# Patient Record
Sex: Male | Born: 1970
Health system: Southern US, Community
[De-identification: ages and names within clinical notes are randomized; demographics above are authoritative.]

## PROBLEM LIST (undated history)

## (undated) DIAGNOSIS — N189 Chronic kidney disease, unspecified: Secondary | ICD-10-CM

## (undated) DIAGNOSIS — F419 Anxiety disorder, unspecified: Secondary | ICD-10-CM

## (undated) DIAGNOSIS — I10 Essential (primary) hypertension: Secondary | ICD-10-CM

## (undated) DIAGNOSIS — T7840XA Allergy, unspecified, initial encounter: Secondary | ICD-10-CM

## (undated) DIAGNOSIS — C801 Malignant (primary) neoplasm, unspecified: Secondary | ICD-10-CM

## (undated) HISTORY — DX: Essential (primary) hypertension: I10

## (undated) HISTORY — DX: Allergy, unspecified, initial encounter: T78.40XA

## (undated) HISTORY — PX: OTHER SURGICAL HISTORY: SHX169

## (undated) NOTE — *Deleted (*Deleted)
NAME:  ADEEB KONECNY, MRN:  [id number], DOB:  [birth year], LOS: 0 ADMISSION DATE:  2020-06-02, CONSULTATION DATE:  *** REFERRING MD:  ***, CHIEF COMPLAINT:  ***   Brief History   ***  History of present illness   ***  Past Medical History  ***  Significant Hospital Events   ***  Consults:  ***  Procedures:  ***  Significant Diagnostic Tests:  ***  Micro Data:  ***  Antimicrobials:  ***   Interim history/subjective:  ***  Objective   Blood pressure (!) 98/41, pulse (!) 107, temperature (!) 93.7 F (34.3 C), resp. rate (!) 30, height 5\' 10"  (1.778 m), weight 101.2 kg, SpO2 100 %.    Vent Mode: PRVC FiO2 (%):  [100 %] 100 % Set Rate:  [30 bmp] 30 bmp Vt Set:  [580 mL] 580 mL PEEP:  [5 cmH20] 5 cmH20 Plateau Pressure:  [18 cmH20] 18 cmH20   Intake/Output Summary (Last 24 hours) at 02-Jun-2020 1812 Last data filed at 06/02/2020 1801 Gross per 24 hour  Intake 1019.6 ml  Output 190 ml  Net 829.6 ml   Filed Weights   02-Jun-2020 1606  Weight: 101.2 kg    Examination: General: *** HENT: *** Lungs: *** Cardiovascular: *** Abdomen: *** Extremities: *** Neuro: *** GU: ***  Resolved Hospital Problem list   ***  Assessment & Plan:  ***  Best practice:  Diet: *** Pain/Anxiety/Delirium protocol (if indicated): *** VAP protocol (if indicated): *** DVT prophylaxis: *** GI prophylaxis: *** Glucose control: *** Mobility: *** Code Status: *** Family Communication: *** Disposition:   Labs   CBC: Recent Labs  Lab 02-Jun-2020 1603 06-02-20 1619  WBC 21.5*  --   NEUTROABS 17.7*  --   HGB 6.8* 7.1*  HCT 24.1* 21.0*  MCV 86.7  --   PLT 65*  --     Basic Metabolic Panel: Recent Labs  Lab 06-02-20 1603 June 02, 2020 1619  NA 136 131*  K >7.5* >8.5*  CL 103  --   CO2 <7*  --   GLUCOSE 130*  --   BUN 140*  --   CREATININE 7.34*  --   CALCIUM 8.5*  --    GFR: Estimated Creatinine Clearance: 14.2 mL/min (A) (by C-G formula based on SCr of  7.34 mg/dL (H)). Recent Labs  Lab 02-Jun-2020 1603  WBC 21.5*  LATICACIDVEN >11.0*    Liver Function Tests: Recent Labs  Lab 2020/06/02 1603  AST 1,773*  ALT 1,932*  ALKPHOS 291*  BILITOT 0.9  PROT 5.6*  ALBUMIN 2.5*   No results for input(s): LIPASE, AMYLASE in the last 168 hours. Recent Labs  Lab 2020-06-02 1634  AMMONIA 84*    ABG    Component Value Date/Time   HCO3 3.6 (L) 06/02/2020 1619   TCO2 <5 (L) 06/02/2020 1619   ACIDBASEDEF 28.0 (H) Jun 02, 2020 1619   O2SAT 80.0 2020/06/02 1619     Coagulation Profile: No results for input(s): INR, PROTIME in the last 168 hours.  Cardiac Enzymes: No results for input(s): CKTOTAL, CKMB, CKMBINDEX, TROPONINI in the last 168 hours.  HbA1C: No results found for: HGBA1C  CBG: Recent Labs  Lab 06-02-20 1604  GLUCAP 95    Review of Systems:   ***  Past Medical History  He,  has a past medical history of Allergy, Anxiety, Cancer (HCC), Chronic kidney disease, and Hypertension.   Surgical History    Past Surgical History:  Procedure Laterality Date  . CYSTOSCOPY N/A 08/26/2019   Procedure:  Derinda Late;  Surgeon: Rene Paci, MD;  Location: [facility];  Service: Urology;  Laterality: N/A;  . ROBOT ASSISTED LAPAROSCOPIC NEPHRECTOMY Left 08/26/2019   Procedure: XI ROBOTIC ASSISTED LAPAROSCOPIC NEPHRECTOMY;  Surgeon: Rene Paci, MD;  Location: [facility];  Service: Urology;  Laterality: Left;  . surgery as a child     thinks matbe was a hernia repair as a premature baby      Social History   reports that he has never smoked. He has never used smokeless tobacco. He reports that he does not drink alcohol and does not use drugs.   Family History   His family history includes Colon polyps in his mother. There is no history of Colon cancer, Esophageal cancer, Prostate cancer, or Rectal cancer.   Allergies Allergies  Allergen Reactions  . Codeine Other (See Comments)    Nose bleeds     Home  Medications  Prior to Admission medications   Medication Sig Start Date End Date Taking? Authorizing Provider  acetaminophen (TYLENOL) 500 MG tablet Take 1,000 mg by mouth every 6 (six) hours as needed for mild pain.    [provider]  amLODipine (NORVASC) 10 MG tablet Take 1 tablet (10 mg total) by mouth daily. 11/23/19 06/15/20  Georgina Quint, MD  dexamethasone (DECADRON) 4 MG tablet Take 1 tablet (4 mg total) by mouth every 6 (six) hours. 04/23/20   Azucena Fallen, MD  lisinopril-hydrochlorothiazide (ZESTORETIC) 20-25 MG tablet TAKE 1 TABLET BY MOUTH DAILY 10/31/19   Georgina Quint, MD  metoprolol succinate (TOPROL-XL) 100 MG 24 hr tablet Take 1 tablet (100 mg total) by mouth daily. Take with or immediately following a meal. 11/23/19 04/22/20  Georgina Quint, MD  Multiple Vitamin (MULTIVITAMIN) LIQD Take 5 mLs by mouth daily.     [provider]  omeprazole (PRILOSEC) 20 MG capsule Take 1 capsule (20 mg total) by mouth daily. 04/26/20   Lonie Peak, MD     Critical care time: ***

---

## 1998-09-28 ENCOUNTER — Emergency Department (HOSPITAL_COMMUNITY): Admission: EM | Admit: 1998-09-28 | Discharge: 1998-09-28 | Payer: Self-pay

## 1998-11-03 ENCOUNTER — Encounter: Admission: RE | Admit: 1998-11-03 | Discharge: 1998-11-16 | Payer: Self-pay | Admitting: *Deleted

## 2018-11-14 ENCOUNTER — Emergency Department (HOSPITAL_COMMUNITY): Payer: BLUE CROSS/BLUE SHIELD

## 2018-11-14 ENCOUNTER — Other Ambulatory Visit: Payer: Self-pay

## 2018-11-14 ENCOUNTER — Encounter (HOSPITAL_COMMUNITY): Payer: Self-pay

## 2018-11-14 ENCOUNTER — Emergency Department (HOSPITAL_COMMUNITY)
Admission: EM | Admit: 2018-11-14 | Discharge: 2018-11-14 | Disposition: A | Discharge status: Home / Self Care | Payer: BLUE CROSS/BLUE SHIELD | Attending: Emergency Medicine | Admitting: Emergency Medicine

## 2018-11-14 DIAGNOSIS — R319 Hematuria, unspecified: Secondary | ICD-10-CM | POA: Diagnosis not present

## 2018-11-14 DIAGNOSIS — N059 Unspecified nephritic syndrome with unspecified morphologic changes: Secondary | ICD-10-CM | POA: Diagnosis not present

## 2018-11-14 DIAGNOSIS — N281 Cyst of kidney, acquired: Secondary | ICD-10-CM | POA: Diagnosis not present

## 2018-11-14 DIAGNOSIS — I10 Essential (primary) hypertension: Secondary | ICD-10-CM

## 2018-11-14 DIAGNOSIS — R030 Elevated blood-pressure reading, without diagnosis of hypertension: Secondary | ICD-10-CM | POA: Diagnosis not present

## 2018-11-14 DIAGNOSIS — N009 Acute nephritic syndrome with unspecified morphologic changes: Secondary | ICD-10-CM | POA: Diagnosis not present

## 2018-11-14 LAB — URINALYSIS, ROUTINE W REFLEX MICROSCOPIC
Bilirubin Urine: NEGATIVE
Glucose, UA: NEGATIVE mg/dL
Ketones, ur: NEGATIVE mg/dL
Leukocytes,Ua: NEGATIVE
Nitrite: NEGATIVE
Protein, ur: 100 mg/dL — AB
RBC / HPF: >50 RBC/hpf — ABNORMAL HIGH (ref 0–5)
Specific Gravity, Urine: 1.02 (ref 1.005–1.030)
pH: 6 (ref 5.0–8.0)

## 2018-11-14 LAB — CBC
HCT: 44.8 % (ref 39.0–52.0)
Hemoglobin: 14.3 g/dL (ref 13.0–17.0)
MCH: 26.8 pg (ref 26.0–34.0)
MCHC: 31.9 g/dL (ref 30.0–36.0)
MCV: 83.9 fL (ref 80.0–100.0)
Platelets: 361 10*3/uL (ref 150–400)
RBC: 5.34 MIL/uL (ref 4.22–5.81)
RDW: 14.2 % (ref 11.5–15.5)
WBC: 8.2 10*3/uL (ref 4.0–10.5)
nRBC: 0 % (ref 0.0–0.2)

## 2018-11-14 LAB — BASIC METABOLIC PANEL
Anion gap: 12 (ref 5–15)
BUN: 12 mg/dL (ref 6–20)
CO2: 22 mmol/L (ref 22–32)
Calcium: 9.6 mg/dL (ref 8.9–10.3)
Chloride: 103 mmol/L (ref 98–111)
Creatinine, Ser: 1.27 mg/dL — ABNORMAL HIGH (ref 0.61–1.24)
GFR calc Af Amer: >60 mL/min (ref >60)
GFR calc non Af Amer: >60 mL/min (ref >60)
Glucose, Bld: 104 mg/dL — ABNORMAL HIGH (ref 70–99)
Potassium: 3.9 mmol/L (ref 3.5–5.1)
Sodium: 137 mmol/L (ref 135–145)

## 2018-11-14 LAB — TROPONIN I: Troponin I: <0.03 ng/mL (ref <0.03)

## 2018-11-14 IMAGING — DX PORTABLE CHEST - 1 VIEW
1 series · 1 of 1 positions shown · non-contrast
Comparison: None.

CLINICAL DATA: Hypertension

EXAM:
PORTABLE CHEST 1 VIEW

[chest ap]
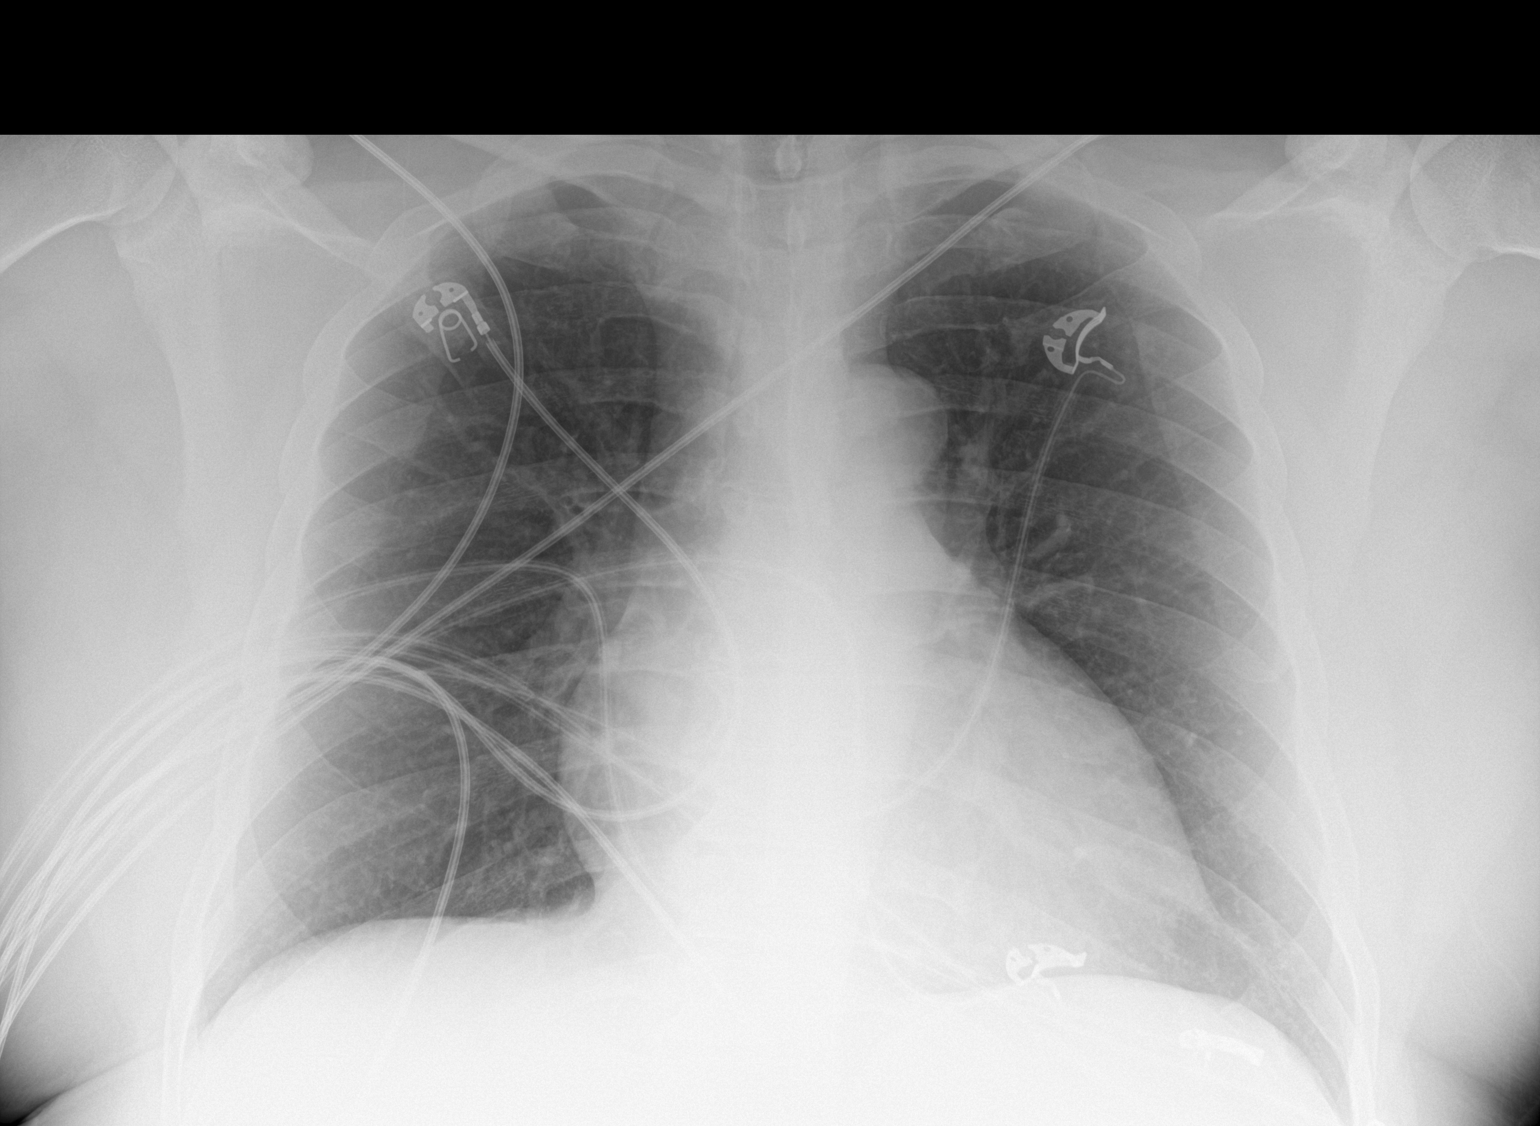

[1 of 1 positions shown; findings below may reference images not displayed]

FINDINGS: The heart size and mediastinal contours are within normal limits.
Both lungs are clear. The visualized skeletal structures are
unremarkable.
IMPRESSION: No active disease.

## 2018-11-14 IMAGING — US US RENAL
1 series · 14 of 25 positions shown · non-contrast
Comparison: None.

CLINICAL DATA: Hypertension and hematuria.

EXAM:
RENAL / URINARY TRACT ULTRASOUND COMPLETE

[Series 1: us renal · 42 acquisitions, 14 frames shown]
[im 1/42]
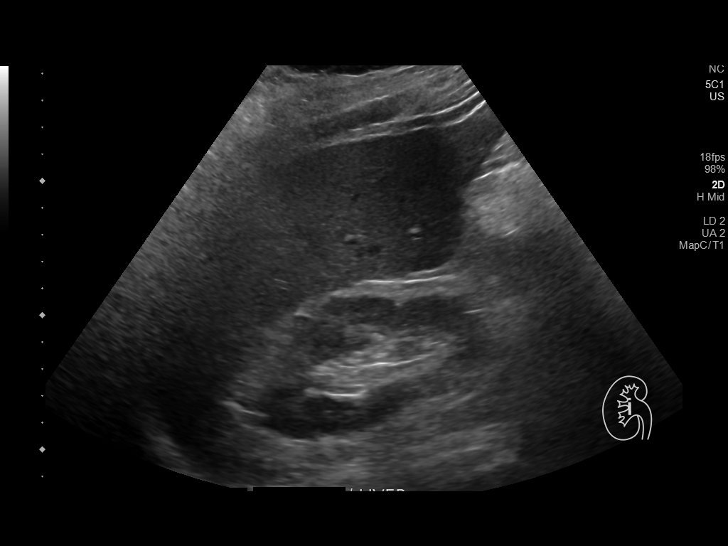
[im 4/42]
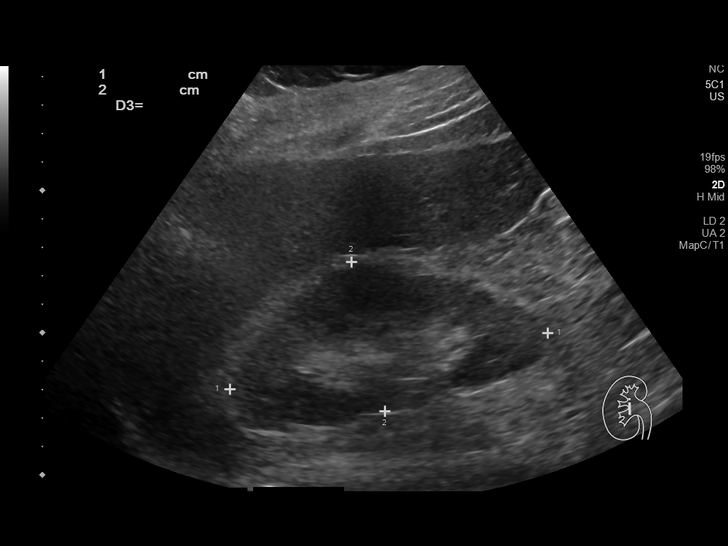
[im 7/42]
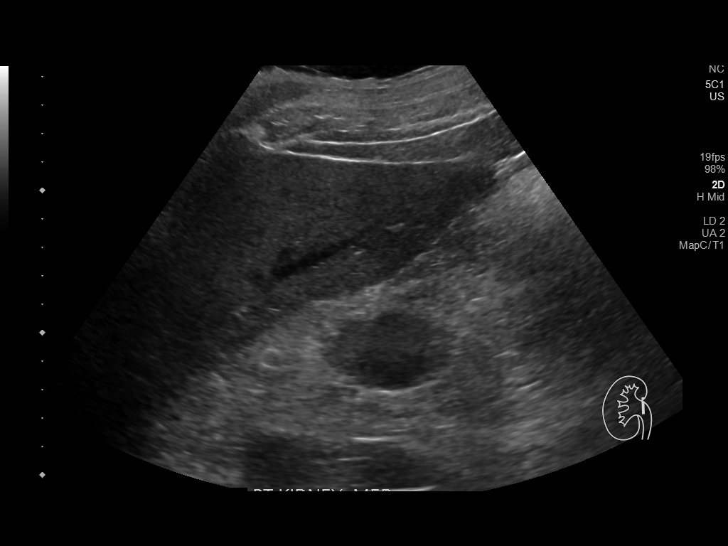
[im 11/42]
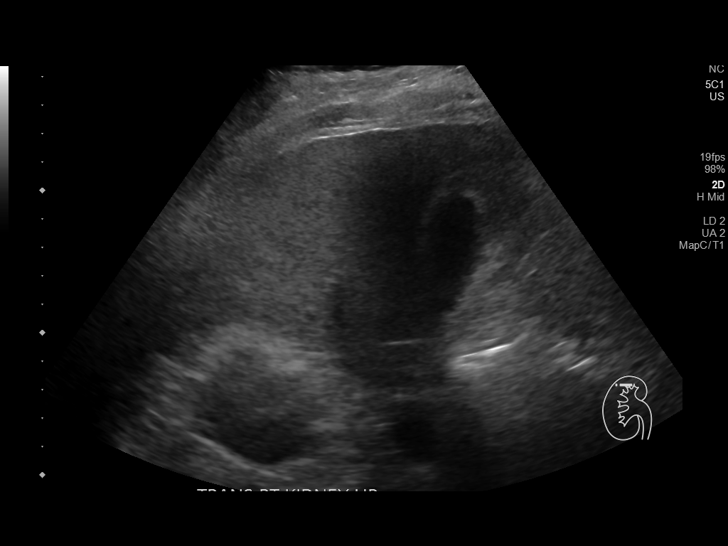
[im 14/42]
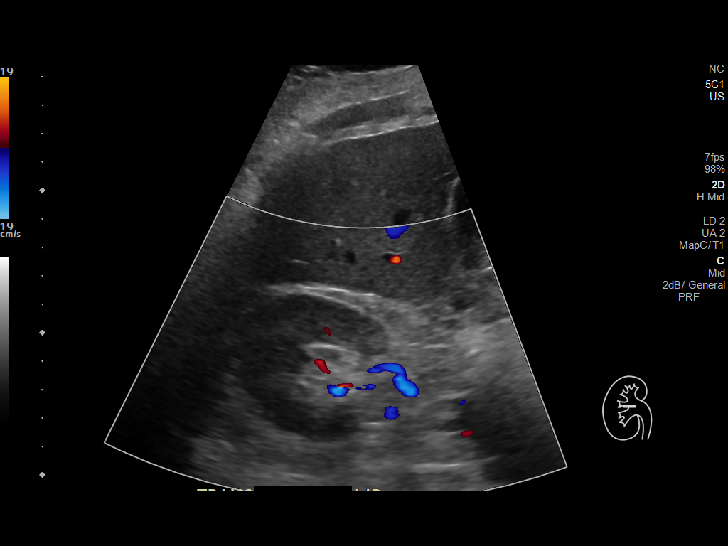
[im 16/42]
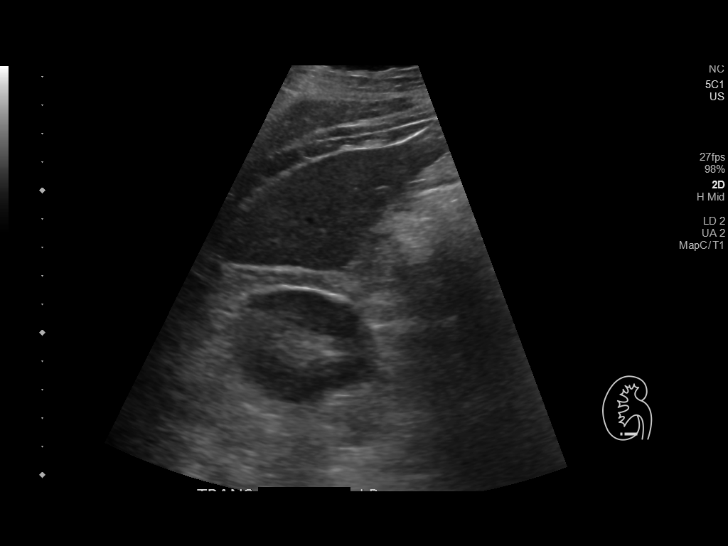
[im 19/42]
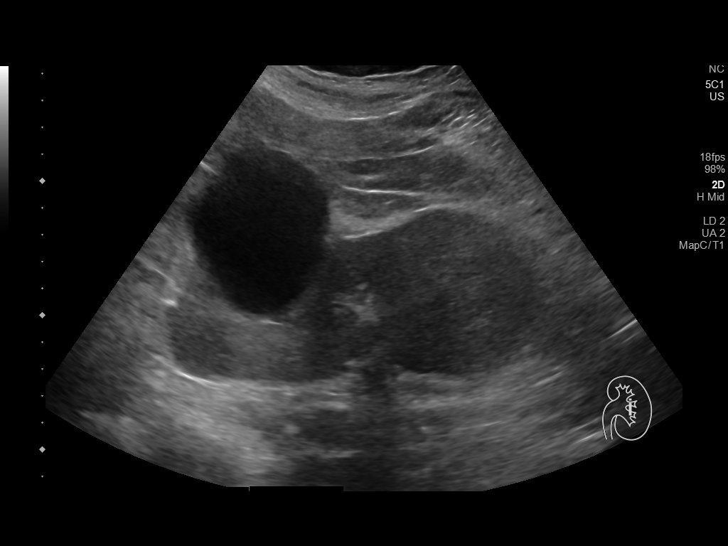
[im 23/42]
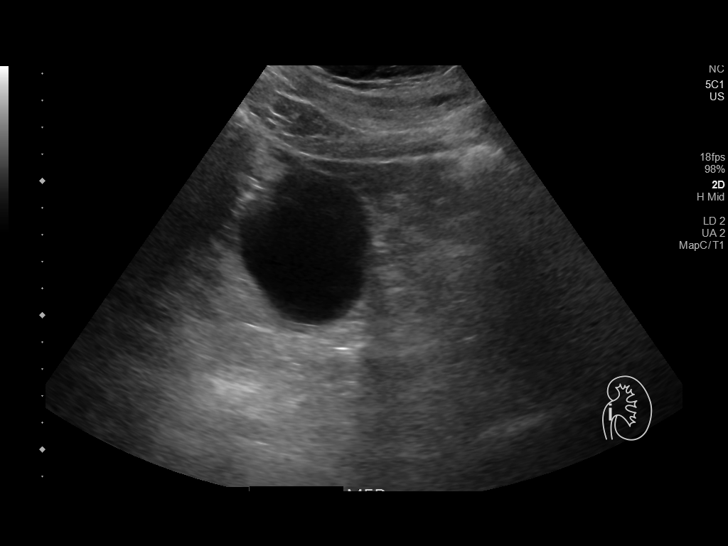
[im 26/42]
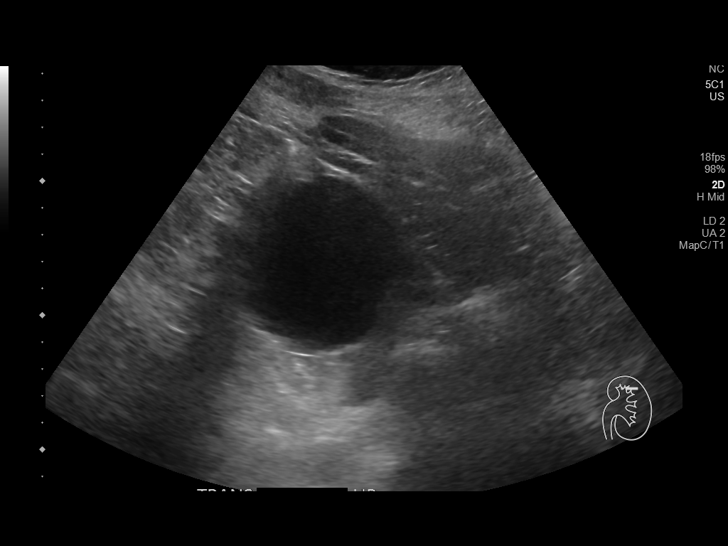
[im 28/42]
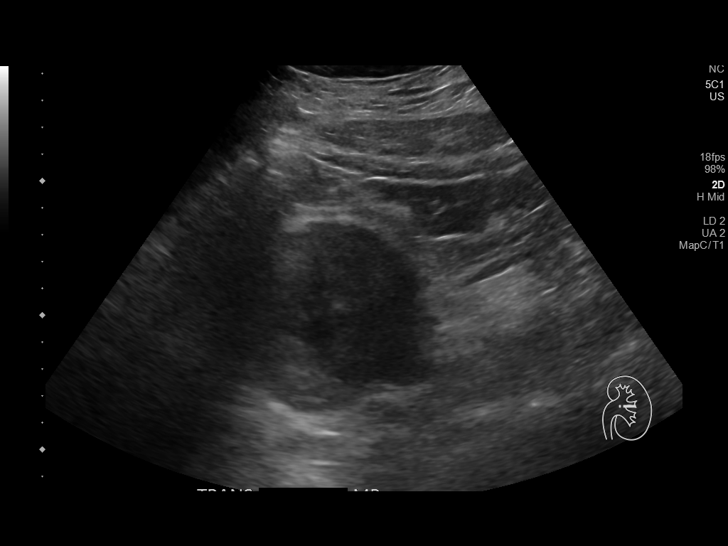
[im 31/42]
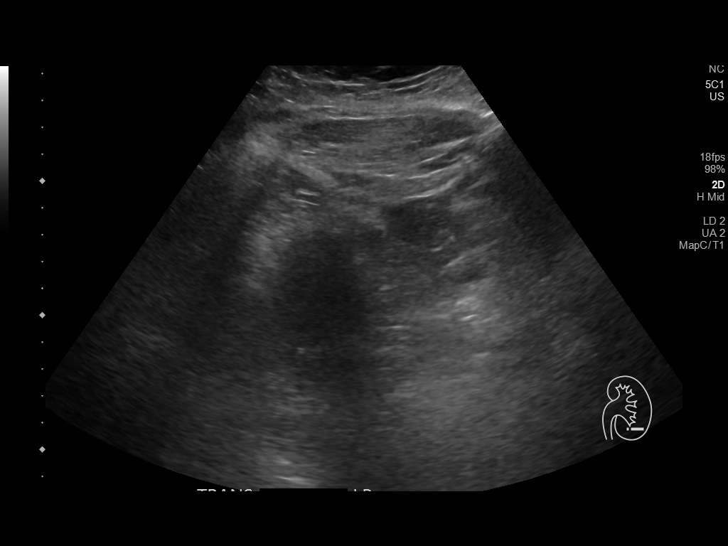
[im 35/42]
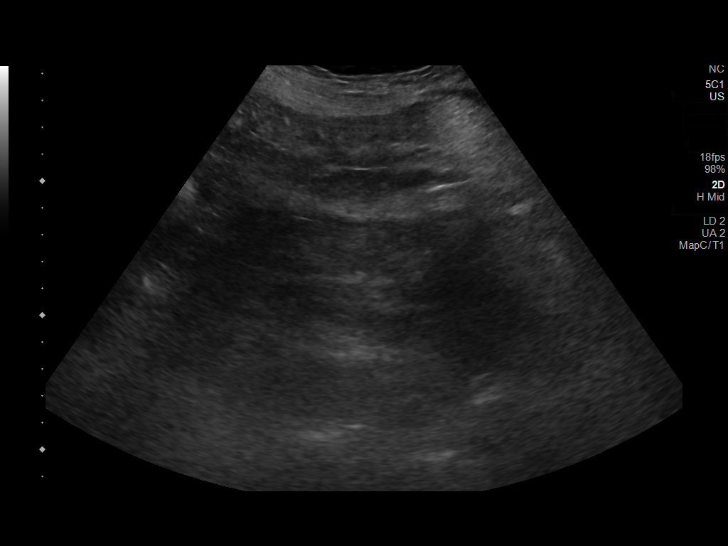
[im 38/42]
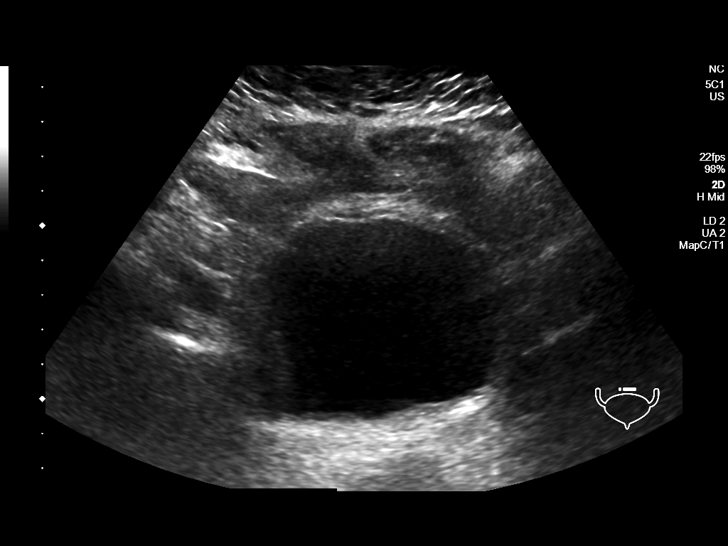
[im 42/42]
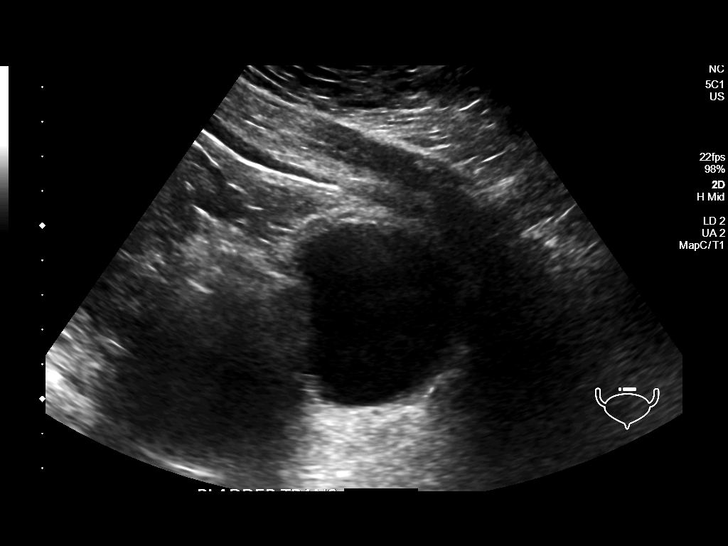

[14 of 25 positions shown; findings below may reference images not displayed]

FINDINGS: Right Kidney:

Renal measurements: 11.3 x 5.4 x 5.4 cm = volume: 172 mL .
Echogenicity within normal limits. No mass or hydronephrosis
visualized.

Left Kidney:

Renal measurements: 14.2 x 5.1 x 6.6 cm = volume: 249 mL. Normal
echogenicity. No mass or hydronephrosis. 6 cm cyst at the upper
pole.

Bladder:

Appears normal for degree of bladder distention.
IMPRESSION: No cause of hematuria identified. Normal size kidneys. No
hydronephrosis. 6 cm simple cyst at the upper pole the left kidney.

## 2018-11-14 MED ORDER — LABETALOL HCL 100 MG PO TABS: 100.0000 mg | ORAL_TABLET | Freq: Two times a day (BID) | ORAL | 0 refills | Status: DC

## 2018-11-14 MED ORDER — LABETALOL HCL 5 MG/ML IV SOLN
20.0000 mg | Freq: Once | INTRAVENOUS | Status: AC
  Administered 2018-11-14: 12:00:00 20 mg via INTRAVENOUS
  Filled 2018-11-14: qty 4

## 2018-11-14 MED ORDER — HYDROCHLOROTHIAZIDE 25 MG PO TABS: 25.0000 mg | ORAL_TABLET | Freq: Every day | ORAL | 0 refills | Status: DC

## 2018-11-14 NOTE — ED Provider Notes (Signed)
CC hematuria, found to have HTN at outside office. Given Labetalol for BP with improvement. Nephrology consulted for nephritic syndrome, requested [place] for OP follow up unless patient has hydronephrosis or needs admission. Rx for Lisinopril/HCTZ  Physical Exam  BP (!) 193/112   Pulse 84   Temp 98.6 F (37 C) (Oral)   Resp 19   SpO2 100%   Physical Exam  ED Course/Procedures   Clinical Course as of Nov 14 1614  Fri Nov 14, 2018  1408 Discussed with Dr Moshe Cipro.  Will check a renal ultrasound.  Pt should be able to go home on BP meds and close follow up   [JK]    Clinical Course User Index [JK] Dorie Rank, MD    Procedures  MDM  [place] with renal cyst, no hydronephrosis. Discussed results with patient, patient is scheduled for follow up on 5/19, he is aware of this appointment. Patient to take BP meds as prescribed, has a BP cuff at home to monitor his BP, record results and take to follow up appointment.       Tacy Learn, PA-C 11/14/18 1616    Dorie Rank, MD 11/16/18 1537

## 2018-11-14 NOTE — TOC Transition Note (Addendum)
Transition of Care [facility]) - CM/SW Discharge Note   Patient Details  Name: Marcus Callahan MRN: [id number] Date of Birth: 1971/03/11  [facility] CM/SW Contact:  Erenest Rasher, RN Phone Number: 11/14/2018, 12:43 PM   Clinical Narrative:   [facility] TOC CM -referral PCP, HTN  Spoke to pt and states he does not have a PCP, states he was going to Primary Care at [facility] but sent to come to [facility] ED due to elevated BP. Arranged appt with Primary Care at [facility] for 11/18/2018 at 9:20 am. Discussed with patient diet, blood pressure cuff, medications, exercise, and scale. Encouraged him to check with MD about exercising. States he walks daily. Pt will purchase blood pressure cuff and scale. Explained to report his numbers to his PCP at his appt. Explained importance of taking meds as prescribed and reporting any side effects to his physician.   Final next level of care: Home/Self Care Barriers to Discharge: No Barriers Identified   Patient Goals and CMS Choice        Discharge Placement                       Discharge Plan and Services    PCP appointment                                 Social Determinants of Health (SDOH) Interventions     Readmission Risk Interventions No flowsheet data found.

## 2018-11-14 NOTE — ED Notes (Signed)
Case Manager @ bedside.

## 2018-11-14 NOTE — ED Notes (Signed)
ED Provider at bedside. 

## 2018-11-14 NOTE — ED Notes (Signed)
Xray bedside.

## 2018-11-14 NOTE — ED Notes (Signed)
Ultrasound bedside.

## 2018-11-14 NOTE — ED Triage Notes (Signed)
Pt went to Urgent Care clinic, was told to come to ED for elevated BP and hematuria. Pt states today is the first time he has seen blood in urine, denies painful urination.

## 2018-11-14 NOTE — Discharge Instructions (Addendum)
Follow up with Nephrology as scheduled. Return to ER for new or worsening symptoms. Take Labetalol and HCTZ as prescribed for blood pressure control. Check your blood pressure three times daily, record results and take with you to your follow up appointment.

## 2018-11-14 NOTE — ED Provider Notes (Signed)
[place] DEPT Provider Note   CSN: [id number] Arrival date & time: 11/14/18  1118    History   Chief Complaint Chief Complaint  Patient presents with  . Hematuria  . Hypertension    HPI Marcus Callahan is a 48 y.o. male.     HPI Patient presents to the emergency room for evaluation of hypertension and hematuria.  Initially noticed that he had blood in his urine this morning.  Patient states he was at work today and when he went to urinate at the bathroom he noticed the blood.  He denies having any other symptoms.  He is not had any pain with urination.  He denies any back pain or abdominal pain.  He is never had any issues like this before.  He went to an urgent care to have that evaluated.  While he was there they took his blood pressure and noted he was extremely hypertensive.  His blood pressure was over 742 systolic and over 595 systolic.  They recommended he come to the ED for further evaluation.  Patient denies having any trouble with chest pain or shortness of breath.  No trouble with headache or blurred vision.  He states he has been told this past his blood pressure has been high but he has never had a primary care doctor and has never been on any medications. History reviewed. No pertinent past medical history.  There are no active problems to display for this patient.   History reviewed. No pertinent surgical history.      Home Medications    Prior to Admission medications   Not on File    Family History History reviewed. No pertinent family history.  Social History Social History   Tobacco Use  . Smoking status: Not on file  Substance Use Topics  . Alcohol use: Not on file  . Drug use: Not on file     Allergies   Codeine   Review of Systems Review of Systems  All other systems reviewed and are negative.    Physical Exam Updated Vital Signs BP (!) 222/131   Pulse 95   Temp 98.6 F (37 C) (Oral)   Resp  18   SpO2 100%   Physical Exam Vitals signs and nursing note reviewed.  Constitutional:      General: He is not in acute distress.    Appearance: He is well-developed.  HENT:     Head: Normocephalic and atraumatic.     Right Ear: External ear normal.     Left Ear: External ear normal.  Eyes:     General: No scleral icterus.       Right eye: No discharge.        Left eye: No discharge.     Conjunctiva/sclera: Conjunctivae normal.  Neck:     Musculoskeletal: Neck supple.     Trachea: No tracheal deviation.  Cardiovascular:     Rate and Rhythm: Normal rate and regular rhythm.  Pulmonary:     Effort: Pulmonary effort is normal. No respiratory distress.     Breath sounds: Normal breath sounds. No stridor. No wheezing or rales.  Abdominal:     General: Bowel sounds are normal. There is no distension.     Palpations: Abdomen is soft.     Tenderness: There is no abdominal tenderness. There is no guarding or rebound.  Musculoskeletal:        General: No tenderness.  Skin:    General: Skin is warm  and dry.     Findings: No rash.  Neurological:     Mental Status: He is alert.     Cranial Nerves: No cranial nerve deficit (no facial droop, extraocular movements intact, no slurred speech).     Sensory: No sensory deficit.     Motor: No abnormal muscle tone or seizure activity.     Coordination: Coordination normal.      ED Treatments / Results  Labs (all labs ordered are listed, but only abnormal results are displayed) Labs Reviewed  URINALYSIS, ROUTINE W REFLEX MICROSCOPIC  CBC  BASIC METABOLIC PANEL  TROPONIN I    EKG EKG Interpretation  Date/Time:  Friday Nov 14 2018 11:45:43 EDT Ventricular Rate:  86 PR Interval:    QRS Duration: 101 QT Interval:  397 QTC Calculation: 475 R Axis:   -37 Text Interpretation:  Sinus rhythm Probable left atrial enlargement Left axis deviation No old tracing to compare Confirmed by Dorie Rank [phone]) on 11/14/2018 11:59:42 AM Also  confirmed by Dorie Rank [phone]), editor Philomena Doheny [phone])  on 11/15/2018 7:06:31 AM   Radiology US Renal  Result Date: 11/14/2018 CLINICAL DATA:  Hypertension and hematuria. EXAM: RENAL / URINARY TRACT ULTRASOUND COMPLETE COMPARISON:  None. FINDINGS: Right Kidney: Renal measurements: 11.3 x 5.4 x 5.4 cm = volume: 172 mL . Echogenicity within normal limits. No mass or hydronephrosis visualized. Left Kidney: Renal measurements: 14.2 x 5.1 x 6.6 cm = volume: 249 mL. Normal echogenicity. No mass or hydronephrosis. 6 cm cyst at the upper pole. Bladder: Appears normal for degree of bladder distention. IMPRESSION: No cause of hematuria identified. Normal size kidneys. No hydronephrosis. 6 cm simple cyst at the upper pole the left kidney. Electronically Signed   By: Nelson Chimes M.D.   On: 11/14/2018 15:50    Procedures Procedures (including critical care time)  Medications Ordered in ED Medications - No data to display   Initial Impression / Assessment and Plan / ED Course  I have reviewed the triage vital signs and the nursing notes.  Pertinent labs & imaging results that were available during my care of the patient were reviewed by me and considered in my medical decision making (see chart for details).  Clinical Course as of Nov 16 1527  Fri Nov 14, 2018  1408 Discussed with Dr Moshe Cipro.  Will check a renal ultrasound.  Pt should be able to go home on BP meds and close follow up   [JK]    Clinical Course User Index [JK] Dorie Rank, MD    Pt presented with HTN and hematuria.  No pain, likely medical etiology. Nephritic syndrome a concern.  BP improved with treatment in the ED.  Plan on DC home with BP medications and outpt follow up.    Final Clinical Impressions(s) / ED Diagnoses   Final diagnoses:  Nephritic syndrome  Hypertension, unspecified type    ED Discharge Orders         Ordered    labetalol (NORMODYNE) 100 MG tablet  2 times daily     11/14/18 1607     hydrochlorothiazide (HYDRODIURIL) 25 MG tablet  Daily     11/14/18 1607           Dorie Rank, MD 11/16/18 1530

## 2018-11-18 ENCOUNTER — Ambulatory Visit: Payer: BLUE CROSS/BLUE SHIELD | Admitting: Emergency Medicine

## 2018-11-18 ENCOUNTER — Encounter: Payer: Self-pay | Admitting: Emergency Medicine

## 2018-11-18 ENCOUNTER — Other Ambulatory Visit: Payer: Self-pay

## 2018-11-18 VITALS — BP 140/94 | HR 79 | Temp 98.8°F | Resp 16 | Ht 69.0 in | Wt 247.8 lb

## 2018-11-18 DIAGNOSIS — R319 Hematuria, unspecified: Secondary | ICD-10-CM

## 2018-11-18 DIAGNOSIS — Z23 Encounter for immunization: Secondary | ICD-10-CM | POA: Diagnosis not present

## 2018-11-18 DIAGNOSIS — I10 Essential (primary) hypertension: Secondary | ICD-10-CM | POA: Diagnosis not present

## 2018-11-18 DIAGNOSIS — Z1211 Encounter for screening for malignant neoplasm of colon: Secondary | ICD-10-CM

## 2018-11-18 LAB — POCT URINALYSIS DIP (MANUAL ENTRY)
Bilirubin, UA: NEGATIVE
Glucose, UA: NEGATIVE mg/dL
Ketones, POC UA: NEGATIVE mg/dL
Leukocytes, UA: NEGATIVE
Nitrite, UA: NEGATIVE
Protein Ur, POC: 30 mg/dL — AB
Spec Grav, UA: 1.025 (ref 1.010–1.025)
Urobilinogen, UA: 0.2 E.U./dL
pH, UA: 5.5 (ref 5.0–8.0)

## 2018-11-18 MED ORDER — LISINOPRIL-HYDROCHLOROTHIAZIDE 20-25 MG PO TABS: 1.0000 | ORAL_TABLET | Freq: Every day | ORAL | 3 refills | Status: DC

## 2018-11-18 NOTE — Assessment & Plan Note (Signed)
Stop hydrochlorothiazide start lisinopril-hydrochlorothiazide 20-25 mg daily.  Continue labetalol 100 mg twice a day. DASHDiet and exercise recommended. Follow-up in 4 weeks.

## 2018-11-18 NOTE — Progress Notes (Signed)
11/14/2018 ED visit: CC hematuria, found to have HTN at outside office. Given Labetalol for BP with improvement. Nephrology consulted for nephritic syndrome, requested [place] for OP follow up unless patient has hydronephrosis or needs admission. Rx for Lisinopril/HCTZ  [place] with renal cyst, no hydronephrosis. Discussed results with patient, patient is scheduled for follow up on 5/19, he is aware of this appointment. Patient to take BP meds as prescribed, has a BP cuff at home to monitor his BP, record results and take to follow up appointment BP Readings from Last 3 Encounters:  11/18/18 (!) 158/112  11/14/18 (!) 193/112  US Renal  Result Date: 11/14/2018 CLINICAL DATA:  Hypertension and hematuria. EXAM: RENAL / URINARY TRACT ULTRASOUND COMPLETE COMPARISON:  None. FINDINGS: Right Kidney: Renal measurements: 11.3 x 5.4 x 5.4 cm = volume: 172 mL . Echogenicity within normal limits. No mass or hydronephrosis visualized. Left Kidney: Renal measurements: 14.2 x 5.1 x 6.6 cm = volume: 249 mL. Normal echogenicity. No mass or hydronephrosis. 6 cm cyst at the upper pole. Bladder: Appears normal for degree of bladder distention. IMPRESSION: No cause of hematuria identified. Normal size kidneys. No hydronephrosis. 6 cm simple cyst at the upper pole the left kidney. Electronically Signed   By: Nelson Chimes M.D.   On: 11/14/2018 15:50   Dg Chest Portable 1 View  Result Date: 11/14/2018 CLINICAL DATA:  Hypertension EXAM: PORTABLE CHEST 1 VIEW COMPARISON:  None. FINDINGS: The heart size and mediastinal contours are within normal limits. Both lungs are clear. The visualized skeletal structures are unremarkable. IMPRESSION: No active disease. Electronically Signed   By: Inez Catalina M.D.   On: 11/14/2018 12:17        Lab Results  Component Value Date   CREATININE 1.27 (H) 11/14/2018   BUN 12 11/14/2018   NA 137 11/14/2018   K 3.9 11/14/2018   CL 103 11/14/2018   CO2 22 11/14/2018   GFR calc Af Amer >60 mL/min  >60   Marcus Callahan 48 y.o.   Chief Complaint  Patient presents with  . Establish Care  . Hypertension    follow up after ED visit [facility] 11/14/2018 for htn and blood in urine    HISTORY OF PRESENT ILLNESS: This is a 48 y.o. male here to establish care with me.  Recently seen in the emergency room on 11/14/2018 for hematuria and uncontrolled hypertension.  Normal kidney function test with normal GFR.  Unremarkable ultrasound of his kidneys.  Started on labetalol 100 mg twice a day and hydrochlorothiazide 25 mg a day.  Blood pressures at home have been 170/115 average.  Asymptomatic.  Denies headache, syncope, chest pain, difficulty breathing, nausea or vomiting, blurred vision, or any other significant symptoms.  Denies chest pain on exertion.  Denies dyspnea on exertion.  No symptoms of angina. Wt Readings from Last 3 Encounters:  11/18/18 247 lb 12.8 oz (112.4 kg)     HPI   Prior to Admission medications   Medication Sig Start Date End Date Taking? Authorizing Provider  hydrochlorothiazide (HYDRODIURIL) 25 MG tablet Take 1 tablet (25 mg total) by mouth daily for 30 days. 11/14/18 12/14/18 Yes Tacy Learn, PA-C  labetalol (NORMODYNE) 100 MG tablet Take 1 tablet (100 mg total) by mouth 2 (two) times daily for 30 days. 11/14/18 12/14/18 Yes Tacy Learn, PA-C    Allergies  Allergen Reactions  . Codeine Other (See Comments)    Nose bleeds    There are no active problems to  display for this patient.   History reviewed. No pertinent past medical history.  History reviewed. No pertinent surgical history.  Social History   Socioeconomic History  . Marital status: Single    Spouse name: Not on file  . Number of children: Not on file  . Years of education: Not on file  . Highest education level: Not on file  Occupational History  . Not on file  Social Needs  . Financial resource strain: Not on file  . Food insecurity:    Worry: Not on file    Inability: Not  on file  . Transportation needs:    Medical: Not on file    Non-medical: Not on file  Tobacco Use  . Smoking status: Never Smoker  . Smokeless tobacco: Never Used  Substance and Sexual Activity  . Alcohol use: Yes    Comment: occassionaly  . Drug use: Never  . Sexual activity: Not on file  Lifestyle  . Physical activity:    Days per week: Not on file    Minutes per session: Not on file  . Stress: Not on file  Relationships  . Social connections:    Talks on phone: Not on file    Gets together: Not on file    Attends religious service: Not on file    Active member of club or organization: Not on file    Attends meetings of clubs or organizations: Not on file    Relationship status: Not on file  . Intimate partner violence:    Fear of current or ex partner: Not on file    Emotionally abused: Not on file    Physically abused: Not on file    Forced sexual activity: Not on file  Other Topics Concern  . Not on file  Social History Narrative  . Not on file    History reviewed. No pertinent family history.   Review of Systems  Constitutional: Negative.  Negative for chills and fever.  HENT: Negative.  Negative for sinus pain and sore throat.   Eyes: Negative.  Negative for blurred vision and double vision.  Respiratory: Negative.   Cardiovascular: Negative.  Negative for chest pain, palpitations and leg swelling.  Gastrointestinal: Negative.  Negative for abdominal pain, diarrhea, nausea and vomiting.  Genitourinary: Positive for hematuria (Better today).  Musculoskeletal: Negative for myalgias.  Skin: Negative.  Negative for rash.  Neurological: Negative.  Negative for dizziness and headaches.  Endo/Heme/Allergies: Negative.   All other systems reviewed and are negative.  Vitals:   11/18/18 0915 11/18/18 1007  BP: (!) 158/112 (!) 140/94  Pulse: 79   Resp: 16   Temp: 98.8 F (37.1 C)   SpO2: 100%      Physical Exam Vitals signs reviewed.  Constitutional:       Appearance: Normal appearance.  HENT:     Head: Normocephalic and atraumatic.  Eyes:     Extraocular Movements: Extraocular movements intact.     Conjunctiva/sclera: Conjunctivae normal.     Pupils: Pupils are equal, round, and reactive to light.  Neck:     Musculoskeletal: Normal range of motion and neck supple.     Vascular: No carotid bruit.  Cardiovascular:     Rate and Rhythm: Normal rate and regular rhythm.     Heart sounds: Normal heart sounds. No murmur.  Pulmonary:     Effort: Pulmonary effort is normal.     Breath sounds: Normal breath sounds.  Abdominal:     General: Abdomen is  flat. Bowel sounds are normal.     Tenderness: There is no abdominal tenderness.  Musculoskeletal:        General: No swelling or tenderness.     Right lower leg: No edema.     Left lower leg: No edema.  Lymphadenopathy:     Cervical: No cervical adenopathy.  Skin:    General: Skin is warm and dry.     Capillary Refill: Capillary refill takes less than 2 seconds.  Neurological:     General: No focal deficit present.     Mental Status: He is alert and oriented to person, place, and time.  Psychiatric:        Mood and Affect: Mood normal.        Behavior: Behavior normal.     Results for orders placed or performed in visit on 11/18/18 (from the past 24 hour(s))  POCT urinalysis dipstick     Status: Abnormal   Collection Time: 11/18/18  9:49 AM  Result Value Ref Range   Color, UA yellow yellow   Clarity, UA clear clear   Glucose, UA negative negative mg/dL   Bilirubin, UA negative negative   Ketones, POC UA negative negative mg/dL   Spec Grav, UA 1.025 1.010 - 1.025   Blood, UA moderate (A) negative   pH, UA 5.5 5.0 - 8.0   Protein Ur, POC =30 (A) negative mg/dL   Urobilinogen, UA 0.2 0.2 or 1.0 E.U./dL   Nitrite, UA Negative Negative   Leukocytes, UA Negative Negative     A total of 30 minutes was spent in the room with the patient, greater than 50% of which was in  counseling/coordination of care regarding hypertension and associated cardiovascular risks.  Diet and nutrition, in particular DASH diet discussed with patient.  Target weight set of 220 pounds.  Change of medication discussed with patient.  We will start lisinopril/HCTZ and continue labetalol.  Recent blood work results and renal imaging reviewed with patient in the room.  We talked about the importance of exercise as well as prognosis and need for follow-up in 4 weeks.   ASSESSMENT & PLAN: Marcus Callahan was seen today for establish care and hypertension.  Diagnoses and all orders for this visit:  Uncontrolled hypertension -     lisinopril-hydrochlorothiazide (ZESTORETIC) 20-25 MG tablet; Take 1 tablet by mouth daily.  Hematuria, unspecified type -     POCT urinalysis dipstick  Need for diphtheria-tetanus-pertussis (Tdap) vaccine -     Tdap vaccine greater than or equal to 7yo IM  Colon cancer screening -     Ambulatory referral to Gastroenterology   Uncontrolled hypertension Stop hydrochlorothiazide start lisinopril-hydrochlorothiazide 20-25 mg daily.  Continue labetalol 100 mg twice a day. DASHDiet and exercise recommended. Follow-up in 4 weeks.   Patient Instructions    Stop hydrochlorothiazide and start taking lisinopril/hydrochlorothiazide as prescribed today. DASH Eating Plan DASH stands for "Dietary Approaches to Stop Hypertension." The DASH eating plan is a healthy eating plan that has been shown to reduce high blood pressure (hypertension). It may also reduce your risk for type 2 diabetes, heart disease, and stroke. The DASH eating plan may also help with weight loss. What are tips for following this plan?  General guidelines  Avoid eating more than 2,300 mg (milligrams) of salt (sodium) a day. If you have hypertension, you may need to reduce your sodium intake to 1,500 mg a day.  Limit alcohol intake to no more than 1 drink a day for nonpregnant women and  2 drinks a day for  men. One drink equals 12 oz of beer, 5 oz of wine, or 1 oz of hard liquor.  Work with your health care provider to maintain a healthy body weight or to lose weight. Ask what an ideal weight is for you.  Get at least 30 minutes of exercise that causes your heart to beat faster (aerobic exercise) most days of the week. Activities may include walking, swimming, or biking.  Work with your health care provider or diet and nutrition specialist (dietitian) to adjust your eating plan to your individual calorie needs. Reading food labels   Check food labels for the amount of sodium per serving. Choose foods with less than 5 percent of the Daily Value of sodium. Generally, foods with less than 300 mg of sodium per serving fit into this eating plan.  To find whole grains, look for the word "whole" as the first word in the ingredient list. Shopping  Buy products labeled as "low-sodium" or "no salt added."  Buy fresh foods. Avoid canned foods and premade or frozen meals. Cooking  Avoid adding salt when cooking. Use salt-free seasonings or herbs instead of table salt or sea salt. Check with your health care provider or pharmacist before using salt substitutes.  Do not fry foods. Cook foods using healthy methods such as baking, boiling, grilling, and broiling instead.  Cook with heart-healthy oils, such as olive, canola, soybean, or sunflower oil. Meal planning  Eat a balanced diet that includes: ? 5 or more servings of fruits and vegetables each day. At each meal, try to fill half of your plate with fruits and vegetables. ? Up to 6-8 servings of whole grains each day. ? Less than 6 oz of lean meat, poultry, or fish each day. A 3-oz serving of meat is about the same size as a deck of cards. One egg equals 1 oz. ? 2 servings of low-fat dairy each day. ? A serving of nuts, seeds, or beans 5 times each week. ? Heart-healthy fats. Healthy fats called Omega-3 fatty acids are found in foods such as  flaxseeds and coldwater fish, like sardines, salmon, and mackerel.  Limit how much you eat of the following: ? Canned or prepackaged foods. ? Food that is high in trans fat, such as fried foods. ? Food that is high in saturated fat, such as fatty meat. ? Sweets, desserts, sugary drinks, and other foods with added sugar. ? Full-fat dairy products.  Do not salt foods before eating.  Try to eat at least 2 vegetarian meals each week.  Eat more home-cooked food and less restaurant, buffet, and fast food.  When eating at a restaurant, ask that your food be prepared with less salt or no salt, if possible. What foods are recommended? The items listed may not be a complete list. Talk with your dietitian about what dietary choices are best for you. Grains Whole-grain or whole-wheat bread. Whole-grain or whole-wheat pasta. Brown rice. [place]. Bulgur. Whole-grain and low-sodium cereals. Pita bread. Low-fat, low-sodium crackers. Whole-wheat flour tortillas. Vegetables Fresh or frozen vegetables (raw, steamed, roasted, or grilled). Low-sodium or reduced-sodium tomato and vegetable juice. Low-sodium or reduced-sodium tomato sauce and tomato paste. Low-sodium or reduced-sodium canned vegetables. Fruits All fresh, dried, or frozen fruit. Canned fruit in natural juice (without added sugar). Meat and other protein foods Skinless chicken or [place]. Ground chicken or [place]. Pork with fat trimmed off. Fish and seafood. Egg whites. Dried beans, peas, or lentils. Unsalted nuts, nut  butters, and seeds. Unsalted canned beans. Lean cuts of beef with fat trimmed off. Low-sodium, lean deli meat. Dairy Low-fat (1%) or fat-free (skim) milk. Fat-free, low-fat, or reduced-fat cheeses. Nonfat, low-sodium ricotta or cottage cheese. Low-fat or nonfat yogurt. Low-fat, low-sodium cheese. Fats and oils Soft margarine without trans fats. Vegetable oil. Low-fat, reduced-fat, or light mayonnaise and salad dressings  (reduced-sodium). Canola, safflower, olive, soybean, and sunflower oils. Avocado. Seasoning and other foods Herbs. Spices. Seasoning mixes without salt. Unsalted popcorn and pretzels. Fat-free sweets. What foods are not recommended? The items listed may not be a complete list. Talk with your dietitian about what dietary choices are best for you. Grains Baked goods made with fat, such as croissants, muffins, or some breads. Dry pasta or rice meal packs. Vegetables Creamed or fried vegetables. Vegetables in a cheese sauce. Regular canned vegetables (not low-sodium or reduced-sodium). Regular canned tomato sauce and paste (not low-sodium or reduced-sodium). Regular tomato and vegetable juice (not low-sodium or reduced-sodium). Angie Fava. Olives. Fruits Canned fruit in a light or heavy syrup. Fried fruit. Fruit in cream or butter sauce. Meat and other protein foods Fatty cuts of meat. Ribs. Fried meat. Berniece Salines. Sausage. Bologna and other processed lunch meats. Salami. Fatback. Hotdogs. Bratwurst. Salted nuts and seeds. Canned beans with added salt. Canned or smoked fish. Whole eggs or egg yolks. Chicken or [place] with skin. Dairy Whole or 2% milk, cream, and half-and-half. Whole or full-fat cream cheese. Whole-fat or sweetened yogurt. Full-fat cheese. Nondairy creamers. Whipped toppings. Processed cheese and cheese spreads. Fats and oils Butter. Stick margarine. Lard. Shortening. Ghee. Bacon fat. Tropical oils, such as coconut, palm kernel, or palm oil. Seasoning and other foods Salted popcorn and pretzels. Onion salt, garlic salt, seasoned salt, table salt, and sea salt. Worcestershire sauce. Tartar sauce. Barbecue sauce. Teriyaki sauce. Soy sauce, including reduced-sodium. Steak sauce. Canned and packaged gravies. Fish sauce. Oyster sauce. Cocktail sauce. Horseradish that you find on the shelf. Ketchup. Mustard. Meat flavorings and tenderizers. Bouillon cubes. Hot sauce and Tabasco sauce. Premade or  packaged marinades. Premade or packaged taco seasonings. Relishes. Regular salad dressings. Where to find more information:  National Heart, Lung, and [place]: https://wilson-eaton.com/  American Heart Association: www.heart.org Summary  The DASH eating plan is a healthy eating plan that has been shown to reduce high blood pressure (hypertension). It may also reduce your risk for type 2 diabetes, heart disease, and stroke.  With the DASH eating plan, you should limit salt (sodium) intake to 2,300 mg a day. If you have hypertension, you may need to reduce your sodium intake to 1,500 mg a day.  When on the DASH eating plan, aim to eat more fresh fruits and vegetables, whole grains, lean proteins, low-fat dairy, and heart-healthy fats.  Work with your health care provider or diet and nutrition specialist (dietitian) to adjust your eating plan to your individual calorie needs. This information is not intended to replace advice given to you by your health care provider. Make sure you discuss any questions you have with your health care provider. Document Released: 06/07/2011 Document Revised: 06/11/2016 Document Reviewed: 06/11/2016 Elsevier Interactive Patient Education  [employer].    If you have lab work done today you will be contacted with your lab results within the next 2 weeks.  If you have not heard from [place] then please contact us. The fastest way to get your results is to register for My Chart.   IF you received an x-ray today, you will receive an  invoice from [facility]. Please contact [facility] at [phone] with questions or concerns regarding your invoice.   IF you received labwork today, you will receive an invoice from [facility]. Please contact [facility] at [phone] with questions or concerns regarding your invoice.   Our billing staff will not be able to assist you with questions regarding bills from these companies.  You will be contacted  with the lab results as soon as they are available. The fastest way to get your results is to activate your My Chart account. Instructions are located on the last page of this paperwork. If you have not heard from [place] regarding the results in 2 weeks, please contact this office.     Hypertension Hypertension, commonly called high blood pressure, is when the force of blood pumping through the arteries is too strong. The arteries are the blood vessels that carry blood from the heart throughout the body. Hypertension forces the heart to work harder to pump blood and may cause arteries to become narrow or stiff. Having untreated or uncontrolled hypertension can cause heart attacks, strokes, kidney disease, and other problems. A blood pressure reading consists of a higher number over a lower number. Ideally, your blood pressure should be below 120/80. The first ("top") number is called the systolic pressure. It is a measure of the pressure in your arteries as your heart beats. The second ("bottom") number is called the diastolic pressure. It is a measure of the pressure in your arteries as the heart relaxes. What are the causes? The cause of this condition is not known. What increases the risk? Some risk factors for high blood pressure are under your control. Others are not. Factors you can change  Smoking.  Having type 2 diabetes mellitus, high cholesterol, or both.  Not getting enough exercise or physical activity.  Being overweight.  Having too much fat, sugar, calories, or salt (sodium) in your diet.  Drinking too much alcohol. Factors that are difficult or impossible to change  Having chronic kidney disease.  Having a family history of high blood pressure.  Age. Risk increases with age.  Race. You may be at higher risk if you are African-American.  Gender. Men are at higher risk than women before age 84. After age 71, women are at higher risk than men.  Having obstructive sleep  apnea.  Stress. What are the signs or symptoms? Extremely high blood pressure (hypertensive crisis) may cause:  Headache.  Anxiety.  Shortness of breath.  Nosebleed.  Nausea and vomiting.  Severe chest pain.  Jerky movements you cannot control (seizures). How is this diagnosed? This condition is diagnosed by measuring your blood pressure while you are seated, with your arm resting on a surface. The cuff of the blood pressure monitor will be placed directly against the skin of your upper arm at the level of your heart. It should be measured at least twice using the same arm. Certain conditions can cause a difference in blood pressure between your right and left arms. Certain factors can cause blood pressure readings to be lower or higher than normal (elevated) for a short period of time:  When your blood pressure is higher when you are in a health care provider's office than when you are at home, this is called white coat hypertension. Most people with this condition do not need medicines.  When your blood pressure is higher at home than when you are in a health care provider's office, this is called masked hypertension. Most  people with this condition may need medicines to control blood pressure. If you have a high blood pressure reading during one visit or you have normal blood pressure with other risk factors:  You may be asked to return on a different day to have your blood pressure checked again.  You may be asked to monitor your blood pressure at home for 1 week or longer. If you are diagnosed with hypertension, you may have other blood or imaging tests to help your health care provider understand your overall risk for other conditions. How is this treated? This condition is treated by making healthy lifestyle changes, such as eating healthy foods, exercising more, and reducing your alcohol intake. Your health care provider may prescribe medicine if lifestyle changes are not  enough to get your blood pressure under control, and if:  Your systolic blood pressure is above 130.  Your diastolic blood pressure is above 80. Your personal target blood pressure may vary depending on your medical conditions, your age, and other factors. Follow these instructions at home: Eating and drinking   Eat a diet that is high in fiber and potassium, and low in sodium, added sugar, and fat. An example eating plan is called the DASH (Dietary Approaches to Stop Hypertension) diet. To eat this way: ? Eat plenty of fresh fruits and vegetables. Try to fill half of your plate at each meal with fruits and vegetables. ? Eat whole grains, such as whole wheat pasta, brown rice, or whole grain bread. Fill about one quarter of your plate with whole grains. ? Eat or drink low-fat dairy products, such as skim milk or low-fat yogurt. ? Avoid fatty cuts of meat, processed or cured meats, and poultry with skin. Fill about one quarter of your plate with lean proteins, such as fish, chicken without skin, beans, eggs, and tofu. ? Avoid premade and processed foods. These tend to be higher in sodium, added sugar, and fat.  Reduce your daily sodium intake. Most people with hypertension should eat less than 1,500 mg of sodium a day.  Limit alcohol intake to no more than 1 drink a day for nonpregnant women and 2 drinks a day for men. One drink equals 12 oz of beer, 5 oz of wine, or 1 oz of hard liquor. Lifestyle   Work with your health care provider to maintain a healthy body weight or to lose weight. Ask what an ideal weight is for you.  Get at least 30 minutes of exercise that causes your heart to beat faster (aerobic exercise) most days of the week. Activities may include walking, swimming, or biking.  Include exercise to strengthen your muscles (resistance exercise), such as pilates or lifting weights, as part of your weekly exercise routine. Try to do these types of exercises for 30 minutes at least  3 days a week.  Do not use any products that contain nicotine or tobacco, such as cigarettes and e-cigarettes. If you need help quitting, ask your health care provider.  Monitor your blood pressure at home as told by your health care provider.  Keep all follow-up visits as told by your health care provider. This is important. Medicines  Take over-the-counter and prescription medicines only as told by your health care provider. Follow directions carefully. Blood pressure medicines must be taken as prescribed.  Do not skip doses of blood pressure medicine. Doing this puts you at risk for problems and can make the medicine less effective.  Ask your health care provider about side  effects or reactions to medicines that you should watch for. Contact a health care provider if:  You think you are having a reaction to a medicine you are taking.  You have headaches that keep coming back (recurring).  You feel dizzy.  You have swelling in your ankles.  You have trouble with your vision. Get help right away if:  You develop a severe headache or confusion.  You have unusual weakness or numbness.  You feel faint.  You have severe pain in your chest or abdomen.  You vomit repeatedly.  You have trouble breathing. Summary  Hypertension is when the force of blood pumping through your arteries is too strong. If this condition is not controlled, it may put you at risk for serious complications.  Your personal target blood pressure may vary depending on your medical conditions, your age, and other factors. For most people, a normal blood pressure is less than 120/80.  Hypertension is treated with lifestyle changes, medicines, or a combination of both. Lifestyle changes include weight loss, eating a healthy, low-sodium diet, exercising more, and limiting alcohol. This information is not intended to replace advice given to you by your health care provider. Make sure you discuss any questions  you have with your health care provider. Document Released: 06/18/2005 Document Revised: 05/16/2016 Document Reviewed: 05/16/2016 Elsevier Interactive Patient Education  2019 Elsevier Inc.      Agustina Caroli, MD [facility]

## 2018-11-18 NOTE — Patient Instructions (Addendum)
Stop hydrochlorothiazide and start taking lisinopril/hydrochlorothiazide as prescribed today. DASH Eating Plan DASH stands for "Dietary Approaches to Stop Hypertension." The DASH eating plan is a healthy eating plan that has been shown to reduce high blood pressure (hypertension). It may also reduce your risk for type 2 diabetes, heart disease, and stroke. The DASH eating plan may also help with weight loss. What are tips for following this plan?  General guidelines  Avoid eating more than 2,300 mg (milligrams) of salt (sodium) a day. If you have hypertension, you may need to reduce your sodium intake to 1,500 mg a day.  Limit alcohol intake to no more than 1 drink a day for nonpregnant women and 2 drinks a day for men. One drink equals 12 oz of beer, 5 oz of wine, or 1 oz of hard liquor.  Work with your health care provider to maintain a healthy body weight or to lose weight. Ask what an ideal weight is for you.  Get at least 30 minutes of exercise that causes your heart to beat faster (aerobic exercise) most days of the week. Activities may include walking, swimming, or biking.  Work with your health care provider or diet and nutrition specialist (dietitian) to adjust your eating plan to your individual calorie needs. Reading food labels   Check food labels for the amount of sodium per serving. Choose foods with less than 5 percent of the Daily Value of sodium. Generally, foods with less than 300 mg of sodium per serving fit into this eating plan.  To find whole grains, look for the word "whole" as the first word in the ingredient list. Shopping  Buy products labeled as "low-sodium" or "no salt added."  Buy fresh foods. Avoid canned foods and premade or frozen meals. Cooking  Avoid adding salt when cooking. Use salt-free seasonings or herbs instead of table salt or sea salt. Check with your health care provider or pharmacist before using salt substitutes.  Do not fry foods. Cook  foods using healthy methods such as baking, boiling, grilling, and broiling instead.  Cook with heart-healthy oils, such as olive, canola, soybean, or sunflower oil. Meal planning  Eat a balanced diet that includes: ? 5 or more servings of fruits and vegetables each day. At each meal, try to fill half of your plate with fruits and vegetables. ? Up to 6-8 servings of whole grains each day. ? Less than 6 oz of lean meat, poultry, or fish each day. A 3-oz serving of meat is about the same size as a deck of cards. One egg equals 1 oz. ? 2 servings of low-fat dairy each day. ? A serving of nuts, seeds, or beans 5 times each week. ? Heart-healthy fats. Healthy fats called Omega-3 fatty acids are found in foods such as flaxseeds and coldwater fish, like sardines, salmon, and mackerel.  Limit how much you eat of the following: ? Canned or prepackaged foods. ? Food that is high in trans fat, such as fried foods. ? Food that is high in saturated fat, such as fatty meat. ? Sweets, desserts, sugary drinks, and other foods with added sugar. ? Full-fat dairy products.  Do not salt foods before eating.  Try to eat at least 2 vegetarian meals each week.  Eat more home-cooked food and less restaurant, buffet, and fast food.  When eating at a restaurant, ask that your food be prepared with less salt or no salt, if possible. What foods are recommended? The items listed may not  be a complete list. Talk with your dietitian about what dietary choices are best for you. Grains Whole-grain or whole-wheat bread. Whole-grain or whole-wheat pasta. Brown rice. [place]. Bulgur. Whole-grain and low-sodium cereals. Pita bread. Low-fat, low-sodium crackers. Whole-wheat flour tortillas. Vegetables Fresh or frozen vegetables (raw, steamed, roasted, or grilled). Low-sodium or reduced-sodium tomato and vegetable juice. Low-sodium or reduced-sodium tomato sauce and tomato paste. Low-sodium or reduced-sodium canned  vegetables. Fruits All fresh, dried, or frozen fruit. Canned fruit in natural juice (without added sugar). Meat and other protein foods Skinless chicken or [place]. Ground chicken or [place]. Pork with fat trimmed off. Fish and seafood. Egg whites. Dried beans, peas, or lentils. Unsalted nuts, nut butters, and seeds. Unsalted canned beans. Lean cuts of beef with fat trimmed off. Low-sodium, lean deli meat. Dairy Low-fat (1%) or fat-free (skim) milk. Fat-free, low-fat, or reduced-fat cheeses. Nonfat, low-sodium ricotta or cottage cheese. Low-fat or nonfat yogurt. Low-fat, low-sodium cheese. Fats and oils Soft margarine without trans fats. Vegetable oil. Low-fat, reduced-fat, or light mayonnaise and salad dressings (reduced-sodium). Canola, safflower, olive, soybean, and sunflower oils. Avocado. Seasoning and other foods Herbs. Spices. Seasoning mixes without salt. Unsalted popcorn and pretzels. Fat-free sweets. What foods are not recommended? The items listed may not be a complete list. Talk with your dietitian about what dietary choices are best for you. Grains Baked goods made with fat, such as croissants, muffins, or some breads. Dry pasta or rice meal packs. Vegetables Creamed or fried vegetables. Vegetables in a cheese sauce. Regular canned vegetables (not low-sodium or reduced-sodium). Regular canned tomato sauce and paste (not low-sodium or reduced-sodium). Regular tomato and vegetable juice (not low-sodium or reduced-sodium). Angie Fava. Olives. Fruits Canned fruit in a light or heavy syrup. Fried fruit. Fruit in cream or butter sauce. Meat and other protein foods Fatty cuts of meat. Ribs. Fried meat. Berniece Salines. Sausage. Bologna and other processed lunch meats. Salami. Fatback. Hotdogs. Bratwurst. Salted nuts and seeds. Canned beans with added salt. Canned or smoked fish. Whole eggs or egg yolks. Chicken or [place] with skin. Dairy Whole or 2% milk, cream, and half-and-half. Whole or full-fat  cream cheese. Whole-fat or sweetened yogurt. Full-fat cheese. Nondairy creamers. Whipped toppings. Processed cheese and cheese spreads. Fats and oils Butter. Stick margarine. Lard. Shortening. Ghee. Bacon fat. Tropical oils, such as coconut, palm kernel, or palm oil. Seasoning and other foods Salted popcorn and pretzels. Onion salt, garlic salt, seasoned salt, table salt, and sea salt. Worcestershire sauce. Tartar sauce. Barbecue sauce. Teriyaki sauce. Soy sauce, including reduced-sodium. Steak sauce. Canned and packaged gravies. Fish sauce. Oyster sauce. Cocktail sauce. Horseradish that you find on the shelf. Ketchup. Mustard. Meat flavorings and tenderizers. Bouillon cubes. Hot sauce and Tabasco sauce. Premade or packaged marinades. Premade or packaged taco seasonings. Relishes. Regular salad dressings. Where to find more information:  National Heart, Lung, and [place]: https://wilson-eaton.com/  American Heart Association: www.heart.org Summary  The DASH eating plan is a healthy eating plan that has been shown to reduce high blood pressure (hypertension). It may also reduce your risk for type 2 diabetes, heart disease, and stroke.  With the DASH eating plan, you should limit salt (sodium) intake to 2,300 mg a day. If you have hypertension, you may need to reduce your sodium intake to 1,500 mg a day.  When on the DASH eating plan, aim to eat more fresh fruits and vegetables, whole grains, lean proteins, low-fat dairy, and heart-healthy fats.  Work with your health care provider or diet and nutrition  specialist (dietitian) to adjust your eating plan to your individual calorie needs. This information is not intended to replace advice given to you by your health care provider. Make sure you discuss any questions you have with your health care provider. Document Released: 06/07/2011 Document Revised: 06/11/2016 Document Reviewed: 06/11/2016 Elsevier Interactive Patient Education  [employer].    If you have lab work done today you will be contacted with your lab results within the next 2 weeks.  If you have not heard from [place] then please contact us. The fastest way to get your results is to register for My Chart.   IF you received an x-ray today, you will receive an invoice from [facility]. Please contact [facility] at [phone] with questions or concerns regarding your invoice.   IF you received labwork today, you will receive an invoice from [facility]. Please contact [facility] at [phone] with questions or concerns regarding your invoice.   Our billing staff will not be able to assist you with questions regarding bills from these companies.  You will be contacted with the lab results as soon as they are available. The fastest way to get your results is to activate your My Chart account. Instructions are located on the last page of this paperwork. If you have not heard from [place] regarding the results in 2 weeks, please contact this office.     Hypertension Hypertension, commonly called high blood pressure, is when the force of blood pumping through the arteries is too strong. The arteries are the blood vessels that carry blood from the heart throughout the body. Hypertension forces the heart to work harder to pump blood and may cause arteries to become narrow or stiff. Having untreated or uncontrolled hypertension can cause heart attacks, strokes, kidney disease, and other problems. A blood pressure reading consists of a higher number over a lower number. Ideally, your blood pressure should be below 120/80. The first ("top") number is called the systolic pressure. It is a measure of the pressure in your arteries as your heart beats. The second ("bottom") number is called the diastolic pressure. It is a measure of the pressure in your arteries as the heart relaxes. What are the causes? The cause of this condition is not known. What increases the  risk? Some risk factors for high blood pressure are under your control. Others are not. Factors you can change  Smoking.  Having type 2 diabetes mellitus, high cholesterol, or both.  Not getting enough exercise or physical activity.  Being overweight.  Having too much fat, sugar, calories, or salt (sodium) in your diet.  Drinking too much alcohol. Factors that are difficult or impossible to change  Having chronic kidney disease.  Having a family history of high blood pressure.  Age. Risk increases with age.  Race. You may be at higher risk if you are African-American.  Gender. Men are at higher risk than women before age 79. After age 61, women are at higher risk than men.  Having obstructive sleep apnea.  Stress. What are the signs or symptoms? Extremely high blood pressure (hypertensive crisis) may cause:  Headache.  Anxiety.  Shortness of breath.  Nosebleed.  Nausea and vomiting.  Severe chest pain.  Jerky movements you cannot control (seizures). How is this diagnosed? This condition is diagnosed by measuring your blood pressure while you are seated, with your arm resting on a surface. The cuff of the blood pressure monitor will be placed directly against the skin of  your upper arm at the level of your heart. It should be measured at least twice using the same arm. Certain conditions can cause a difference in blood pressure between your right and left arms. Certain factors can cause blood pressure readings to be lower or higher than normal (elevated) for a short period of time:  When your blood pressure is higher when you are in a health care provider's office than when you are at home, this is called white coat hypertension. Most people with this condition do not need medicines.  When your blood pressure is higher at home than when you are in a health care provider's office, this is called masked hypertension. Most people with this condition may need medicines  to control blood pressure. If you have a high blood pressure reading during one visit or you have normal blood pressure with other risk factors:  You may be asked to return on a different day to have your blood pressure checked again.  You may be asked to monitor your blood pressure at home for 1 week or longer. If you are diagnosed with hypertension, you may have other blood or imaging tests to help your health care provider understand your overall risk for other conditions. How is this treated? This condition is treated by making healthy lifestyle changes, such as eating healthy foods, exercising more, and reducing your alcohol intake. Your health care provider may prescribe medicine if lifestyle changes are not enough to get your blood pressure under control, and if:  Your systolic blood pressure is above 130.  Your diastolic blood pressure is above 80. Your personal target blood pressure may vary depending on your medical conditions, your age, and other factors. Follow these instructions at home: Eating and drinking   Eat a diet that is high in fiber and potassium, and low in sodium, added sugar, and fat. An example eating plan is called the DASH (Dietary Approaches to Stop Hypertension) diet. To eat this way: ? Eat plenty of fresh fruits and vegetables. Try to fill half of your plate at each meal with fruits and vegetables. ? Eat whole grains, such as whole wheat pasta, brown rice, or whole grain bread. Fill about one quarter of your plate with whole grains. ? Eat or drink low-fat dairy products, such as skim milk or low-fat yogurt. ? Avoid fatty cuts of meat, processed or cured meats, and poultry with skin. Fill about one quarter of your plate with lean proteins, such as fish, chicken without skin, beans, eggs, and tofu. ? Avoid premade and processed foods. These tend to be higher in sodium, added sugar, and fat.  Reduce your daily sodium intake. Most people with hypertension should  eat less than 1,500 mg of sodium a day.  Limit alcohol intake to no more than 1 drink a day for nonpregnant women and 2 drinks a day for men. One drink equals 12 oz of beer, 5 oz of wine, or 1 oz of hard liquor. Lifestyle   Work with your health care provider to maintain a healthy body weight or to lose weight. Ask what an ideal weight is for you.  Get at least 30 minutes of exercise that causes your heart to beat faster (aerobic exercise) most days of the week. Activities may include walking, swimming, or biking.  Include exercise to strengthen your muscles (resistance exercise), such as pilates or lifting weights, as part of your weekly exercise routine. Try to do these types of exercises for 30 minutes at  least 3 days a week.  Do not use any products that contain nicotine or tobacco, such as cigarettes and e-cigarettes. If you need help quitting, ask your health care provider.  Monitor your blood pressure at home as told by your health care provider.  Keep all follow-up visits as told by your health care provider. This is important. Medicines  Take over-the-counter and prescription medicines only as told by your health care provider. Follow directions carefully. Blood pressure medicines must be taken as prescribed.  Do not skip doses of blood pressure medicine. Doing this puts you at risk for problems and can make the medicine less effective.  Ask your health care provider about side effects or reactions to medicines that you should watch for. Contact a health care provider if:  You think you are having a reaction to a medicine you are taking.  You have headaches that keep coming back (recurring).  You feel dizzy.  You have swelling in your ankles.  You have trouble with your vision. Get help right away if:  You develop a severe headache or confusion.  You have unusual weakness or numbness.  You feel faint.  You have severe pain in your chest or abdomen.  You vomit  repeatedly.  You have trouble breathing. Summary  Hypertension is when the force of blood pumping through your arteries is too strong. If this condition is not controlled, it may put you at risk for serious complications.  Your personal target blood pressure may vary depending on your medical conditions, your age, and other factors. For most people, a normal blood pressure is less than 120/80.  Hypertension is treated with lifestyle changes, medicines, or a combination of both. Lifestyle changes include weight loss, eating a healthy, low-sodium diet, exercising more, and limiting alcohol. This information is not intended to replace advice given to you by your health care provider. Make sure you discuss any questions you have with your health care provider. Document Released: 06/18/2005 Document Revised: 05/16/2016 Document Reviewed: 05/16/2016 Elsevier Interactive Patient Education  2019 [employer].

## 2018-12-09 ENCOUNTER — Telehealth (INDEPENDENT_AMBULATORY_CARE_PROVIDER_SITE_OTHER): Payer: BLUE CROSS/BLUE SHIELD | Admitting: Emergency Medicine

## 2018-12-09 ENCOUNTER — Encounter: Payer: Self-pay | Admitting: Emergency Medicine

## 2018-12-09 ENCOUNTER — Other Ambulatory Visit: Payer: Self-pay

## 2018-12-09 VITALS — BP 136/75 | Ht 69.0 in | Wt 245.6 lb

## 2018-12-09 DIAGNOSIS — I10 Essential (primary) hypertension: Secondary | ICD-10-CM | POA: Diagnosis not present

## 2018-12-09 MED ORDER — LABETALOL HCL 100 MG PO TABS: 100.0000 mg | ORAL_TABLET | Freq: Two times a day (BID) | ORAL | 3 refills | Status: DC

## 2018-12-09 NOTE — Progress Notes (Signed)
Called patient to triage for appointment. Patient is following up on blood pressure. Patient states he keeps a reading, this morning it was 136/75 and last night 133/81. Patient states he does not have any other complaints, he feels okay. Patient thinks he will need a refill on Labetalol, not sure because this medication was prescribed at the emergency room on 11/14/2018.

## 2018-12-09 NOTE — Progress Notes (Signed)
Telemedicine Encounter- SOAP NOTE Established Patient  This telephone encounter was conducted with the patient's (or proxy's) verbal consent via audio telecommunications: yes/no: Yes Patient was instructed to have this encounter in a suitably private space; and to only have persons present to whom they give permission to participate. In addition, patient identity was confirmed by use of name plus two identifiers (DOB and address).  I discussed the limitations, risks, security and privacy concerns of performing an evaluation and management service by telephone and the availability of in person appointments. I also discussed with the patient that there may be a patient responsible charge related to this service. The patient expressed understanding and agreed to proceed.  I spent a total of TIME; 0 MIN TO 60 MIN: 10 minutes talking with the patient or their proxy.  No chief complaint on file.  Hypertension follow-up Subjective   Marcus Callahan is a 48 y.o. male established patient. Telephone visit today for hypertension follow-up and medication refill.  Blood pressure readings at home have been within normal limits.  Systolics 329J and diastolics 70 to 24Q.  Compliant with medication.  Presently taking labetalol 100 mg twice a day and Zestoretic 20-25 once a day.  Has no complaints or medical concerns today.  HPI   Patient Active Problem List   Diagnosis Date Noted  . Hematuria 11/18/2018  . Uncontrolled hypertension 11/18/2018    History reviewed. No pertinent past medical history.  Current Outpatient Medications  Medication Sig Dispense Refill  . labetalol (NORMODYNE) 100 MG tablet Take 1 tablet (100 mg total) by mouth 2 (two) times daily. 180 tablet 3  . lisinopril-hydrochlorothiazide (ZESTORETIC) 20-25 MG tablet Take 1 tablet by mouth daily. 90 tablet 3  . hydrochlorothiazide (HYDRODIURIL) 25 MG tablet Take 1 tablet (25 mg total) by mouth daily for 30 days. (Patient not  taking: Reported on 12/09/2018) 30 tablet 0   No current facility-administered medications for this visit.     Allergies  Allergen Reactions  . Codeine Other (See Comments)    Nose bleeds    Social History   Socioeconomic History  . Marital status: Single    Spouse name: Not on file  . Number of children: Not on file  . Years of education: Not on file  . Highest education level: Not on file  Occupational History  . Not on file  Social Needs  . Financial resource strain: Not on file  . Food insecurity:    Worry: Not on file    Inability: Not on file  . Transportation needs:    Medical: Not on file    Non-medical: Not on file  Tobacco Use  . Smoking status: Never Smoker  . Smokeless tobacco: Never Used  Substance and Sexual Activity  . Alcohol use: Yes    Comment: occassionaly  . Drug use: Never  . Sexual activity: Not on file  Lifestyle  . Physical activity:    Days per week: Not on file    Minutes per session: Not on file  . Stress: Not on file  Relationships  . Social connections:    Talks on phone: Not on file    Gets together: Not on file    Attends religious service: Not on file    Active member of club or organization: Not on file    Attends meetings of clubs or organizations: Not on file    Relationship status: Not on file  . Intimate partner violence:    Fear of  current or ex partner: Not on file    Emotionally abused: Not on file    Physically abused: Not on file    Forced sexual activity: Not on file  Other Topics Concern  . Not on file  Social History Narrative  . Not on file    Review of Systems  Constitutional: Negative.  Negative for chills and fever.  HENT: Negative for congestion and sore throat.   Respiratory: Negative.  Negative for cough and shortness of breath.   Cardiovascular: Negative.  Negative for chest pain and palpitations.  Gastrointestinal: Negative.  Negative for abdominal pain, diarrhea, nausea and vomiting.   Genitourinary: Negative.   Musculoskeletal: Negative.  Negative for back pain, myalgias and neck pain.  Skin: Negative.  Negative for rash.  Neurological: Negative for dizziness and headaches.  Endo/Heme/Allergies: Negative.   All other systems reviewed and are negative.   Objective   Vitals as reported by the patient: Today's Vitals   12/09/18 0935  BP: 136/75  Weight: 245 lb 9.6 oz (111.4 kg)  Height: 5\' 9"  (1.753 m)   Awake and oriented x3 in no apparent respiratory distress Diagnoses and all orders for this visit:  Essential hypertension -     labetalol (NORMODYNE) 100 MG tablet; Take 1 tablet (100 mg total) by mouth 2 (two) times daily.  Well-controlled hypertension.  Continue present medications.  No changes. Follow-up in the office in 3 to 6 months.   I discussed the assessment and treatment plan with the patient. The patient was provided an opportunity to ask questions and all were answered. The patient agreed with the plan and demonstrated an understanding of the instructions.   The patient was advised to call back or seek an in-person evaluation if the symptoms worsen or if the condition fails to improve as anticipated.  I provided 10 minutes of non-face-to-face time during this encounter.  [name], MD  Primary Care at [facility]

## 2019-03-16 ENCOUNTER — Other Ambulatory Visit: Payer: Self-pay

## 2019-03-16 ENCOUNTER — Encounter: Payer: Self-pay | Admitting: Emergency Medicine

## 2019-03-16 ENCOUNTER — Ambulatory Visit: Payer: BLUE CROSS/BLUE SHIELD | Admitting: Emergency Medicine

## 2019-03-16 VITALS — BP 128/81 | HR 72 | Temp 98.7°F | Resp 16 | Ht 70.0 in | Wt 243.6 lb

## 2019-03-16 DIAGNOSIS — I10 Essential (primary) hypertension: Secondary | ICD-10-CM | POA: Diagnosis not present

## 2019-03-16 NOTE — Progress Notes (Signed)
BP Readings from Last 3 Encounters:  03/16/19 128/81  12/09/18 136/75  11/18/18 (!) 140/94   Marcus Callahan 48 y.o.   Chief Complaint  Patient presents with  . Hypertension    follow up 6 month    HISTORY OF PRESENT ILLNESS: This is a 48 y.o. male with history of hypertension here for follow-up. Doing well.  Compliant with medications. No complaints or medical concerns today.  HPI   Prior to Admission medications   Medication Sig Start Date End Date Taking? Authorizing Provider  lisinopril-hydrochlorothiazide (ZESTORETIC) 20-25 MG tablet Take 1 tablet by mouth daily. 11/18/18  Yes Marcus Callahan, Marcus Bloomer, MD  hydrochlorothiazide (HYDRODIURIL) 25 MG tablet Take 1 tablet (25 mg total) by mouth daily for 30 days. Patient not taking: Reported on 12/09/2018 11/14/18 12/14/18  Marcus Callahan A, PA-C  labetalol (NORMODYNE) 100 MG tablet Take 1 tablet (100 mg total) by mouth 2 (two) times daily. 12/09/18 03/09/19  Marcus Pollen, MD    Allergies  Allergen Reactions  . Codeine Other (See Comments)    Nose bleeds    Patient Active Problem List   Diagnosis Date Noted  . Uncontrolled hypertension 11/18/2018    History reviewed. No pertinent past medical history.  History reviewed. No pertinent surgical history.  Social History   Socioeconomic History  . Marital status: Single    Spouse name: Not on file  . Number of children: Not on file  . Years of education: Not on file  . Highest education level: Not on file  Occupational History  . Not on file  Social Needs  . Financial resource strain: Not on file  . Food insecurity    Worry: Not on file    Inability: Not on file  . Transportation needs    Medical: Not on file    Non-medical: Not on file  Tobacco Use  . Smoking status: Never Smoker  . Smokeless tobacco: Never Used  Substance and Sexual Activity  . Alcohol use: Yes    Comment: occassionaly  . Drug use: Never  . Sexual activity: Not on file  Lifestyle   . Physical activity    Days per week: Not on file    Minutes per session: Not on file  . Stress: Not on file  Relationships  . Social Herbalist on phone: Not on file    Gets together: Not on file    Attends religious service: Not on file    Active member of club or organization: Not on file    Attends meetings of clubs or organizations: Not on file    Relationship status: Not on file  . Intimate partner violence    Fear of current or ex partner: Not on file    Emotionally abused: Not on file    Physically abused: Not on file    Forced sexual activity: Not on file  Other Topics Concern  . Not on file  Social History Narrative  . Not on file    History reviewed. No pertinent family history.   Review of Systems  Constitutional: Negative.  Negative for chills and fever.  HENT: Negative.  Negative for congestion and sore throat.   Respiratory: Negative.  Negative for cough and shortness of breath.   Cardiovascular: Negative.  Negative for chest pain and palpitations.  Gastrointestinal: Negative.  Negative for abdominal pain, blood in stool, diarrhea, nausea and vomiting.  Genitourinary: Negative.  Negative for dysuria.  Musculoskeletal: Negative.   Skin: Negative.  Negative for rash.  Neurological: Negative for dizziness and headaches.  All other systems reviewed and are negative.   Vitals:   03/16/19 1642  BP: 128/81  Pulse: 72  Resp: 16  Temp: 98.7 F (37.1 C)  SpO2: 99%    Physical Exam Vitals signs reviewed.  HENT:     Head: Normocephalic.  Eyes:     Extraocular Movements: Extraocular movements intact.     Pupils: Pupils are equal, round, and reactive to light.  Neck:     Musculoskeletal: Normal range of motion.  Cardiovascular:     Rate and Rhythm: Normal rate and regular rhythm.     Heart sounds: Normal heart sounds.  Pulmonary:     Effort: Pulmonary effort is normal.     Breath sounds: Normal breath sounds.  Musculoskeletal: Normal range  of motion.  Skin:    General: Skin is warm and dry.     Capillary Refill: Capillary refill takes less than 2 seconds.  Neurological:     General: No focal deficit present.     Mental Status: He is alert and oriented to person, place, and time.  Psychiatric:        Mood and Affect: Mood normal.        Behavior: Behavior normal.    A total of 25 minutes was spent in the room with the patient, greater than 50% of which was in counseling/coordination of care regarding hypertension, management, diet and nutrition, weight loss, importance of physical activity, need for repeat blood work due to prior episode of hematuria and renal insufficiency, prognosis and need for follow-up.   ASSESSMENT & PLAN: Well-controlled blood pressure.  Continue present medications.  No changes. Marcus Callahan was seen today for hypertension.  Diagnoses and all orders for this visit:  Essential hypertension -     CBC with Differential/Platelet -     Comprehensive metabolic panel    Patient Instructions       If you have lab work done today you will be contacted with your lab results within the next 2 weeks.  If you have not heard from [place] then please contact us. The fastest way to get your results is to register for My Chart.   IF you received an x-ray today, you will receive an invoice from [facility]. Please contact [facility] at [phone] with questions or concerns regarding your invoice.   IF you received labwork today, you will receive an invoice from [facility]. Please contact [facility] at [phone] with questions or concerns regarding your invoice.   Our billing staff will not be able to assist you with questions regarding bills from these companies.  You will be contacted with the lab results as soon as they are available. The fastest way to get your results is to activate your My Chart account. Instructions are located on the last page of this paperwork. If you have not heard from  [place] regarding the results in 2 weeks, please contact this office.     Hypertension, Adult High blood pressure (hypertension) is when the force of blood pumping through the arteries is too strong. The arteries are the blood vessels that carry blood from the heart throughout the body. Hypertension forces the heart to work harder to pump blood and may cause arteries to become narrow or stiff. Untreated or uncontrolled hypertension can cause a heart attack, heart failure, a stroke, kidney disease, and other problems. A blood pressure reading consists of a higher number over a lower number. Ideally,  your blood pressure should be below 120/80. The first ("top") number is called the systolic pressure. It is a measure of the pressure in your arteries as your heart beats. The second ("bottom") number is called the diastolic pressure. It is a measure of the pressure in your arteries as the heart relaxes. What are the causes? The exact cause of this condition is not known. There are some conditions that result in or are related to high blood pressure. What increases the risk? Some risk factors for high blood pressure are under your control. The following factors may make you more likely to develop this condition:  Smoking.  Having type 2 diabetes mellitus, high cholesterol, or both.  Not getting enough exercise or physical activity.  Being overweight.  Having too much fat, sugar, calories, or salt (sodium) in your diet.  Drinking too much alcohol. Some risk factors for high blood pressure may be difficult or impossible to change. Some of these factors include:  Having chronic kidney disease.  Having a family history of high blood pressure.  Age. Risk increases with age.  Race. You may be at higher risk if you are African American.  Gender. Men are at higher risk than women before age 71. After age 44, women are at higher risk than men.  Having obstructive sleep apnea.  Stress. What are the  signs or symptoms? High blood pressure may not cause symptoms. Very high blood pressure (hypertensive crisis) may cause:  Headache.  Anxiety.  Shortness of breath.  Nosebleed.  Nausea and vomiting.  Vision changes.  Severe chest pain.  Seizures. How is this diagnosed? This condition is diagnosed by measuring your blood pressure while you are seated, with your arm resting on a flat surface, your legs uncrossed, and your feet flat on the floor. The cuff of the blood pressure monitor will be placed directly against the skin of your upper arm at the level of your heart. It should be measured at least twice using the same arm. Certain conditions can cause a difference in blood pressure between your right and left arms. Certain factors can cause blood pressure readings to be lower or higher than normal for a short period of time:  When your blood pressure is higher when you are in a health care provider's office than when you are at home, this is called white coat hypertension. Most people with this condition do not need medicines.  When your blood pressure is higher at home than when you are in a health care provider's office, this is called masked hypertension. Most people with this condition may need medicines to control blood pressure. If you have a high blood pressure reading during one visit or you have normal blood pressure with other risk factors, you may be asked to:  Return on a different day to have your blood pressure checked again.  Monitor your blood pressure at home for 1 week or longer. If you are diagnosed with hypertension, you may have other blood or imaging tests to help your health care provider understand your overall risk for other conditions. How is this treated? This condition is treated by making healthy lifestyle changes, such as eating healthy foods, exercising more, and reducing your alcohol intake. Your health care provider may prescribe medicine if lifestyle  changes are not enough to get your blood pressure under control, and if:  Your systolic blood pressure is above 130.  Your diastolic blood pressure is above 80. Your personal target blood pressure may  vary depending on your medical conditions, your age, and other factors. Follow these instructions at home: Eating and drinking   Eat a diet that is high in fiber and potassium, and low in sodium, added sugar, and fat. An example eating plan is called the DASH (Dietary Approaches to Stop Hypertension) diet. To eat this way: ? Eat plenty of fresh fruits and vegetables. Try to fill one half of your plate at each meal with fruits and vegetables. ? Eat whole grains, such as whole-wheat pasta, brown rice, or whole-grain bread. Fill about one fourth of your plate with whole grains. ? Eat or drink low-fat dairy products, such as skim milk or low-fat yogurt. ? Avoid fatty cuts of meat, processed or cured meats, and poultry with skin. Fill about one fourth of your plate with lean proteins, such as fish, chicken without skin, beans, eggs, or tofu. ? Avoid pre-made and processed foods. These tend to be higher in sodium, added sugar, and fat.  Reduce your daily sodium intake. Most people with hypertension should eat less than 1,500 mg of sodium a day.  Do not drink alcohol if: ? Your health care provider tells you not to drink. ? You are pregnant, may be pregnant, or are planning to become pregnant.  If you drink alcohol: ? Limit how much you use to:  0-1 drink a day for women.  0-2 drinks a day for men. ? Be aware of how much alcohol is in your drink. In the [place] one drink equals one 12 oz bottle of beer (355 mL), one 5 oz glass of wine (148 mL), or one 1 oz glass of hard liquor (44 mL). Lifestyle   Work with your health care provider to maintain a healthy body weight or to lose weight. Ask what an ideal weight is for you.  Get at least 30 minutes of exercise most days of the week. Activities  may include walking, swimming, or biking.  Include exercise to strengthen your muscles (resistance exercise), such as Pilates or lifting weights, as part of your weekly exercise routine. Try to do these types of exercises for 30 minutes at least 3 days a week.  Do not use any products that contain nicotine or tobacco, such as cigarettes, e-cigarettes, and chewing tobacco. If you need help quitting, ask your health care provider.  Monitor your blood pressure at home as told by your health care provider.  Keep all follow-up visits as told by your health care provider. This is important. Medicines  Take over-the-counter and prescription medicines only as told by your health care provider. Follow directions carefully. Blood pressure medicines must be taken as prescribed.  Do not skip doses of blood pressure medicine. Doing this puts you at risk for problems and can make the medicine less effective.  Ask your health care provider about side effects or reactions to medicines that you should watch for. Contact a health care provider if you:  Think you are having a reaction to a medicine you are taking.  Have headaches that keep coming back (recurring).  Feel dizzy.  Have swelling in your ankles.  Have trouble with your vision. Get help right away if you:  Develop a severe headache or confusion.  Have unusual weakness or numbness.  Feel faint.  Have severe pain in your chest or abdomen.  Vomit repeatedly.  Have trouble breathing. Summary  Hypertension is when the force of blood pumping through your arteries is too strong. If this condition is not controlled,  it may put you at risk for serious complications.  Your personal target blood pressure may vary depending on your medical conditions, your age, and other factors. For most people, a normal blood pressure is less than 120/80.  Hypertension is treated with lifestyle changes, medicines, or a combination of both. Lifestyle  changes include losing weight, eating a healthy, low-sodium diet, exercising more, and limiting alcohol. This information is not intended to replace advice given to you by your health care provider. Make sure you discuss any questions you have with your health care provider. Document Released: 06/18/2005 Document Revised: 02/26/2018 Document Reviewed: 02/26/2018 Elsevier Patient Education  2020 [employer]     Agustina Caroli, MD [facility]

## 2019-03-16 NOTE — Patient Instructions (Addendum)
   If you have lab work done today you will be contacted with your lab results within the next 2 weeks.  If you have not heard from us then please contact us. The fastest way to get your results is to register for My Chart.   IF you received an x-ray today, you will receive an invoice from [facility]. Please contact [facility] at [phone] with questions or concerns regarding your invoice.   IF you received labwork today, you will receive an invoice from [facility]. Please contact [facility] at [phone] with questions or concerns regarding your invoice.   Our billing staff will not be able to assist you with questions regarding bills from these companies.  You will be contacted with the lab results as soon as they are available. The fastest way to get your results is to activate your My Chart account. Instructions are located on the last page of this paperwork. If you have not heard from us regarding the results in 2 weeks, please contact this office.     Hypertension, Adult High blood pressure (hypertension) is when the force of blood pumping through the arteries is too strong. The arteries are the blood vessels that carry blood from the heart throughout the body. Hypertension forces the heart to work harder to pump blood and may cause arteries to become narrow or stiff. Untreated or uncontrolled hypertension can cause a heart attack, heart failure, a stroke, kidney disease, and other problems. A blood pressure reading consists of a higher number over a lower number. Ideally, your blood pressure should be below 120/80. The first ("top") number is called the systolic pressure. It is a measure of the pressure in your arteries as your heart beats. The second ("bottom") number is called the diastolic pressure. It is a measure of the pressure in your arteries as the heart relaxes. What are the causes? The exact cause of this condition is not known. There are some conditions  that result in or are related to high blood pressure. What increases the risk? Some risk factors for high blood pressure are under your control. The following factors may make you more likely to develop this condition:  Smoking.  Having type 2 diabetes mellitus, high cholesterol, or both.  Not getting enough exercise or physical activity.  Being overweight.  Having too much fat, sugar, calories, or salt (sodium) in your diet.  Drinking too much alcohol. Some risk factors for high blood pressure may be difficult or impossible to change. Some of these factors include:  Having chronic kidney disease.  Having a family history of high blood pressure.  Age. Risk increases with age.  Race. You may be at higher risk if you are African American.  Gender. Men are at higher risk than women before age 45. After age 65, women are at higher risk than men.  Having obstructive sleep apnea.  Stress. What are the signs or symptoms? High blood pressure may not cause symptoms. Very high blood pressure (hypertensive crisis) may cause:  Headache.  Anxiety.  Shortness of breath.  Nosebleed.  Nausea and vomiting.  Vision changes.  Severe chest pain.  Seizures. How is this diagnosed? This condition is diagnosed by measuring your blood pressure while you are seated, with your arm resting on a flat surface, your legs uncrossed, and your feet flat on the floor. The cuff of the blood pressure monitor will be placed directly against the skin of your upper arm at the level of your heart.   It should be measured at least twice using the same arm. Certain conditions can cause a difference in blood pressure between your right and left arms. Certain factors can cause blood pressure readings to be lower or higher than normal for a short period of time:  When your blood pressure is higher when you are in a health care provider's office than when you are at home, this is called white coat hypertension.  Most people with this condition do not need medicines.  When your blood pressure is higher at home than when you are in a health care provider's office, this is called masked hypertension. Most people with this condition may need medicines to control blood pressure. If you have a high blood pressure reading during one visit or you have normal blood pressure with other risk factors, you may be asked to:  Return on a different day to have your blood pressure checked again.  Monitor your blood pressure at home for 1 week or longer. If you are diagnosed with hypertension, you may have other blood or imaging tests to help your health care provider understand your overall risk for other conditions. How is this treated? This condition is treated by making healthy lifestyle changes, such as eating healthy foods, exercising more, and reducing your alcohol intake. Your health care provider may prescribe medicine if lifestyle changes are not enough to get your blood pressure under control, and if:  Your systolic blood pressure is above 130.  Your diastolic blood pressure is above 80. Your personal target blood pressure may vary depending on your medical conditions, your age, and other factors. Follow these instructions at home: Eating and drinking   Eat a diet that is high in fiber and potassium, and low in sodium, added sugar, and fat. An example eating plan is called the DASH (Dietary Approaches to Stop Hypertension) diet. To eat this way: ? Eat plenty of fresh fruits and vegetables. Try to fill one half of your plate at each meal with fruits and vegetables. ? Eat whole grains, such as whole-wheat pasta, brown rice, or whole-grain bread. Fill about one fourth of your plate with whole grains. ? Eat or drink low-fat dairy products, such as skim milk or low-fat yogurt. ? Avoid fatty cuts of meat, processed or cured meats, and poultry with skin. Fill about one fourth of your plate with lean proteins, such  as fish, chicken without skin, beans, eggs, or tofu. ? Avoid pre-made and processed foods. These tend to be higher in sodium, added sugar, and fat.  Reduce your daily sodium intake. Most people with hypertension should eat less than 1,500 mg of sodium a day.  Do not drink alcohol if: ? Your health care provider tells you not to drink. ? You are pregnant, may be pregnant, or are planning to become pregnant.  If you drink alcohol: ? Limit how much you use to:  0-1 drink a day for women.  0-2 drinks a day for men. ? Be aware of how much alcohol is in your drink. In the [place] one drink equals one 12 oz bottle of beer (355 mL), one 5 oz glass of wine (148 mL), or one 1 oz glass of hard liquor (44 mL). Lifestyle   Work with your health care provider to maintain a healthy body weight or to lose weight. Ask what an ideal weight is for you.  Get at least 30 minutes of exercise most days of the week. Activities may include walking, swimming, or   biking.  Include exercise to strengthen your muscles (resistance exercise), such as Pilates or lifting weights, as part of your weekly exercise routine. Try to do these types of exercises for 30 minutes at least 3 days a week.  Do not use any products that contain nicotine or tobacco, such as cigarettes, e-cigarettes, and chewing tobacco. If you need help quitting, ask your health care provider.  Monitor your blood pressure at home as told by your health care provider.  Keep all follow-up visits as told by your health care provider. This is important. Medicines  Take over-the-counter and prescription medicines only as told by your health care provider. Follow directions carefully. Blood pressure medicines must be taken as prescribed.  Do not skip doses of blood pressure medicine. Doing this puts you at risk for problems and can make the medicine less effective.  Ask your health care provider about side effects or reactions to medicines that you  should watch for. Contact a health care provider if you:  Think you are having a reaction to a medicine you are taking.  Have headaches that keep coming back (recurring).  Feel dizzy.  Have swelling in your ankles.  Have trouble with your vision. Get help right away if you:  Develop a severe headache or confusion.  Have unusual weakness or numbness.  Feel faint.  Have severe pain in your chest or abdomen.  Vomit repeatedly.  Have trouble breathing. Summary  Hypertension is when the force of blood pumping through your arteries is too strong. If this condition is not controlled, it may put you at risk for serious complications.  Your personal target blood pressure may vary depending on your medical conditions, your age, and other factors. For most people, a normal blood pressure is less than 120/80.  Hypertension is treated with lifestyle changes, medicines, or a combination of both. Lifestyle changes include losing weight, eating a healthy, low-sodium diet, exercising more, and limiting alcohol. This information is not intended to replace advice given to you by your health care provider. Make sure you discuss any questions you have with your health care provider. Document Released: 06/18/2005 Document Revised: 02/26/2018 Document Reviewed: 02/26/2018 Elsevier Patient Education  2020 [place] DASH stands for "Dietary Approaches to Stop Hypertension." The DASH eating plan is a healthy eating plan that has been shown to reduce high blood pressure (hypertension). It may also reduce your risk for type 2 diabetes, heart disease, and stroke. The DASH eating plan may also help with weight loss. What are tips for following this plan?  General guidelines  Avoid eating more than 2,300 mg (milligrams) of salt (sodium) a day. If you have hypertension, you may need to reduce your sodium intake to 1,500 mg a day.  Limit alcohol intake to no more than 1 drink a day  for nonpregnant women and 2 drinks a day for men. One drink equals 12 oz of beer, 5 oz of wine, or 1 oz of hard liquor.  Work with your health care provider to maintain a healthy body weight or to lose weight. Ask what an ideal weight is for you.  Get at least 30 minutes of exercise that causes your heart to beat faster (aerobic exercise) most days of the week. Activities may include walking, swimming, or biking.  Work with your health care provider or diet and nutrition specialist (dietitian) to adjust your eating plan to your individual calorie needs. Reading food labels   Check food labels for  the amount of sodium per serving. Choose foods with less than 5 percent of the Daily Value of sodium. Generally, foods with less than 300 mg of sodium per serving fit into this eating plan.  To find whole grains, look for the word "whole" as the first word in the ingredient list. Shopping  Buy products labeled as "low-sodium" or "no salt added."  Buy fresh foods. Avoid canned foods and premade or frozen meals. Cooking  Avoid adding salt when cooking. Use salt-free seasonings or herbs instead of table salt or sea salt. Check with your health care provider or pharmacist before using salt substitutes.  Do not fry foods. Cook foods using healthy methods such as baking, boiling, grilling, and broiling instead.  Cook with heart-healthy oils, such as olive, canola, soybean, or sunflower oil. Meal planning  Eat a balanced diet that includes: ? 5 or more servings of fruits and vegetables each day. At each meal, try to fill half of your plate with fruits and vegetables. ? Up to 6-8 servings of whole grains each day. ? Less than 6 oz of lean meat, poultry, or fish each day. A 3-oz serving of meat is about the same size as a deck of cards. One egg equals 1 oz. ? 2 servings of low-fat dairy each day. ? A serving of nuts, seeds, or beans 5 times each week. ? Heart-healthy fats. Healthy fats called  Omega-3 fatty acids are found in foods such as flaxseeds and coldwater fish, like sardines, salmon, and mackerel.  Limit how much you eat of the following: ? Canned or prepackaged foods. ? Food that is high in trans fat, such as fried foods. ? Food that is high in saturated fat, such as fatty meat. ? Sweets, desserts, sugary drinks, and other foods with added sugar. ? Full-fat dairy products.  Do not salt foods before eating.  Try to eat at least 2 vegetarian meals each week.  Eat more home-cooked food and less restaurant, buffet, and fast food.  When eating at a restaurant, ask that your food be prepared with less salt or no salt, if possible. What foods are recommended? The items listed may not be a complete list. Talk with your dietitian about what dietary choices are best for you. Grains Whole-grain or whole-wheat bread. Whole-grain or whole-wheat pasta. Brown rice. [place]. Bulgur. Whole-grain and low-sodium cereals. Pita bread. Low-fat, low-sodium crackers. Whole-wheat flour tortillas. Vegetables Fresh or frozen vegetables (raw, steamed, roasted, or grilled). Low-sodium or reduced-sodium tomato and vegetable juice. Low-sodium or reduced-sodium tomato sauce and tomato paste. Low-sodium or reduced-sodium canned vegetables. Fruits All fresh, dried, or frozen fruit. Canned fruit in natural juice (without added sugar). Meat and other protein foods Skinless chicken or [place]. Ground chicken or [place]. Pork with fat trimmed off. Fish and seafood. Egg whites. Dried beans, peas, or lentils. Unsalted nuts, nut butters, and seeds. Unsalted canned beans. Lean cuts of beef with fat trimmed off. Low-sodium, lean deli meat. Dairy Low-fat (1%) or fat-free (skim) milk. Fat-free, low-fat, or reduced-fat cheeses. Nonfat, low-sodium ricotta or cottage cheese. Low-fat or nonfat yogurt. Low-fat, low-sodium cheese. Fats and oils Soft margarine without trans fats. Vegetable oil. Low-fat,  reduced-fat, or light mayonnaise and salad dressings (reduced-sodium). Canola, safflower, olive, soybean, and sunflower oils. Avocado. Seasoning and other foods Herbs. Spices. Seasoning mixes without salt. Unsalted popcorn and pretzels. Fat-free sweets. What foods are not recommended? The items listed may not be a complete list. Talk with your dietitian about what dietary choices are  best for you. Grains Baked goods made with fat, such as croissants, muffins, or some breads. Dry pasta or rice meal packs. Vegetables Creamed or fried vegetables. Vegetables in a cheese sauce. Regular canned vegetables (not low-sodium or reduced-sodium). Regular canned tomato sauce and paste (not low-sodium or reduced-sodium). Regular tomato and vegetable juice (not low-sodium or reduced-sodium). Angie Fava. Olives. Fruits Canned fruit in a light or heavy syrup. Fried fruit. Fruit in cream or butter sauce. Meat and other protein foods Fatty cuts of meat. Ribs. Fried meat. Berniece Salines. Sausage. Bologna and other processed lunch meats. Salami. Fatback. Hotdogs. Bratwurst. Salted nuts and seeds. Canned beans with added salt. Canned or smoked fish. Whole eggs or egg yolks. Chicken or [place] with skin. Dairy Whole or 2% milk, cream, and half-and-half. Whole or full-fat cream cheese. Whole-fat or sweetened yogurt. Full-fat cheese. Nondairy creamers. Whipped toppings. Processed cheese and cheese spreads. Fats and oils Butter. Stick margarine. Lard. Shortening. Ghee. Bacon fat. Tropical oils, such as coconut, palm kernel, or palm oil. Seasoning and other foods Salted popcorn and pretzels. Onion salt, garlic salt, seasoned salt, table salt, and sea salt. Worcestershire sauce. Tartar sauce. Barbecue sauce. Teriyaki sauce. Soy sauce, including reduced-sodium. Steak sauce. Canned and packaged gravies. Fish sauce. Oyster sauce. Cocktail sauce. Horseradish that you find on the shelf. Ketchup. Mustard. Meat flavorings and tenderizers.  Bouillon cubes. Hot sauce and Tabasco sauce. Premade or packaged marinades. Premade or packaged taco seasonings. Relishes. Regular salad dressings. Where to find more information:  National Heart, Lung, and [place]: https://wilson-eaton.com/  American Heart Association: www.heart.org Summary  The DASH eating plan is a healthy eating plan that has been shown to reduce high blood pressure (hypertension). It may also reduce your risk for type 2 diabetes, heart disease, and stroke.  With the DASH eating plan, you should limit salt (sodium) intake to 2,300 mg a day. If you have hypertension, you may need to reduce your sodium intake to 1,500 mg a day.  When on the DASH eating plan, aim to eat more fresh fruits and vegetables, whole grains, lean proteins, low-fat dairy, and heart-healthy fats.  Work with your health care provider or diet and nutrition specialist (dietitian) to adjust your eating plan to your individual calorie needs. This information is not intended to replace advice given to you by your health care provider. Make sure you discuss any questions you have with your health care provider. Document Released: 06/07/2011 Document Revised: 05/31/2017 Document Reviewed: 06/11/2016 Elsevier Patient Education  2020 [employer].

## 2019-03-17 ENCOUNTER — Encounter: Payer: Self-pay | Admitting: Emergency Medicine

## 2019-03-17 ENCOUNTER — Other Ambulatory Visit: Payer: Self-pay | Admitting: Emergency Medicine

## 2019-03-17 DIAGNOSIS — Z1211 Encounter for screening for malignant neoplasm of colon: Secondary | ICD-10-CM

## 2019-03-17 DIAGNOSIS — D649 Anemia, unspecified: Secondary | ICD-10-CM

## 2019-03-17 LAB — CBC WITH DIFFERENTIAL/PLATELET
Basophils Absolute: 0.1 10*3/uL (ref 0.0–0.2)
Basos: 1 %
EOS (ABSOLUTE): 0.2 10*3/uL (ref 0.0–0.4)
Eos: 2 %
Hematocrit: 35.4 % — ABNORMAL LOW (ref 37.5–51.0)
Hemoglobin: 11.4 g/dL — ABNORMAL LOW (ref 13.0–17.7)
Immature Grans (Abs): 0 10*3/uL (ref 0.0–0.1)
Immature Granulocytes: 0 %
Lymphocytes Absolute: 2.2 10*3/uL (ref 0.7–3.1)
Lymphs: 26 %
MCH: 25.9 pg — ABNORMAL LOW (ref 26.6–33.0)
MCHC: 32.2 g/dL (ref 31.5–35.7)
MCV: 80 fL (ref 79–97)
Monocytes Absolute: 0.8 10*3/uL (ref 0.1–0.9)
Monocytes: 9 %
Neutrophils Absolute: 5.2 10*3/uL (ref 1.4–7.0)
Neutrophils: 62 %
Platelets: 381 10*3/uL (ref 150–450)
RBC: 4.41 x10E6/uL (ref 4.14–5.80)
RDW: 13.8 % (ref 11.6–15.4)
WBC: 8.4 10*3/uL (ref 3.4–10.8)

## 2019-03-17 LAB — COMPREHENSIVE METABOLIC PANEL
ALT: 10 IU/L (ref 0–44)
AST: 12 IU/L (ref 0–40)
Albumin/Globulin Ratio: 2.2 (ref 1.2–2.2)
Albumin: 4.8 g/dL (ref 4.0–5.0)
Alkaline Phosphatase: 48 IU/L (ref 39–117)
BUN/Creatinine Ratio: 17 (ref 9–20)
BUN: 24 mg/dL (ref 6–24)
Bilirubin Total: 0.5 mg/dL (ref 0.0–1.2)
CO2: 18 mmol/L — ABNORMAL LOW (ref 20–29)
Calcium: 9.7 mg/dL (ref 8.7–10.2)
Chloride: 104 mmol/L (ref 96–106)
Creatinine, Ser: 1.42 mg/dL — ABNORMAL HIGH (ref 0.76–1.27)
GFR calc Af Amer: 66 mL/min/{1.73_m2} (ref >59)
GFR calc non Af Amer: 57 mL/min/{1.73_m2} — ABNORMAL LOW (ref >59)
Globulin, Total: 2.2 g/dL (ref 1.5–4.5)
Glucose: 81 mg/dL (ref 65–99)
Potassium: 4.2 mmol/L (ref 3.5–5.2)
Sodium: 138 mmol/L (ref 134–144)
Total Protein: 7 g/dL (ref 6.0–8.5)

## 2019-03-20 ENCOUNTER — Encounter: Payer: Self-pay | Admitting: Gastroenterology

## 2019-04-09 ENCOUNTER — Ambulatory Visit (AMBULATORY_SURGERY_CENTER): Payer: Self-pay | Admitting: *Deleted

## 2019-04-09 ENCOUNTER — Encounter: Payer: Self-pay | Admitting: Gastroenterology

## 2019-04-09 ENCOUNTER — Other Ambulatory Visit: Payer: Self-pay

## 2019-04-09 VITALS — Temp 96.9°F | Ht 70.0 in | Wt 243.0 lb

## 2019-04-09 DIAGNOSIS — Z1211 Encounter for screening for malignant neoplasm of colon: Secondary | ICD-10-CM

## 2019-04-09 MED ORDER — PEG 3350-KCL-NA BICARB-NACL 420 G PO SOLR: 4000.0000 mL | Freq: Once | ORAL | 0 refills | Status: AC

## 2019-04-09 NOTE — Progress Notes (Signed)
No egg or soy allergy known to patient  No past sedation with any surgeries  or procedures as an adult , no past  intubation  No diet pills per patient No home 02 use per patient  No blood thinners per patient  Pt denies issues with constipation  No A fib or A flutter  EMMI video sent to pt's e mail   Due to the COVID-19 pandemic we are asking patients to follow these guidelines. Please only bring one care partner. Please be aware that your care partner may wait in the car in the parking lot or if they feel like they will be too hot to wait in the car, they may wait in the lobby on the 4th floor. All care partners are required to wear a mask the entire time (we do not have any that we can provide them), they need to practice social distancing, and we will do a Covid check for all patient's and care partners when you arrive. Also we will check their temperature and your temperature. If the care partner waits in their car they need to stay in the parking lot the entire time and we will call them on their cell phone when the patient is ready for discharge so they can bring the car to the front of the building. Also all patient's will need to wear a mask into building.

## 2019-04-14 ENCOUNTER — Encounter: Payer: Self-pay | Admitting: Emergency Medicine

## 2019-04-23 ENCOUNTER — Telehealth: Payer: Self-pay

## 2019-04-23 NOTE — Telephone Encounter (Signed)
Covid-19 screening questions   Do you now or have you had a fever in the last 14 days? NO   Do you have any respiratory symptoms of shortness of breath or cough now or in the last 14 days? NO  Do you have any family members or close contacts with diagnosed or suspected Covid-19 in the past 14 days? NO  Have you been tested for Covid-19 and found to be positive? NO        

## 2019-04-24 ENCOUNTER — Ambulatory Visit (AMBULATORY_SURGERY_CENTER): Payer: BLUE CROSS/BLUE SHIELD | Admitting: Gastroenterology

## 2019-04-24 ENCOUNTER — Encounter: Payer: Self-pay | Admitting: Gastroenterology

## 2019-04-24 ENCOUNTER — Other Ambulatory Visit: Payer: Self-pay

## 2019-04-24 VITALS — BP 124/83 | HR 64 | Temp 99.2°F | Resp 22 | Ht 70.0 in | Wt 243.0 lb

## 2019-04-24 DIAGNOSIS — Z1211 Encounter for screening for malignant neoplasm of colon: Secondary | ICD-10-CM

## 2019-04-24 DIAGNOSIS — D124 Benign neoplasm of descending colon: Secondary | ICD-10-CM | POA: Diagnosis not present

## 2019-04-24 DIAGNOSIS — D128 Benign neoplasm of rectum: Secondary | ICD-10-CM

## 2019-04-24 DIAGNOSIS — D122 Benign neoplasm of ascending colon: Secondary | ICD-10-CM

## 2019-04-24 MED ORDER — SODIUM CHLORIDE 0.9 % IV SOLN: 500.0000 mL | Freq: Once | INTRAVENOUS | Status: DC

## 2019-04-24 NOTE — Progress Notes (Signed)
Called to room to assist during endoscopic procedure.  Patient ID and intended procedure confirmed with present staff. Received instructions for my participation in the procedure from the performing physician.  

## 2019-04-24 NOTE — Op Note (Signed)
[facility] Patient Name: Marcus Callahan Procedure Date: 04/24/2019 10:47 AM MRN: [id number] Endoscopist: Justice Britain , MD Age: 48 Referring MD:  Date of Birth: 1971-05-18 Gender: Male Account #: [id number] Procedure:                Colonoscopy Indications:              Screening for malignant neoplasm in the colon Medicines:                Monitored Anesthesia Care Procedure:                Pre-Anesthesia Assessment:                           - Prior to the procedure, a History and Physical                            was performed, and patient medications and                            allergies were reviewed. The patient's tolerance of                            previous anesthesia was also reviewed. The risks                            and benefits of the procedure and the sedation                            options and risks were discussed with the patient.                            All questions were answered, and informed consent                            was obtained. Prior Anticoagulants: The patient has                            taken no previous anticoagulant or antiplatelet                            agents. ASA Grade Assessment: II - A patient with                            mild systemic disease. After reviewing the risks                            and benefits, the patient was deemed in                            satisfactory condition to undergo the procedure.                           After obtaining informed consent, the colonoscope  was passed under direct vision. Throughout the                            procedure, the patient's blood pressure, pulse, and                            oxygen saturations were monitored continuously. The                            Colonoscope was introduced through the anus and                            advanced to the 5 cm into the ileum. The                            colonoscopy was  performed without difficulty. The                            patient tolerated the procedure. The quality of the                            bowel preparation was good. The terminal ileum,                            ileocecal valve, appendiceal orifice, and rectum                            were photographed. Scope In: 11:00:19 AM Scope Out: 11:17:30 AM Scope Withdrawal Time: 0 hours 15 minutes 0 seconds  Total Procedure Duration: 0 hours 17 minutes 11 seconds  Findings:                 The digital rectal exam findings include                            hemorrhoids. Pertinent negatives include no                            palpable rectal lesions.                           The terminal ileum and ileocecal valve appeared                            normal.                           Four sessile polyps were found in the rectum (1),                            descending colon (1) and ascending colon (2). The                            polyps were 2 to 5 mm in size. These polyps were  removed with a cold snare. Resection and retrieval                            were complete.                           Multiple small-mouthed diverticula were found in                            the entire colon.                           Normal mucosa was found in the entire colon                            otherwise.                           Non-bleeding non-thrombosed internal hemorrhoids                            were found during retroflexion, during perianal                            exam and during digital exam. The hemorrhoids were                            Grade II (internal hemorrhoids that prolapse but                            reduce spontaneously). Complications:            No immediate complications. Estimated Blood Loss:     Estimated blood loss was minimal. Impression:               - Hemorrhoids found on digital rectal exam.                           - The  examined portion of the ileum was normal.                           - Four 2 to 5 mm polyps in the rectum, in the                            descending colon and in the ascending colon,                            removed with a cold snare. Resected and retrieved.                           - Diverticulosis in the entire examined colon.                           - Normal mucosa in the entire examined colon.                           -   Non-bleeding non-thrombosed internal hemorrhoids. Recommendation:           - The patient will be observed post-procedure,                            until all discharge criteria are met.                           - Discharge patient to home.                           - Patient has a contact number available for                            emergencies. The signs and symptoms of potential                            delayed complications were discussed with the                            patient. Return to normal activities tomorrow.                            Written discharge instructions were provided to the                            patient.                           - High fiber diet.                           - Use FiberCon 1 tablet PO daily.                           - Continue present medications.                           - Await pathology results.                           - Repeat colonoscopy 3/11/05/08 years for                            surveillance based on pathology results and                            findings of adenomatous tissue.                           - The findings and recommendations were discussed                            with the patient. Gabriel Mansouraty, MD 04/24/2019 11:22:43 AM 

## 2019-04-24 NOTE — Progress Notes (Signed)
Report to PACU, RN, vss, BBS= Clear.  

## 2019-04-24 NOTE — Progress Notes (Signed)
Pt's states no medical or surgical changes since previsit or office visit. 

## 2019-04-24 NOTE — Patient Instructions (Signed)
YOU HAD AN ENDOSCOPIC PROCEDURE TODAY AT THE [facility]:   Refer to the procedure report that was given to you for any specific questions about what was found during the examination.  If the procedure report does not answer your questions, please call your gastroenterologist to clarify.  If you requested that your care partner not be given the details of your procedure findings, then the procedure report has been included in a sealed envelope for you to review at your convenience later.  YOU SHOULD EXPECT: Some feelings of bloating in the abdomen. Passage of more gas than usual.  Walking can help get rid of the air that was put into your GI tract during the procedure and reduce the bloating. If you had a lower endoscopy (such as a colonoscopy or flexible sigmoidoscopy) you may notice spotting of blood in your stool or on the toilet paper. If you underwent a bowel prep for your procedure, you may not have a normal bowel movement for a few days.  Please Note:  You might notice some irritation and congestion in your nose or some drainage.  This is from the oxygen used during your procedure.  There is no need for concern and it should clear up in a day or so.  SYMPTOMS TO REPORT IMMEDIATELY:   Following lower endoscopy (colonoscopy or flexible sigmoidoscopy):  Excessive amounts of blood in the stool  Significant tenderness or worsening of abdominal pains  Swelling of the abdomen that is new, acute  Fever of 100F or higher  For urgent or emergent issues, a gastroenterologist can be reached at any hour by calling [phone].   DIET:  We do recommend a small meal at first, but then you may proceed to your regular diet.  Drink plenty of fluids but you should avoid alcoholic beverages for 24 hours.  ACTIVITY:  You should plan to take it easy for the rest of today and you should NOT DRIVE or use heavy machinery until tomorrow (because of the sedation medicines used during the test).     FOLLOW UP: Our staff will call the number listed on your records 48-72 hours following your procedure to check on you and address any questions or concerns that you may have regarding the information given to you following your procedure. If we do not reach you, we will leave a message.  We will attempt to reach you two times.  During this call, we will ask if you have developed any symptoms of COVID 19. If you develop any symptoms (ie: fever, flu-like symptoms, shortness of breath, cough etc.) before then, please call [phone].  If you test positive for Covid 19 in the 2 weeks post procedure, please call and report this information to us.    If any biopsies were taken you will be contacted by phone or by letter within the next 1-3 weeks.  Please call us at [phone] if you have not heard about the biopsies in 3 weeks.    SIGNATURES/CONFIDENTIALITY: You and/or your care partner have signed paperwork which will be entered into your electronic medical record.  These signatures attest to the fact that that the information above on your After Visit Summary has been reviewed and is understood.  Full responsibility of the confidentiality of this discharge information lies with you and/or your care-partner. 

## 2019-04-28 ENCOUNTER — Encounter: Payer: Self-pay | Admitting: Gastroenterology

## 2019-04-28 ENCOUNTER — Telehealth: Payer: Self-pay

## 2019-04-28 NOTE — Telephone Encounter (Signed)
  Follow up Call-  Call back number 04/24/2019  Post procedure Call Back phone  # [phone]  Permission to leave phone message Yes  Some recent data might be hidden     Patient questions:  Do you have a fever, pain , or abdominal swelling? No. Pain Score  0 *  Have you tolerated food without any problems? Yes.    Have you been able to return to your normal activities? Yes.    Do you have any questions about your discharge instructions: Diet   No. Medications  No. Follow up visit  No.  Do you have questions or concerns about your Care? No.  Actions: * If pain score is 4 or above: No action needed, pain <4. 1. Have you developed a fever since your procedure? no  2.   Have you had an respiratory symptoms (SOB or cough) since your procedure? no  3.   Have you tested positive for COVID 19 since your procedure no  4.   Have you had any family members/close contacts diagnosed with the COVID 19 since your procedure?  no   If yes to any of these questions please route to Joylene John, RN and Alphonsa Gin, Therapist, sports.

## 2019-07-05 ENCOUNTER — Encounter: Payer: Self-pay | Admitting: Emergency Medicine

## 2019-07-06 ENCOUNTER — Encounter: Payer: Self-pay | Admitting: Family Medicine

## 2019-07-06 ENCOUNTER — Ambulatory Visit: Payer: BC Managed Care – PPO | Admitting: Family Medicine

## 2019-07-06 ENCOUNTER — Other Ambulatory Visit: Payer: Self-pay

## 2019-07-06 VITALS — BP 132/81 | HR 83 | Temp 98.5°F | Wt 242.0 lb

## 2019-07-06 DIAGNOSIS — R310 Gross hematuria: Secondary | ICD-10-CM

## 2019-07-06 DIAGNOSIS — N179 Acute kidney failure, unspecified: Secondary | ICD-10-CM

## 2019-07-06 LAB — POCT URINALYSIS DIP (MANUAL ENTRY)
Bilirubin, UA: NEGATIVE
Glucose, UA: NEGATIVE mg/dL
Ketones, POC UA: NEGATIVE mg/dL
Leukocytes, UA: NEGATIVE
Nitrite, UA: NEGATIVE
Protein Ur, POC: 30 mg/dL — AB
Spec Grav, UA: 1.02 (ref 1.010–1.025)
Urobilinogen, UA: 0.2 E.U./dL
pH, UA: 5 (ref 5.0–8.0)

## 2019-07-06 LAB — POC MICROSCOPIC URINALYSIS (UMFC): Mucus: ABSENT

## 2019-07-06 NOTE — Progress Notes (Signed)
1/4/20214:31 PM  Marcus Callahan July 17, 1970, 49 y.o., male [id number]  Chief Complaint  Patient presents with  . Hematuria    off/on for the past 3 days now. No pain. Started taking an multigrain vitamin not sure this casued the blood in urine    HPI:   Patient is a 49 y.o. male with past medical history significant for HTN who presents today for gross hematuria  For past several days has noticed intermittent presence of blood in urine No pain with urination, urgency or frequency No nocturia, no weak stream, no retention He has had blood in his urine in the past, dx with HTN No known kidney problems He has never smoked Not on blood thinners Recently started a mvit No pain with BM Father diagnosed with prostate cancer at age 1  Depression screen PHQ 2/9 07/06/2019 03/16/2019 12/09/2018  Decreased Interest 0 0 0  Down, Depressed, Hopeless 0 0 0  PHQ - 2 Score 0 0 0    Fall Risk  07/06/2019 03/16/2019 12/09/2018 11/18/2018  Falls in the past year? 0 0 0 0  Number falls in past yr: 0 - - -  Injury with Fall? 0 - - -  Follow up Falls evaluation completed Falls evaluation completed Falls evaluation completed Falls evaluation completed     Allergies  Allergen Reactions  . Codeine Other (See Comments)    Nose bleeds    Prior to Admission medications   Medication Sig Start Date End Date Taking? Authorizing Provider  lisinopril-hydrochlorothiazide (ZESTORETIC) 20-25 MG tablet Take 1 tablet by mouth daily. 11/18/18  Yes Sagardia, Ines Bloomer, MD  labetalol (NORMODYNE) 100 MG tablet Take 1 tablet (100 mg total) by mouth 2 (two) times daily. 12/09/18 04/09/19  [name], MD    Past Medical History:  Diagnosis Date  . Allergy    no diagnosis per pt   . Hypertension     Past Surgical History:  Procedure Laterality Date  . surgery as a child     thinks matbe was a hernia repair as a premature baby     Social History   Tobacco Use  . Smoking status: Never Smoker   . Smokeless tobacco: Never Used  Substance Use Topics  . Alcohol use: Yes    Comment: occassionaly    Family History  Problem Relation Age of Onset  . Colon polyps Mother   . Colon cancer Neg Hx   . Esophageal cancer Neg Hx   . Prostate cancer Neg Hx   . Rectal cancer Neg Hx     ROS Per hpi  OBJECTIVE:  Today's Vitals   07/06/19 1617  BP: 132/81  Pulse: 83  Temp: 98.5 F (36.9 C)  TempSrc: Oral  SpO2: 97%  Weight: 242 lb (109.8 kg)   Body mass index is 34.72 kg/m.   Physical Exam Vitals and nursing note reviewed.  Constitutional:      Appearance: He is well-developed.  HENT:     Head: Normocephalic and atraumatic.  Eyes:     Conjunctiva/sclera: Conjunctivae normal.     Pupils: Pupils are equal, round, and reactive to light.  Cardiovascular:     Rate and Rhythm: Normal rate and regular rhythm.     Heart sounds: No murmur. No friction rub. No gallop.   Pulmonary:     Effort: Pulmonary effort is normal.     Breath sounds: Normal breath sounds. No wheezing, rhonchi or rales.  Abdominal:     General: Bowel sounds  are normal. There is no distension.     Palpations: Abdomen is soft.     Tenderness: There is no abdominal tenderness. There is no right CVA tenderness or left CVA tenderness.  Musculoskeletal:     Cervical back: Neck supple.  Skin:    General: Skin is warm and dry.  Neurological:     Mental Status: He is alert and oriented to person, place, and time.       Results for orders placed or performed in visit on 07/06/19 (from the past 24 hour(s))  POCT urinalysis dipstick     Status: Abnormal   Collection Time: 07/06/19  4:27 PM  Result Value Ref Range   Color, UA yellow yellow   Clarity, UA cloudy (A) clear   Glucose, UA negative negative mg/dL   Bilirubin, UA negative negative   Ketones, POC UA negative negative mg/dL   Spec Grav, UA 1.020 1.010 - 1.025   Blood, UA moderate (A) negative   pH, UA 5.0 5.0 - 8.0   Protein Ur, POC =30 (A)  negative mg/dL   Urobilinogen, UA 0.2 0.2 or 1.0 E.U./dL   Nitrite, UA Negative Negative   Leukocytes, UA Negative Negative  POCT Urinalysis Microscopic ([facility])     Status: Abnormal   Collection Time: 07/06/19  4:50 PM  Result Value Ref Range   WBC,UR,HPF,POC None None WBC/hpf   RBC,UR,HPF,POC Too numerous to count  (A) None RBC/hpf   Bacteria None None, Too numerous to count   Mucus Absent Absent   Epithelial Cells, UR Per Microscopy None None, Too numerous to count cells/hpf    No results found.   ASSESSMENT and PLAN  1. Gross hematuria UA/micro not indicative of infection. Non smoker. Urcx and other labs pending. Referring to urology for further eval and treatment. - POCT urinalysis dipstick - POCT Urinalysis Microscopic ([facility]) - PSA - Urine Culture - Basic Metabolic Panel - Ambulatory referral to Urology  Return for with PCP after you have seen urology.    Rutherford Guys, MD [facility] at [facility] [place], [place] [address] Ph.  [phone] Fax [phone]

## 2019-07-06 NOTE — Patient Instructions (Signed)
° ° ° °  If you have lab work done today you will be contacted with your lab results within the next 2 weeks.  If you have not heard from us then please contact us. The fastest way to get your results is to register for My Chart. ° ° °IF you received an x-ray today, you will receive an invoice from [facility]. Please contact [facility] at [phone] with questions or concerns regarding your invoice.  ° °IF you received labwork today, you will receive an invoice from [facility]. Please contact [facility] at [phone] with questions or concerns regarding your invoice.  ° °Our billing staff will not be able to assist you with questions regarding bills from these companies. ° °You will be contacted with the lab results as soon as they are available. The fastest way to get your results is to activate your My Chart account. Instructions are located on the last page of this paperwork. If you have not heard from us regarding the results in 2 weeks, please contact this office. °  ° ° ° °

## 2019-07-07 ENCOUNTER — Telehealth: Payer: Self-pay | Admitting: Emergency Medicine

## 2019-07-07 LAB — BASIC METABOLIC PANEL
BUN/Creatinine Ratio: 11 (ref 9–20)
BUN: 21 mg/dL (ref 6–24)
CO2: 22 mmol/L (ref 20–29)
Calcium: 9.3 mg/dL (ref 8.7–10.2)
Chloride: 97 mmol/L (ref 96–106)
Creatinine, Ser: 1.96 mg/dL — ABNORMAL HIGH (ref 0.76–1.27)
GFR calc Af Amer: 45 mL/min/{1.73_m2} — ABNORMAL LOW (ref >59)
GFR calc non Af Amer: 39 mL/min/{1.73_m2} — ABNORMAL LOW (ref >59)
Glucose: 97 mg/dL (ref 65–99)
Potassium: 4.1 mmol/L (ref 3.5–5.2)
Sodium: 134 mmol/L (ref 134–144)

## 2019-07-07 LAB — URINE CULTURE: Organism ID, Bacteria: NO GROWTH

## 2019-07-07 LAB — PSA: Prostate Specific Ag, Serum: 1.2 ng/mL (ref 0.0–4.0)

## 2019-07-07 NOTE — Telephone Encounter (Signed)
Pt returning call  on results \  Please advise

## 2019-07-07 NOTE — Addendum Note (Signed)
Addended by: Rutherford Guys on: 07/07/2019 01:54 PM   Modules accepted: Orders

## 2019-07-08 ENCOUNTER — Encounter: Payer: Self-pay | Admitting: Family Medicine

## 2019-07-08 ENCOUNTER — Encounter: Payer: Self-pay | Admitting: Radiology

## 2019-07-10 NOTE — Telephone Encounter (Signed)
Pt has already received the results and has a urology appt on 07/31/2019.

## 2019-07-13 DIAGNOSIS — Z20828 Contact with and (suspected) exposure to other viral communicable diseases: Secondary | ICD-10-CM | POA: Diagnosis not present

## 2019-07-13 DIAGNOSIS — Z03818 Encounter for observation for suspected exposure to other biological agents ruled out: Secondary | ICD-10-CM | POA: Diagnosis not present

## 2019-07-30 DIAGNOSIS — R310 Gross hematuria: Secondary | ICD-10-CM | POA: Diagnosis not present

## 2019-08-11 DIAGNOSIS — R310 Gross hematuria: Secondary | ICD-10-CM | POA: Diagnosis not present

## 2019-08-12 ENCOUNTER — Other Ambulatory Visit (HOSPITAL_COMMUNITY): Payer: Self-pay | Admitting: Urology

## 2019-08-12 DIAGNOSIS — D49512 Neoplasm of unspecified behavior of left kidney: Secondary | ICD-10-CM

## 2019-08-13 ENCOUNTER — Other Ambulatory Visit: Payer: Self-pay | Admitting: Urology

## 2019-08-13 ENCOUNTER — Other Ambulatory Visit: Payer: Self-pay

## 2019-08-13 ENCOUNTER — Ambulatory Visit
Admission: RE | Admit: 2019-08-13 | Discharge: 2019-08-13 | Disposition: A | Discharge status: Home / Self Care | Payer: BC Managed Care – PPO | Source: Ambulatory Visit | Attending: Urology | Admitting: Urology

## 2019-08-13 DIAGNOSIS — N2889 Other specified disorders of kidney and ureter: Secondary | ICD-10-CM | POA: Diagnosis not present

## 2019-08-13 DIAGNOSIS — D49512 Neoplasm of unspecified behavior of left kidney: Secondary | ICD-10-CM | POA: Insufficient documentation

## 2019-08-13 IMAGING — MR MR ABDOMEN WO/W CM
18 series · 48 of 48 positions shown · IV contrast (gadavist)
Comparison: CT [DATE]

CLINICAL DATA: LEFT renal mass

EXAM:
MRI ABDOMEN WITHOUT AND WITH CONTRAST
TECHNIQUE: Multiplanar multisequence MR imaging of the abdomen was performed
both before and after the administration of intravenous contrast.
CONTRAST:  10mL GADAVIST GADOBUTROL 1 MMOL/ML IV SOLN

[Series 3: cor haste · coronal · 6.5mm · 1.19mm/px · 2 of 35 slices shown]
[im 1/35]
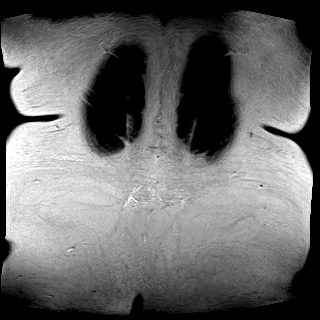
[im 35/35]
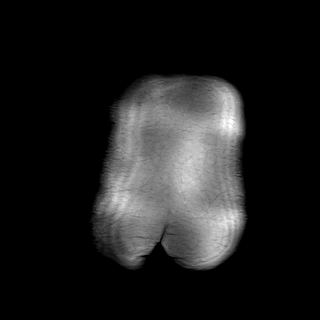

[Series 6: T2 fat-sat · axial · 7.0mm · 1.19mm/px · z∈[-172,+170]mm · 2 of 40 slices shown]
[im 1/40]
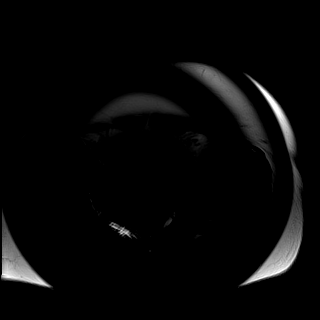
[im 40/40]
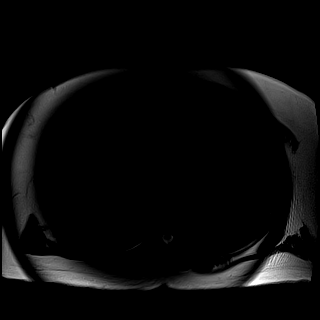

[Series 7: DWI · axial · 7.0mm · 1.42mm/px · z∈[-172,+170]mm · 4 of 120 slices shown (1 of 2)]
[im 1/120]
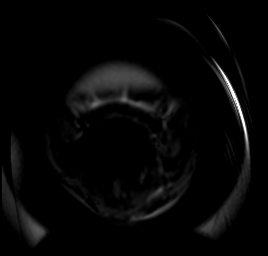
[im 40/120]
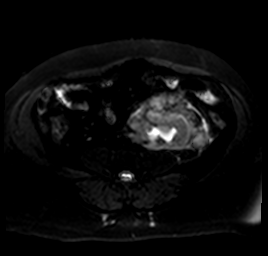
[im 80/120]
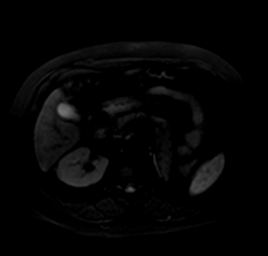
[im 120/120]
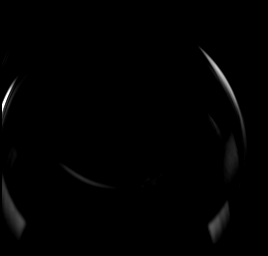

[Series 8: DWI · axial · 7.0mm · 1.42mm/px · z∈[-172,+170]mm · 2 of 40 slices shown (2 of 2)]
[im 1/40]
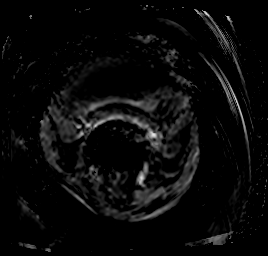
[im 40/40]
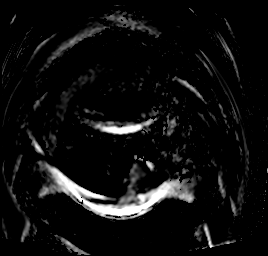

[Series 9: bSSFP · axial · 7.0mm · 0.74mm/px · 1 of 40 slices shown]
[im 1/40]
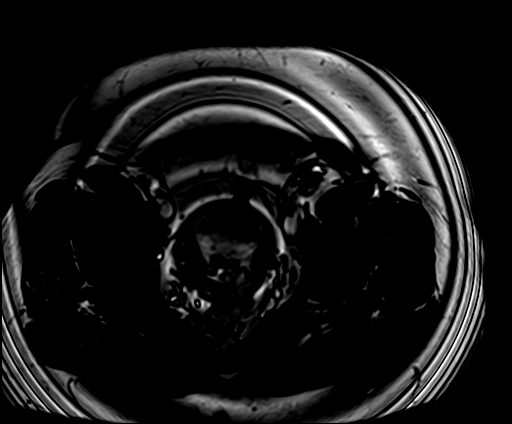

[Series 10: ax in & · axial · 3.0mm · 1.19mm/px · z∈[-178,+179]mm · 3 of 120 slices shown (1 of 2)]
[im 1/120]
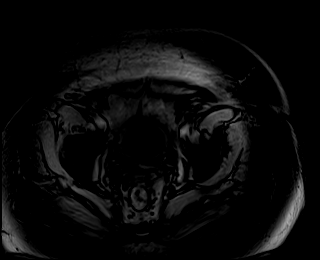
[im 60/120]
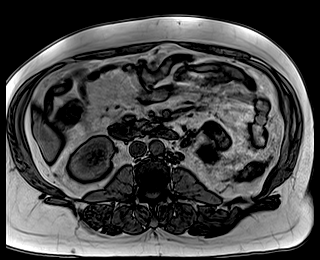
[im 120/120]
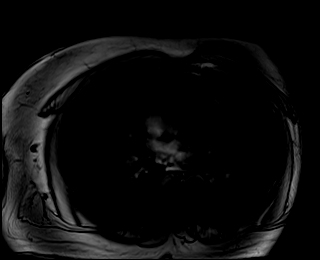

[Series 10: ax in & · axial · 3.0mm · 1.19mm/px · z∈[-178,+179]mm · 3 of 120 slices shown (2 of 2)]
[im 1/120]
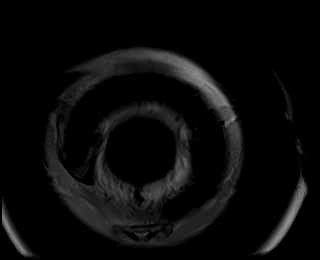
[im 60/120]
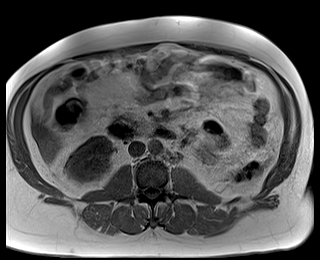
[im 120/120]
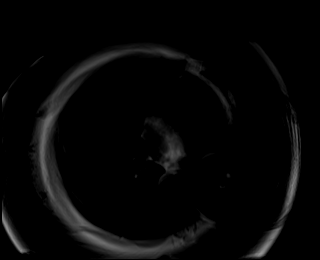

[Series 11: T1 dynamic · axial · non-contrast · 3.0mm · 1.19mm/px · z∈[-166,+167]mm · 3 of 112 slices shown (1 of 9)]
[im 1/112]
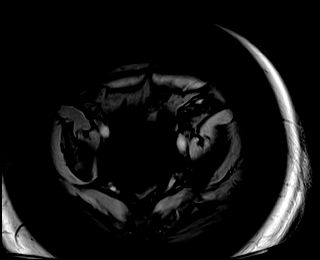
[im 56/112]
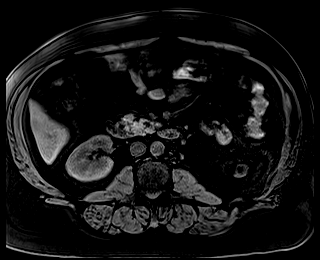
[im 112/112]
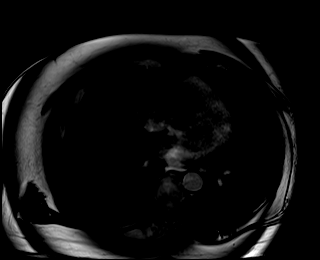

[Series 12: T1 dynamic · axial · 3.0mm · 1.19mm/px · z∈[-166,+167]mm · 3 of 112 slices shown (2 of 9)]
[im 1/112]
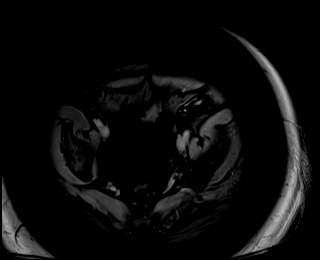
[im 56/112]
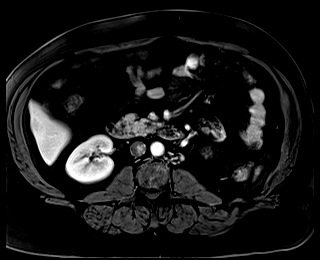
[im 112/112]
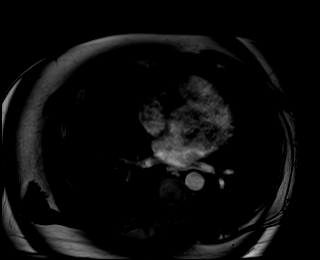

[Series 13: T1 dynamic · axial · 3.0mm · 1.19mm/px · z∈[-166,+167]mm · 3 of 112 slices shown (3 of 9)]
[im 1/112]
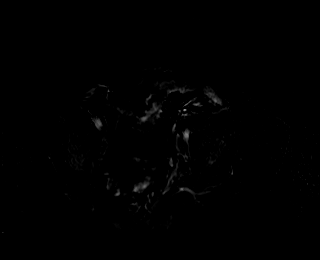
[im 56/112]
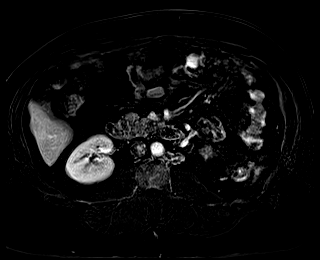
[im 112/112]
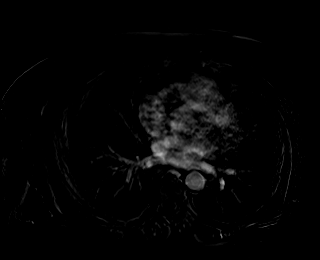

[Series 14: T1 dynamic · axial · 3.0mm · 1.19mm/px · z∈[-166,+167]mm · 3 of 112 slices shown (4 of 9)]
[im 1/112]
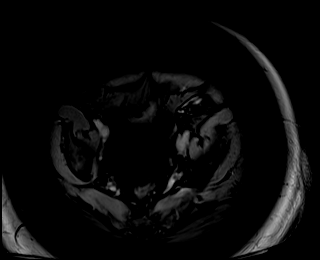
[im 56/112]
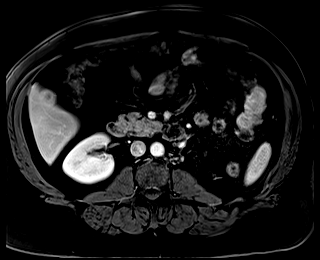
[im 112/112]
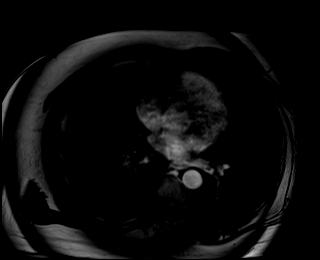

[Series 15: T1 dynamic · axial · 3.0mm · 1.19mm/px · z∈[-166,+167]mm · 3 of 112 slices shown (5 of 9)]
[im 1/112]
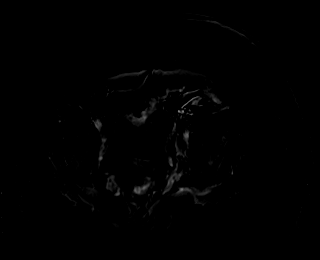
[im 56/112]
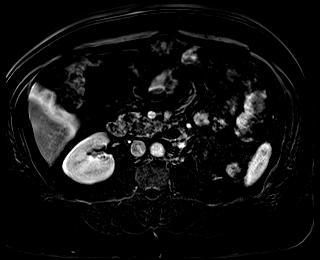
[im 112/112]
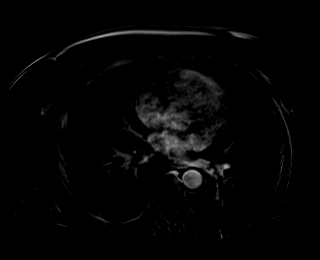

[Series 16: T1 dynamic · axial · 3.0mm · 1.19mm/px · z∈[-166,+167]mm · 3 of 112 slices shown (6 of 9)]
[im 1/112]
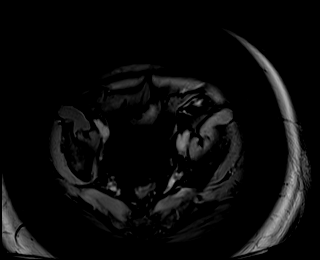
[im 56/112]
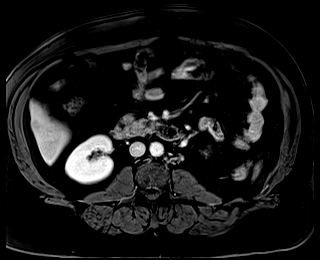
[im 112/112]
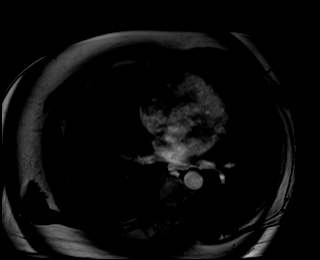

[Series 17: T1 dynamic · axial · 3.0mm · 1.19mm/px · z∈[-166,+167]mm · 3 of 112 slices shown (7 of 9)]
[im 1/112]
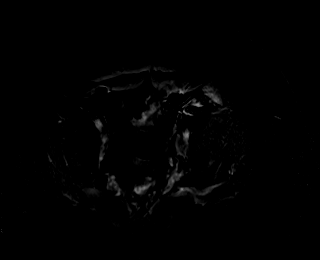
[im 56/112]
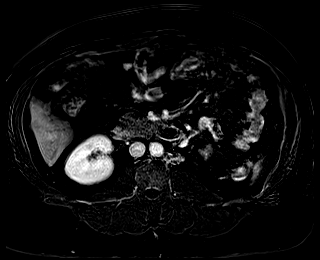
[im 112/112]
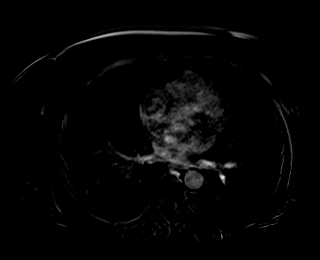

[Series 18: T1 dynamic post-contrast · coronal · 3.0mm · 1.31mm/px · 3 of 88 slices shown]
[im 1/88]
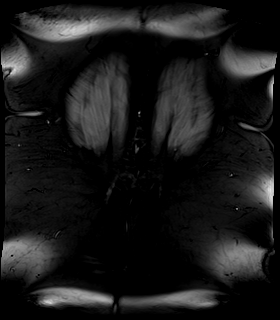
[im 44/88]
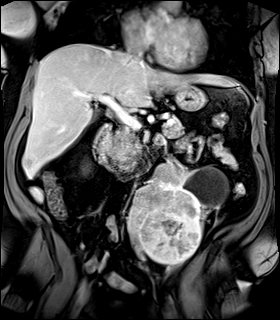
[im 88/88]
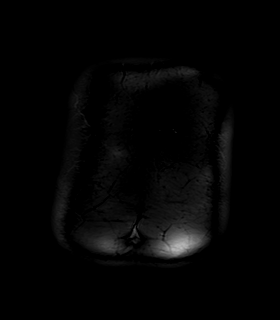

[Series 19: T2 · axial · 7.0mm · 1.19mm/px · 1 of 40 slices shown]
[im 1/40]
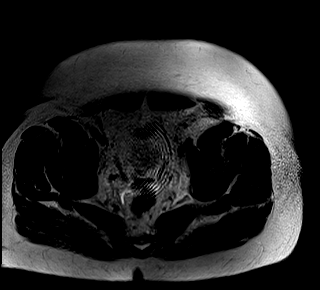

[Series 20: T1 dynamic · axial · 3.0mm · 1.19mm/px · z∈[-166,+167]mm · 3 of 112 slices shown (8 of 9)]
[im 1/112]
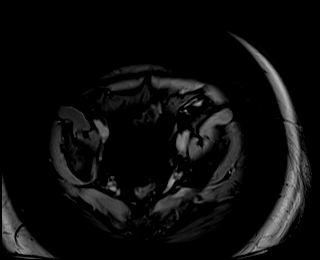
[im 56/112]
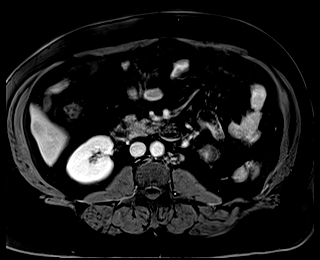
[im 112/112]
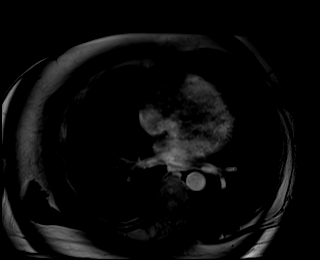

[Series 21: T1 dynamic · axial · 3.0mm · 1.19mm/px · z∈[-166,+167]mm · 3 of 112 slices shown (9 of 9)]
[im 1/112]
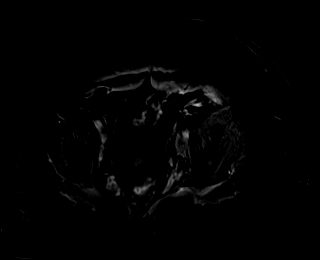
[im 56/112]
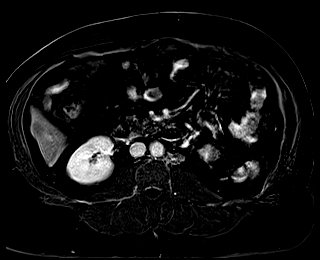
[im 112/112]
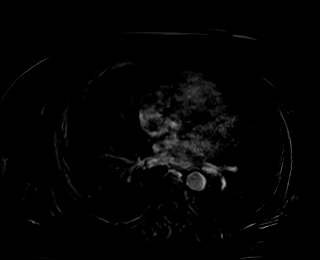

[48 of 48 positions shown; findings below may reference images not displayed]

FINDINGS: Lower chest:  Lung bases are clear.

Hepatobiliary: Multiple round enhancing lesions within the liver
parenchyma. Lesions have peripheral enhancement in low central
intensity. Example 2.5 cm lesion LEFT hepatic lobe (image [DATE]).
1.6 cm lesion in the inferior RIGHT hepatic lobe on image 51/12.
Lesions also evident as T2 hyperintensity. Approximately 12 lesions
in liver involving LEFT and RIGHT hepatic lobes.

Gallbladder normal.  Biliary tree normal.

Pancreas: Normal pancreatic parenchymal intensity. No ductal
dilatation or inflammation.

Spleen: Normal spleen.

Adrenals/urinary tract: Adrenal glands are normal.

Large enhancing mass extending from the lower pole of the LEFT
kidney measures 8.9 x 8.8 by 9.5 cm. The mass appears contained
within the pararenal fascia. No extension into the LEFT renal vein.

There is blood product within the LEFT collecting system and renal
pelvis. There is enhancing tissue within the renal pelvis and
proximal ureter. The proximal ureters expanded by this enhancing
tissue to 16 mm on image 46/18. Collecting system tumor extension
also seen on coronal image [DATE] (T2 weighted imaging).

Potential small LEFT periaortic lymph node measuring 8 mm on image
62/12.

RIGHT kidney normal. Bladder not imaged in entirety but appears
normal.

Stomach/Bowel: Stomach and limited of the small bowel is
unremarkable

Vascular/Lymphatic: Abdominal aortic normal caliber. No
retroperitoneal periportal lymphadenopathy.

Musculoskeletal: No aggressive osseous lesion
IMPRESSION: 1. Large enhancing mass extending from the lower pole of the LEFT
kidney consistent with renal cell carcinoma. Tumor mass appears
contained within the pararenal fascia. No renal vein invasion
identified.
2. Tumor extension into the LEFT renal pelvis and proximal LEFT
ureter.
3. Multifocal hepatic metastasis involving both hepatic lobes (stage
IV carcinoma).

## 2019-08-13 MED ORDER — GADOBUTROL 1 MMOL/ML IV SOLN
10.0000 mL | Freq: Once | INTRAVENOUS | Status: AC | PRN
  Administered 2019-08-13: 12:00:00 10 mL via INTRAVENOUS

## 2019-08-14 ENCOUNTER — Telehealth: Payer: Self-pay | Admitting: Oncology

## 2019-08-14 ENCOUNTER — Other Ambulatory Visit (HOSPITAL_COMMUNITY): Payer: Self-pay | Admitting: Urology

## 2019-08-14 ENCOUNTER — Ambulatory Visit (HOSPITAL_COMMUNITY)
Admission: RE | Admit: 2019-08-14 | Discharge: 2019-08-14 | Disposition: A | Discharge status: Home / Self Care | Payer: BC Managed Care – PPO | Source: Ambulatory Visit | Attending: Urology | Admitting: Urology

## 2019-08-14 DIAGNOSIS — D49512 Neoplasm of unspecified behavior of left kidney: Secondary | ICD-10-CM

## 2019-08-14 DIAGNOSIS — Z01818 Encounter for other preprocedural examination: Secondary | ICD-10-CM | POA: Diagnosis not present

## 2019-08-14 IMAGING — DX DG CHEST 2V
2 series · 2 of 2 positions shown · non-contrast
Comparison: [DATE] chest radiograph

CLINICAL DATA: Preoperative chest radiograph prior to nephrectomy.

EXAM:
CHEST - 2 VIEW

[chest pa]
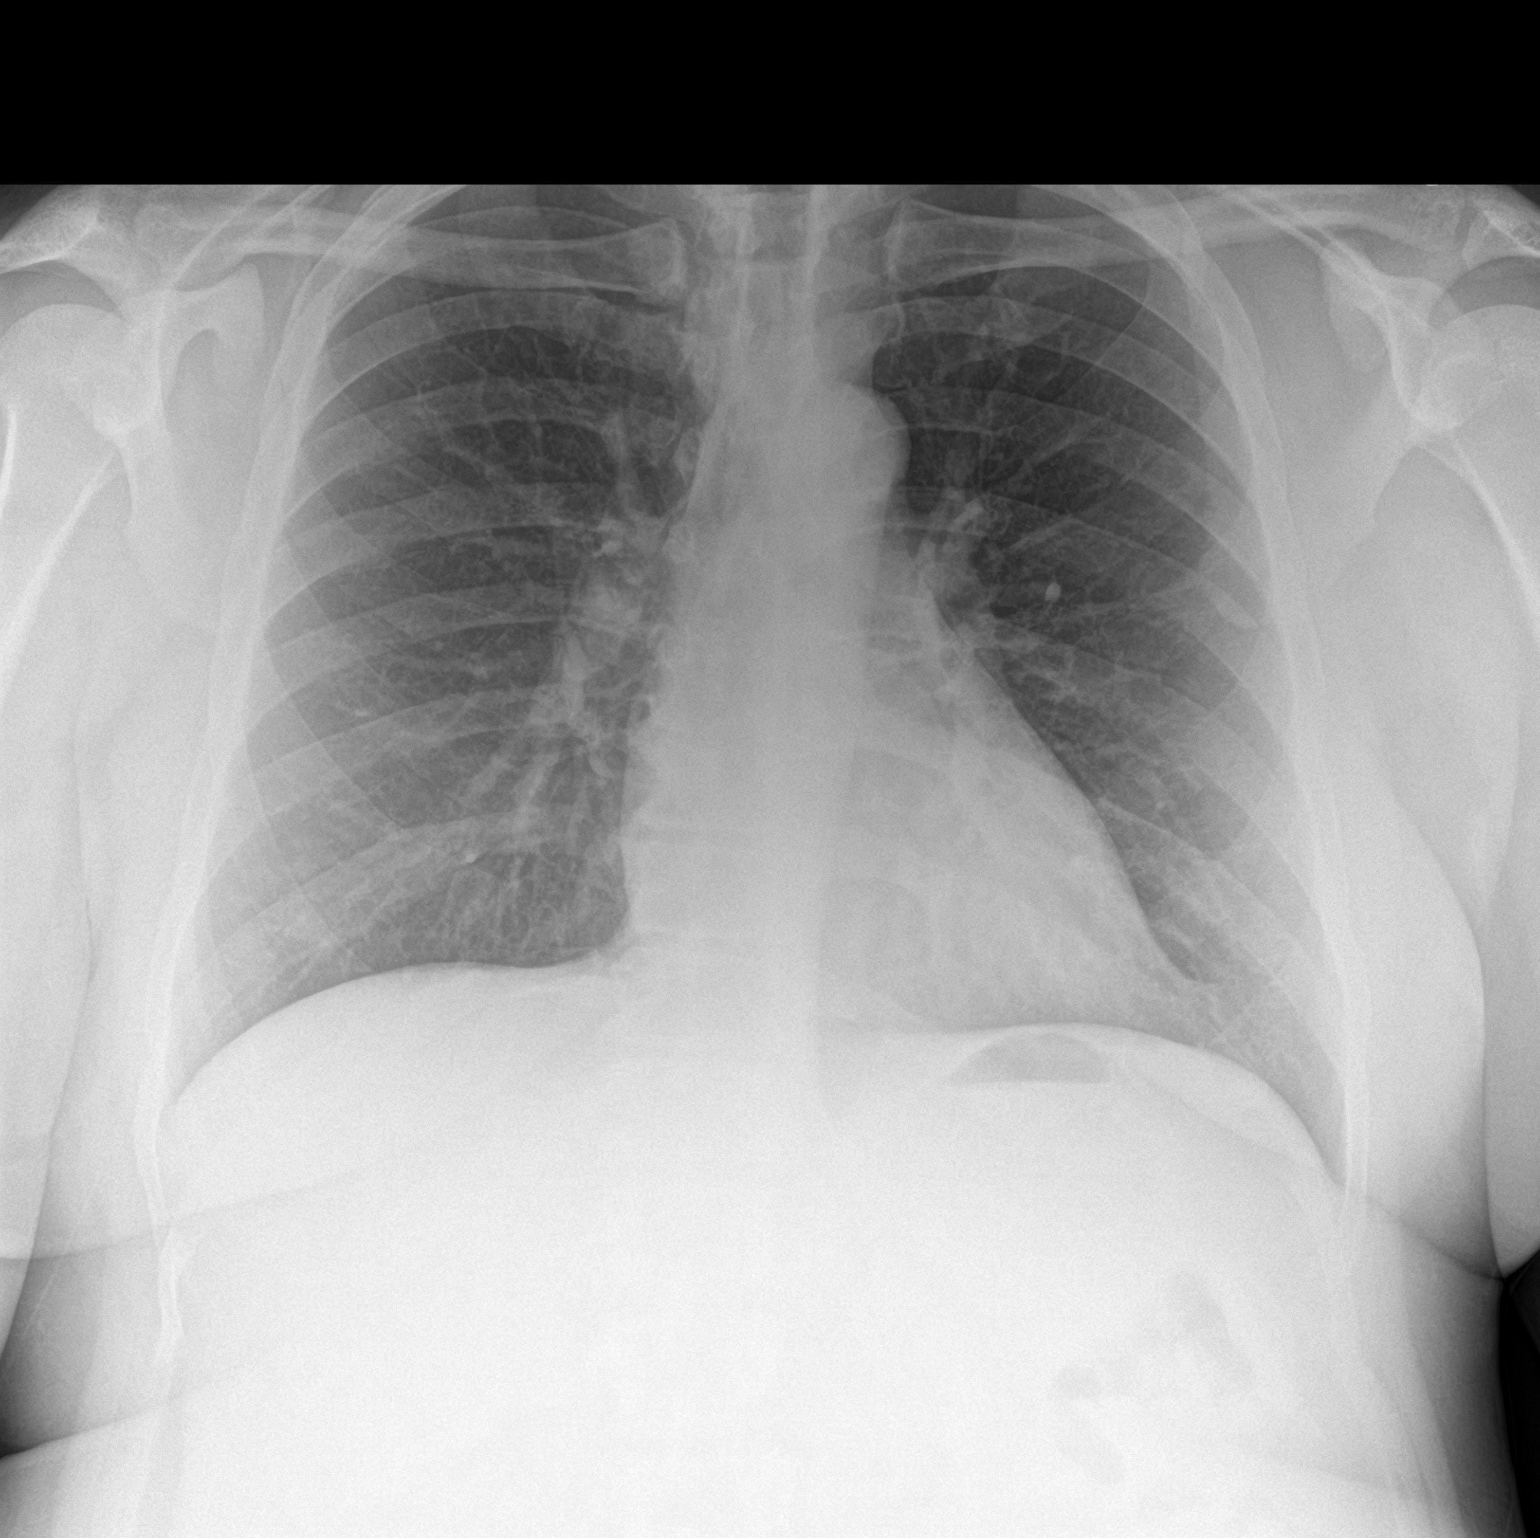

[chest lat]
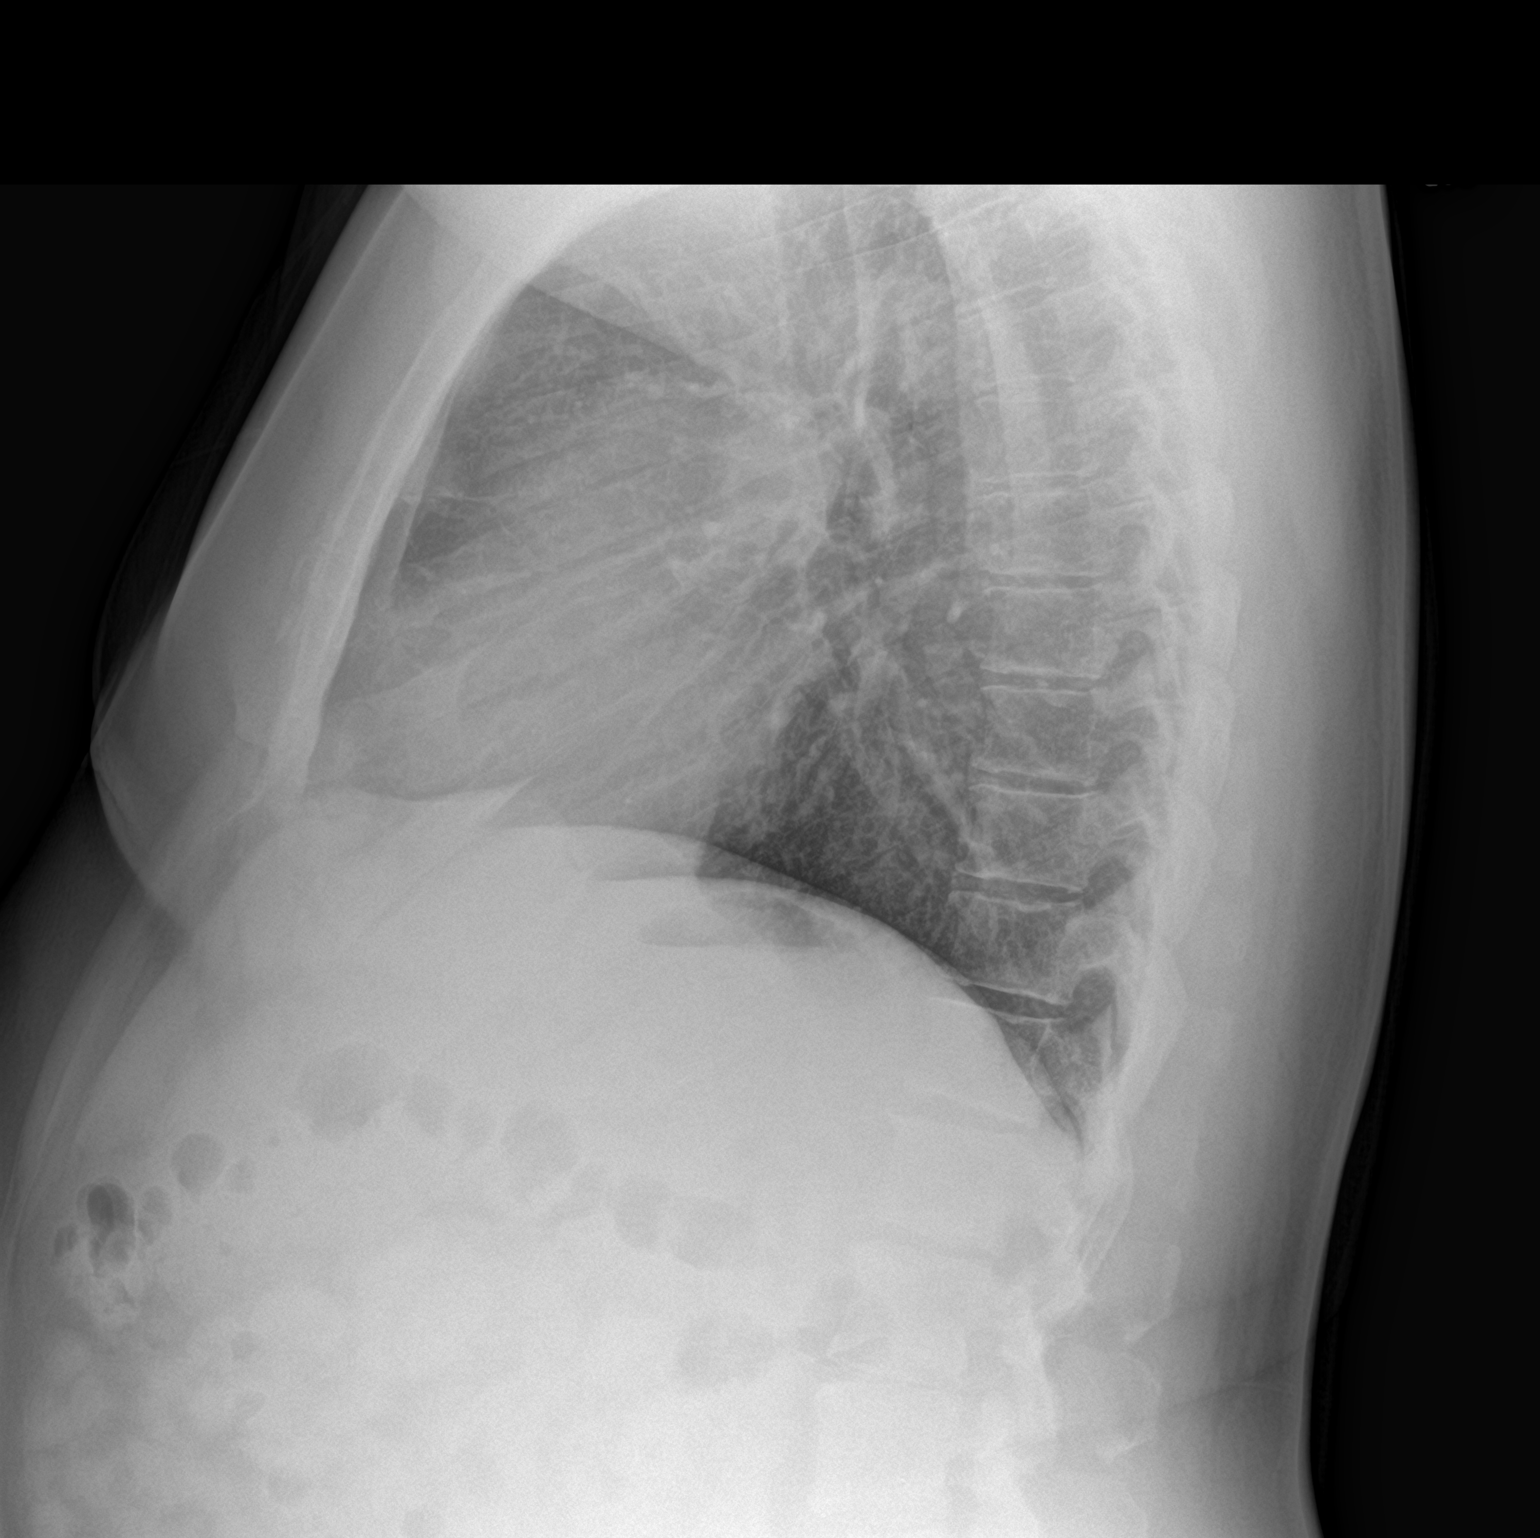

[2 of 2 positions shown; findings below may reference images not displayed]

FINDINGS: The cardiomediastinal silhouette is unremarkable.

There is no evidence of focal airspace disease, pulmonary edema,
suspicious pulmonary nodule/mass, pleural effusion, or pneumothorax.

No acute bony abnormalities are identified.
IMPRESSION: No active cardiopulmonary disease.

## 2019-08-14 NOTE — Telephone Encounter (Signed)
Received a new pt referral from Dr. Lovena Neighbours at [facility] for assess for presumed metastatic RCC. Marcus Callahan has been cld and scheduled to see Dr. Alen Blew on 2/16 at 11am. Pt aware to arrive 15 minutes early.

## 2019-08-17 DIAGNOSIS — D49512 Neoplasm of unspecified behavior of left kidney: Secondary | ICD-10-CM | POA: Diagnosis not present

## 2019-08-18 ENCOUNTER — Inpatient Hospital Stay: Payer: BC Managed Care – PPO | Attending: Oncology | Admitting: Oncology

## 2019-08-18 ENCOUNTER — Other Ambulatory Visit: Payer: Self-pay

## 2019-08-18 VITALS — BP 155/86 | HR 76 | Temp 97.9°F | Resp 18 | Ht 70.0 in | Wt 237.6 lb

## 2019-08-18 DIAGNOSIS — N2889 Other specified disorders of kidney and ureter: Secondary | ICD-10-CM

## 2019-08-18 NOTE — Progress Notes (Signed)
Reason for the request:   Kidney mass  HPI: I was asked by Dr. Lovena Neighbours to evaluate [name] for suspicious for kidney tumor.  He is a 49 year old man presented with gross hematuria in January 2021.  He did have a similar episode in May 2020 which has resolved spontaneously.  He was evaluated by his primary care physician and referred to Dr. Lovena Neighb[facility].  His evaluation included a CT scan obtained on August 11, 2019.  He subsequently underwent MRI of the abdomen with and without contrast completed on [place] 11th 2021.  The MRI showed a large enhancing mass extending from the lower pole of the left kidney measuring 8.9 x 8.8 x 9.5 cm.  No extension into the left renal vein.  Potential small left periaortic lymph node measuring 8 mm was noted.  Multiple enhancing lesions within the liver noted with 2.5 cm in the left hepatic lobe and 1.6 cm in the right hepatic lobe.  12 lesions involving the left and the right collection total.  Chest x-ray did not show any evidence of metastatic disease.  He is scheduled to undergo radical nephrectomy on August 26, 2019.  Clinically, he reports no complaints at this time.  He denies any hematuria which has resolved after few days, flank pain or weight loss.  Performance status is excellent without any decline in his energy or ability to work.  He does not report any headaches, blurry vision, syncope or seizures. Does not report any fevers, chills or sweats.  Does not report any cough, wheezing or hemoptysis.  Does not report any chest pain, palpitation, orthopnea or leg edema.  Does not report any nausea, vomiting or abdominal pain.  Does not report any constipation or diarrhea.  Does not report any skeletal complaints.    Does not report frequency, urgency or hematuria.  Does not report any skin rashes or lesions. Does not report any heat or cold intolerance.  Does not report any lymphadenopathy or petechiae.  Does not report any anxiety or depression.   Remaining review of systems is negative.    Past Medical History:  Diagnosis Date  . Allergy    no diagnosis per pt   . Hypertension   :  Past Surgical History:  Procedure Laterality Date  . surgery as a child     thinks matbe was a hernia repair as a premature baby   :   Current Outpatient Medications:  .  ELDERBERRY PO, Take 1 tablet by mouth daily., Disp: , Rfl:  .  labetalol (NORMODYNE) 100 MG tablet, Take 1 tablet (100 mg total) by mouth 2 (two) times daily., Disp: 180 tablet, Rfl: 3 .  lisinopril-hydrochlorothiazide (ZESTORETIC) 20-25 MG tablet, Take 1 tablet by mouth daily., Disp: 90 tablet, Rfl: 3 .  Multiple Vitamin (MULTIVITAMIN) LIQD, Take 5 mLs by mouth daily., Disp: , Rfl: :  Allergies  Allergen Reactions  . Codeine Other (See Comments)    Nose bleeds  :  Family History  Problem Relation Age of Onset  . Colon polyps Mother   . Colon cancer Neg Hx   . Esophageal cancer Neg Hx   . Prostate cancer Neg Hx   . Rectal cancer Neg Hx   :  Social History   Socioeconomic History  . Marital status: Single    Spouse name: Not on file  . Number of children: Not on file  . Years of education: Not on file  . Highest education level: Not on file  Occupational  History  . Not on file  Tobacco Use  . Smoking status: Never Smoker  . Smokeless tobacco: Never Used  Substance and Sexual Activity  . Alcohol use: Yes    Comment: occassionaly  . Drug use: Never  . Sexual activity: Not on file  Other Topics Concern  . Not on file  Social History Narrative  . Not on file   Social Determinants of Health   Financial Resource Strain:   . Difficulty of Paying Living Expenses: Not on file  Food Insecurity:   . Worried About Charity fundraiser in the Last Year: Not on file  . Ran Out of Food in the Last Year: Not on file  Transportation Needs:   . Lack of Transportation (Medical): Not on file  . Lack of Transportation (Non-Medical): Not on file  Physical Activity:    . Days of Exercise per Week: Not on file  . Minutes of Exercise per Session: Not on file  Stress:   . Feeling of Stress : Not on file  Social Connections:   . Frequency of Communication with Friends and Family: Not on file  . Frequency of Social Gatherings with Friends and Family: Not on file  . Attends Religious Services: Not on file  . Active Member of Clubs or Organizations: Not on file  . Attends Archivist Meetings: Not on file  . Marital Status: Not on file  Intimate Partner Violence:   . Fear of Current or Ex-Partner: Not on file  . Emotionally Abused: Not on file  . Physically Abused: Not on file  . Sexually Abused: Not on file  :    Exam: Blood pressure (!) 155/86, pulse 76, temperature 97.9 F (36.6 C), temperature source Temporal, resp. rate 18, height 5\' 10"  (1.778 m), weight 237 lb 9.6 oz (107.8 kg), SpO2 100 %.  ECOG 0  General appearance: alert and cooperative appeared without distress. Head: atraumatic without any abnormalities. Eyes: conjunctivae/corneas clear. PERRL.  Sclera anicteric. Throat: lips, mucosa, and tongue normal; without oral thrush or ulcers. Resp: clear to auscultation bilaterally without rhonchi, wheezes or dullness to percussion. Cardio: regular rate and rhythm, S1, S2 normal, no murmur, click, rub or gallop GI: soft, non-tender; bowel sounds normal; no masses,  no organomegaly Skin: Skin color, texture, turgor normal. No rashes or lesions Lymph nodes: Cervical, supraclavicular, and axillary nodes normal. Neurologic: Grossly normal without any motor, sensory or deep tendon reflexes. Musculoskeletal: No joint deformity or effusion.  CBC    Component Value Date/Time   WBC 8.4 03/16/2019 1715   WBC 8.2 11/14/2018 1137   RBC 4.41 03/16/2019 1715   RBC 5.34 11/14/2018 1137   HGB 11.4 (L) 03/16/2019 1715   HCT 35.4 (L) 03/16/2019 1715   PLT 381 03/16/2019 1715   MCV 80 03/16/2019 1715   MCH 25.9 (L) 03/16/2019 1715   MCH 26.8  11/14/2018 1137   MCHC 32.2 03/16/2019 1715   MCHC 31.9 11/14/2018 1137   RDW 13.8 03/16/2019 1715   LYMPHSABS 2.2 03/16/2019 1715   EOSABS 0.2 03/16/2019 1715   BASOSABS 0.1 03/16/2019 1715     Chemistry      Component Value Date/Time   NA 134 07/06/2019 1702   K 4.1 07/06/2019 1702   CL 97 07/06/2019 1702   CO2 22 07/06/2019 1702   BUN 21 07/06/2019 1702   CREATININE 1.96 (H) 07/06/2019 1702      Component Value Date/Time   CALCIUM 9.3 07/06/2019 1702   ALKPHOS  48 03/16/2019 1715   AST 12 03/16/2019 1715   ALT 10 03/16/2019 1715   BILITOT 0.5 03/16/2019 1715       DG Chest 2 View  Result Date: 08/14/2019 CLINICAL DATA:  Preoperative chest radiograph prior to nephrectomy. EXAM: CHEST - 2 VIEW COMPARISON:  11/14/2018 chest radiograph FINDINGS: The cardiomediastinal silhouette is unremarkable. There is no evidence of focal airspace disease, pulmonary edema, suspicious pulmonary nodule/mass, pleural effusion, or pneumothorax. No acute bony abnormalities are identified. IMPRESSION: No active cardiopulmonary disease. Electronically Signed   By: Margarette Canada M.D.   On: 08/14/2019 10:25   MR ABDOMEN W WO CONTRAST  Result Date: 08/13/2019 CLINICAL DATA:  LEFT renal mass EXAM: MRI ABDOMEN WITHOUT AND WITH CONTRAST TECHNIQUE: Multiplanar multisequence MR imaging of the abdomen was performed both before and after the administration of intravenous contrast. CONTRAST:  16mL GADAVIST GADOBUTROL 1 MMOL/ML IV SOLN COMPARISON:  CT 08/11/2019 FINDINGS: Lower chest:  Lung bases are clear. Hepatobiliary: Multiple round enhancing lesions within the liver parenchyma. Lesions have peripheral enhancement in low central intensity. Example 2.5 cm lesion LEFT hepatic lobe (image 24/12). 1.6 cm lesion in the inferior RIGHT hepatic lobe on image 51/12. Lesions also evident as T2 hyperintensity. Approximately 12 lesions in liver involving LEFT and RIGHT hepatic lobes. Gallbladder normal.  Biliary tree normal.  Pancreas: Normal pancreatic parenchymal intensity. No ductal dilatation or inflammation. Spleen: Normal spleen. Adrenals/urinary tract: Adrenal glands are normal. Large enhancing mass extending from the lower pole of the LEFT kidney measures 8.9 x 8.8 by 9.5 cm. The mass appears contained within the pararenal fascia. No extension into the LEFT renal vein. There is blood product within the LEFT collecting system and renal pelvis. There is enhancing tissue within the renal pelvis and proximal ureter. The proximal ureters expanded by this enhancing tissue to 16 mm on image 46/18. Collecting system tumor extension also seen on coronal image 19/3 (T2 weighted imaging). Potential small LEFT periaortic lymph node measuring 8 mm on image 62/12. RIGHT kidney normal. Bladder not imaged in entirety but appears normal. Stomach/Bowel: Stomach and limited of the small bowel is unremarkable Vascular/Lymphatic: Abdominal aortic normal caliber. No retroperitoneal periportal lymphadenopathy. Musculoskeletal: No aggressive osseous lesion IMPRESSION: 1. Large enhancing mass extending from the lower pole of the LEFT kidney consistent with renal cell carcinoma. Tumor mass appears contained within the pararenal fascia. No renal vein invasion identified. 2. Tumor extension into the LEFT renal pelvis and proximal LEFT ureter. 3. Multifocal hepatic metastasis involving both hepatic lobes (stage IV carcinoma). Electronically Signed   By: Suzy Bouchard M.D.   On: 08/13/2019 12:08    Assessment and Plan:   49 year old man with:  1.  Left kidney mass noted on imaging studies in February 2021.  He was found to have 8.9 x 8.8 x 9.5 cm tumor of the lower pole of the left kidney.  His MRI also showed hepatic lesions consistent with likely metastatic disease.  Chest x-ray did not show any evidence of involvement.  The differential diagnosis and the natural course of this disease was reviewed at this time and treatment options were  discussed.  Upfront radical nephrectomy would be reasonable at this time given the bulk of the tumor and the symptomatic hematuria.  Also this will help [place] to provide tissue diagnosis which will help dictate the form of systemic therapy he needs pending the histology.  Given his metastatic disease upfront systemic therapy would be reasonable but we will need and obtain a tissue biopsy  which would delay his treatment at this point.  Delaying his systemic treatment post nephrectomy could also be problematic I will try to expedite as much as possible given his high risk of developing further metastatic disease.  Systemic treatment options were reviewed at this time which include immunotherapy, oral targeted therapy or combination of the above.  If we are dealing with clear-cell histology combination of immunotherapy agents with nivolumab and ipilimumab versus combination of Pembrolizumab and axitinib versus combination of nivolumab and cabozantinib all present intriguing and effective options.  Given the need for high response rates I favor proceeding with oral targeted therapy combination with immunotherapy pending the results of his surgery and recovery.  He is agreeable with this plan.  2.  Hypertension: Managed by his primary care physician I will need to monitor closely if oral targeted therapy be considered.  3.  Follow-up: Will be shortly after his surgery to discuss systemic therapy.  60  minutes was dedicated to this encounter.  The time was spent on reviewing his disease status, reviewing imaging studies, treatment options and complication related to therapy.  . A copy of this consult has been forwarded to the requesting physician.

## 2019-08-19 ENCOUNTER — Telehealth: Payer: Self-pay | Admitting: Oncology

## 2019-08-19 NOTE — Telephone Encounter (Signed)
Scheduled appt per 2/16 los.  Spoke with pt and he is aware of the appt date and time,

## 2019-08-20 NOTE — Patient Instructions (Signed)
DUE TO COVID-19 ONLY ONE VISITOR IS ALLOWED TO COME WITH YOU AND STAY IN THE WAITING ROOM ONLY DURING PRE OP AND PROCEDURE DAY OF SURGERY. THE 1 VISITOR MAY VISIT WITH YOU AFTER SURGERY IN YOUR PRIVATE ROOM DURING VISITING HOURS ONLY!  YOU NEED TO HAVE A COVID 19 TEST ON_2/20/21______ @_10 :15______, THIS TEST MUST BE DONE BEFORE SURGERY, COME  [address] [place] , [id number].  ([place]) ONCE YOUR COVID TEST IS COMPLETED, PLEASE BEGIN THE QUARANTINE INSTRUCTIONS AS OUTLINED IN YOUR HANDOUT.                [name]    Your procedure is scheduled on: 08/26/19   Report to [facility] Main  Entrance   Report to admitting at  10:45 AM     Call this number if you have problems the morning of surgery [phone]    Remember: Do not eat food after Midnight.   BRUSH YOUR TEETH MORNING OF SURGERY AND RINSE YOUR MOUTH OUT, NO CHEWING GUM CANDY OR MINTS.   You may have clear liquid until 6:45 AM   CLEAR LIQUID DIET   Foods Allowed                                                                     Foods Excluded  Coffee and tea, regular and decaf                             liquids that you cannot  Plain Jell-O any favor except red or purple                                           see through such as: Fruit ices (not with fruit pulp)                                     milk, soups, orange juice  Iced Popsicles                                    All solid food Carbonated beverages, regular and diet                                    Cranberry, grape and apple juices Sports drinks like Gatorade Lightly seasoned clear broth or consume(fat free) Sugar, honey syrup     Take these medicines the morning of surgery with A SIP OF WATER: Labetalol  DO NOT TAKE ANY DIABETIC MEDICATIONS DAY OF YOUR SURGERY                               You may not have any metal on your body including               piercings  Do not wear jewelry,  lotions,  powders or  deodorant                      Men may shave face and neck.   Do not bring valuables to the hospital. [place].  Contacts, dentures or bridgework may not be worn into surgery.       Special Instructions: N/A              Please read over the following fact sheets you were given: _____________________________________________________________________             [facility] - Preparing for Surgery  Before surgery, you can play an important role.   Because skin is not sterile, your skin needs to be as free of germs as possible.   You can reduce the number of germs on your skin by washing with CHG (chlorahexidine gluconate) soap before surgery.   CHG is an antiseptic cleaner which kills germs and bonds with the skin to continue killing germs even after washing. Please DO NOT use if you have an allergy to CHG or antibacterial soaps  If your skin becomes reddened/irritated stop using the CHG and inform your nurse when you arrive at Short Stay. .  You may shave your face/neck.  Please follow these instructions carefully:  1.  Shower with CHG Soap the night before surgery and the  morning of Surgery.  2.  If you choose to wash your hair, wash your hair first as usual with your  normal  shampoo.  3.  After you shampoo, rinse your hair and body thoroughly to remove the  shampoo.                                        4.  Use CHG as you would any other liquid soap.  You can apply chg directly  to the skin and wash                       Gently with a scrungie or clean washcloth.  5.  Apply the CHG Soap to your body ONLY FROM THE NECK DOWN.   Do not use on face/ open                           Wound or open sores. Avoid contact with eyes, ears mouth and genitals (private parts).                       Wash face,  Genitals (private parts) with your normal soap.             6.  Wash thoroughly, paying special attention to the area where  your surgery  will be performed.  7.  Thoroughly rinse your body with warm water from the neck down.  8.  DO NOT shower/wash with your normal soap after using and rinsing off  the CHG Soap.             9.  Pat yourself dry with a clean towel.            10.  Wear clean pajamas.            11.  Place clean sheets  on your bed the night of your first shower and do not  sleep with pets. Day of Surgery : Do not apply any lotions/deodorants the morning of surgery.  Please wear clean clothes to the hospital/surgery center.  FAILURE TO FOLLOW THESE INSTRUCTIONS MAY RESULT IN THE CANCELLATION OF YOUR SURGERY PATIENT SIGNATURE_________________________________  NURSE SIGNATURE__________________________________  ________________________________________________________________________

## 2019-08-21 ENCOUNTER — Encounter (HOSPITAL_COMMUNITY)
Admission: RE | Admit: 2019-08-21 | Discharge: 2019-08-21 | Disposition: A | Discharge status: Home / Self Care | Payer: BC Managed Care – PPO | Source: Ambulatory Visit | Attending: Urology | Admitting: Urology

## 2019-08-21 ENCOUNTER — Other Ambulatory Visit: Payer: Self-pay

## 2019-08-21 ENCOUNTER — Encounter (HOSPITAL_COMMUNITY): Payer: Self-pay

## 2019-08-21 DIAGNOSIS — Z01812 Encounter for preprocedural laboratory examination: Secondary | ICD-10-CM | POA: Diagnosis not present

## 2019-08-21 LAB — CBC
HCT: 33 % — ABNORMAL LOW (ref 39.0–52.0)
Hemoglobin: 10.1 g/dL — ABNORMAL LOW (ref 13.0–17.0)
MCH: 25.3 pg — ABNORMAL LOW (ref 26.0–34.0)
MCHC: 30.6 g/dL (ref 30.0–36.0)
MCV: 82.5 fL (ref 80.0–100.0)
Platelets: 383 10*3/uL (ref 150–400)
RBC: 4 MIL/uL — ABNORMAL LOW (ref 4.22–5.81)
RDW: 15 % (ref 11.5–15.5)
WBC: 9.6 10*3/uL (ref 4.0–10.5)
nRBC: 0 % (ref 0.0–0.2)

## 2019-08-21 LAB — BASIC METABOLIC PANEL
Anion gap: 9 (ref 5–15)
BUN: 24 mg/dL — ABNORMAL HIGH (ref 6–20)
CO2: 20 mmol/L — ABNORMAL LOW (ref 22–32)
Calcium: 9.2 mg/dL (ref 8.9–10.3)
Chloride: 109 mmol/L (ref 98–111)
Creatinine, Ser: 1.63 mg/dL — ABNORMAL HIGH (ref 0.61–1.24)
GFR calc Af Amer: 56 mL/min — ABNORMAL LOW (ref >60)
GFR calc non Af Amer: 48 mL/min — ABNORMAL LOW (ref >60)
Glucose, Bld: 97 mg/dL (ref 70–99)
Potassium: 4.1 mmol/L (ref 3.5–5.1)
Sodium: 138 mmol/L (ref 135–145)

## 2019-08-21 LAB — URINALYSIS, COMPLETE (UACMP) WITH MICROSCOPIC
Bacteria, UA: NONE SEEN
Bilirubin Urine: NEGATIVE
Glucose, UA: NEGATIVE mg/dL
Ketones, ur: NEGATIVE mg/dL
Leukocytes,Ua: NEGATIVE
Nitrite: NEGATIVE
Protein, ur: NEGATIVE mg/dL
RBC / HPF: >50 RBC/hpf — ABNORMAL HIGH (ref 0–5)
Specific Gravity, Urine: 1.019 (ref 1.005–1.030)
pH: 5 (ref 5.0–8.0)

## 2019-08-21 NOTE — Progress Notes (Signed)
PCP - Dr. Malena Peer Cardiologist - none  Chest x-ray - 08/14/19 EKG - 11/14/18 Stress Test - no ECHO - no Cardiac Cath - no  Sleep Study - no CPAP -   Fasting Blood Sugar - NA Checks Blood Sugar _____ times a day  Blood Thinner Instructions:NA Aspirin Instructions: Last Dose:  Anesthesia review:   Patient denies shortness of breath, fever, cough and chest pain at PAT appointment yes Patient verbalized understanding of instructions that were given to them at the PAT appointment. Patient was also instructed that they will need to review over the PAT instructions again at home before surgery. yes

## 2019-08-22 ENCOUNTER — Other Ambulatory Visit (HOSPITAL_COMMUNITY)
Admission: RE | Admit: 2019-08-22 | Discharge: 2019-08-22 | Disposition: A | Discharge status: Home / Self Care | Payer: BC Managed Care – PPO | Source: Ambulatory Visit | Attending: Urology | Admitting: Urology

## 2019-08-22 DIAGNOSIS — Z20822 Contact with and (suspected) exposure to covid-19: Secondary | ICD-10-CM | POA: Diagnosis not present

## 2019-08-22 DIAGNOSIS — Z01812 Encounter for preprocedural laboratory examination: Secondary | ICD-10-CM | POA: Diagnosis not present

## 2019-08-22 LAB — ABO/RH: ABO/RH(D): A POS

## 2019-08-22 LAB — SARS CORONAVIRUS 2 (TAT 6-24 HRS): SARS Coronavirus 2: NEGATIVE

## 2019-08-26 ENCOUNTER — Other Ambulatory Visit: Payer: Self-pay

## 2019-08-26 ENCOUNTER — Observation Stay (HOSPITAL_COMMUNITY)
Admission: RE | Admit: 2019-08-26 | Discharge: 2019-08-27 | Disposition: A | Discharge status: Home / Self Care | Payer: BC Managed Care – PPO | Attending: Urology | Admitting: Urology

## 2019-08-26 ENCOUNTER — Ambulatory Visit (HOSPITAL_COMMUNITY): Payer: BC Managed Care – PPO

## 2019-08-26 ENCOUNTER — Encounter (HOSPITAL_COMMUNITY): Payer: Self-pay | Admitting: Urology

## 2019-08-26 ENCOUNTER — Ambulatory Visit (HOSPITAL_COMMUNITY): Payer: BC Managed Care – PPO | Admitting: Physician Assistant

## 2019-08-26 ENCOUNTER — Encounter (HOSPITAL_COMMUNITY)
Admission: RE | Disposition: A | Discharge status: Home / Self Care | Payer: Self-pay | Source: Home / Self Care | Attending: Urology

## 2019-08-26 DIAGNOSIS — Z79899 Other long term (current) drug therapy: Secondary | ICD-10-CM | POA: Diagnosis not present

## 2019-08-26 DIAGNOSIS — N2889 Other specified disorders of kidney and ureter: Secondary | ICD-10-CM | POA: Diagnosis not present

## 2019-08-26 DIAGNOSIS — C642 Malignant neoplasm of left kidney, except renal pelvis: Secondary | ICD-10-CM | POA: Diagnosis not present

## 2019-08-26 DIAGNOSIS — C649 Malignant neoplasm of unspecified kidney, except renal pelvis: Secondary | ICD-10-CM | POA: Diagnosis not present

## 2019-08-26 DIAGNOSIS — I129 Hypertensive chronic kidney disease with stage 1 through stage 4 chronic kidney disease, or unspecified chronic kidney disease: Secondary | ICD-10-CM | POA: Insufficient documentation

## 2019-08-26 DIAGNOSIS — Z885 Allergy status to narcotic agent status: Secondary | ICD-10-CM | POA: Insufficient documentation

## 2019-08-26 DIAGNOSIS — N183 Chronic kidney disease, stage 3 unspecified: Secondary | ICD-10-CM | POA: Insufficient documentation

## 2019-08-26 DIAGNOSIS — R319 Hematuria, unspecified: Secondary | ICD-10-CM | POA: Diagnosis not present

## 2019-08-26 DIAGNOSIS — D49512 Neoplasm of unspecified behavior of left kidney: Secondary | ICD-10-CM | POA: Diagnosis not present

## 2019-08-26 DIAGNOSIS — I10 Essential (primary) hypertension: Secondary | ICD-10-CM | POA: Diagnosis not present

## 2019-08-26 HISTORY — PX: ROBOT ASSISTED LAPAROSCOPIC NEPHRECTOMY: SHX5140

## 2019-08-26 HISTORY — PX: CYSTOSCOPY: SHX5120

## 2019-08-26 LAB — TYPE AND SCREEN
ABO/RH(D): A POS
Antibody Screen: NEGATIVE

## 2019-08-26 LAB — HEMOGLOBIN AND HEMATOCRIT, BLOOD
HCT: 32.3 % — ABNORMAL LOW (ref 39.0–52.0)
Hemoglobin: 9.9 g/dL — ABNORMAL LOW (ref 13.0–17.0)

## 2019-08-26 SURGERY — NEPHRECTOMY, RADICAL, ROBOT-ASSISTED, LAPAROSCOPIC, ADULT
Anesthesia: General

## 2019-08-26 MED ORDER — EPHEDRINE SULFATE-NACL 50-0.9 MG/10ML-% IV SOSY
PREFILLED_SYRINGE | INTRAVENOUS | Status: DC | PRN
  Administered 2019-08-26 (×3): 5 mg via INTRAVENOUS
  Administered 2019-08-26: 10 mg via INTRAVENOUS
  Administered 2019-08-26: 5 mg via INTRAVENOUS

## 2019-08-26 MED ORDER — FENTANYL CITRATE (PF) 250 MCG/5ML IJ SOLN
INTRAMUSCULAR | Status: DC | PRN
  Administered 2019-08-26: 50 ug via INTRAVENOUS
  Administered 2019-08-26: 100 ug via INTRAVENOUS
  Administered 2019-08-26 (×3): 50 ug via INTRAVENOUS

## 2019-08-26 MED ORDER — FENTANYL CITRATE (PF) 100 MCG/2ML IJ SOLN
25.0000 ug | INTRAMUSCULAR | Status: DC | PRN
  Administered 2019-08-26: 50 ug via INTRAVENOUS

## 2019-08-26 MED ORDER — PROMETHAZINE HCL 12.5 MG PO TABS: 12.5000 mg | ORAL_TABLET | ORAL | 0 refills | Status: DC | PRN

## 2019-08-26 MED ORDER — CEFAZOLIN SODIUM-DEXTROSE 2-4 GM/100ML-% IV SOLN
2.0000 g | Freq: Once | INTRAVENOUS | Status: AC
  Administered 2019-08-26: 13:00:00 2 g via INTRAVENOUS

## 2019-08-26 MED ORDER — SODIUM CHLORIDE (PF) 0.9 % IJ SOLN
INTRAMUSCULAR | Status: AC
  Filled 2019-08-26: qty 20

## 2019-08-26 MED ORDER — EPHEDRINE 5 MG/ML INJ
INTRAVENOUS | Status: AC
  Filled 2019-08-26: qty 10

## 2019-08-26 MED ORDER — LABETALOL HCL 5 MG/ML IV SOLN
INTRAVENOUS | Status: AC
  Filled 2019-08-26: qty 4

## 2019-08-26 MED ORDER — DEXAMETHASONE SODIUM PHOSPHATE 10 MG/ML IJ SOLN
INTRAMUSCULAR | Status: DC | PRN
  Administered 2019-08-26: 10 mg via INTRAVENOUS

## 2019-08-26 MED ORDER — LACTATED RINGERS IR SOLN
Status: DC | PRN
  Administered 2019-08-26: 1

## 2019-08-26 MED ORDER — FENTANYL CITRATE (PF) 100 MCG/2ML IJ SOLN: 25.0000 ug | INTRAMUSCULAR | Status: DC | PRN

## 2019-08-26 MED ORDER — ONDANSETRON HCL 4 MG/2ML IJ SOLN: 4.0000 mg | INTRAMUSCULAR | Status: DC | PRN

## 2019-08-26 MED ORDER — BACITRACIN-NEOMYCIN-POLYMYXIN 400-5-5000 EX OINT: 1.0000 "application " | TOPICAL_OINTMENT | Freq: Three times a day (TID) | CUTANEOUS | Status: DC | PRN

## 2019-08-26 MED ORDER — ACETAMINOPHEN 325 MG PO TABS: 650.0000 mg | ORAL_TABLET | ORAL | Status: DC | PRN

## 2019-08-26 MED ORDER — ACETAMINOPHEN 500 MG PO TABS
1000.0000 mg | ORAL_TABLET | Freq: Once | ORAL | Status: AC
  Administered 2019-08-26: 1000 mg via ORAL
  Filled 2019-08-26: qty 2

## 2019-08-26 MED ORDER — LABETALOL HCL 100 MG PO TABS
100.0000 mg | ORAL_TABLET | Freq: Two times a day (BID) | ORAL | Status: DC
  Administered 2019-08-26 – 2019-08-27 (×2): 100 mg via ORAL
  Filled 2019-08-26 (×3): qty 1

## 2019-08-26 MED ORDER — LIDOCAINE 2% (20 MG/ML) 5 ML SYRINGE
INTRAMUSCULAR | Status: DC | PRN
  Administered 2019-08-26: 100 mg via INTRAVENOUS

## 2019-08-26 MED ORDER — PROPOFOL 10 MG/ML IV BOLUS
INTRAVENOUS | Status: DC | PRN
  Administered 2019-08-26: 150 mg via INTRAVENOUS
  Administered 2019-08-26: 50 mg via INTRAVENOUS

## 2019-08-26 MED ORDER — MIDAZOLAM HCL 2 MG/2ML IJ SOLN
INTRAMUSCULAR | Status: AC
  Filled 2019-08-26: qty 2

## 2019-08-26 MED ORDER — TRAMADOL HCL 50 MG PO TABS: 50.0000 mg | ORAL_TABLET | Freq: Four times a day (QID) | ORAL | 0 refills | Status: DC | PRN

## 2019-08-26 MED ORDER — FENTANYL CITRATE (PF) 100 MCG/2ML IJ SOLN
INTRAMUSCULAR | Status: AC
  Administered 2019-08-26: 50 ug via INTRAVENOUS
  Filled 2019-08-26: qty 2

## 2019-08-26 MED ORDER — BUPIVACAINE LIPOSOME 1.3 % IJ SUSP
20.0000 mL | Freq: Once | INTRAMUSCULAR | Status: AC
  Administered 2019-08-26: 20 mL
  Filled 2019-08-26: qty 20

## 2019-08-26 MED ORDER — PHENYLEPHRINE HCL (PRESSORS) 10 MG/ML IV SOLN
INTRAVENOUS | Status: DC | PRN
  Administered 2019-08-26 (×2): 80 ug via INTRAVENOUS
  Administered 2019-08-26: 120 ug via INTRAVENOUS

## 2019-08-26 MED ORDER — DOCUSATE SODIUM 100 MG PO CAPS: 100.0000 mg | ORAL_CAPSULE | Freq: Two times a day (BID) | ORAL | Status: DC

## 2019-08-26 MED ORDER — SUGAMMADEX SODIUM 500 MG/5ML IV SOLN
INTRAVENOUS | Status: DC | PRN
  Administered 2019-08-26: 300 mg via INTRAVENOUS

## 2019-08-26 MED ORDER — ONDANSETRON HCL 4 MG/2ML IJ SOLN
INTRAMUSCULAR | Status: DC | PRN
  Administered 2019-08-26: 4 mg via INTRAVENOUS

## 2019-08-26 MED ORDER — OXYBUTYNIN CHLORIDE 5 MG PO TABS: 5.0000 mg | ORAL_TABLET | Freq: Three times a day (TID) | ORAL | Status: DC | PRN

## 2019-08-26 MED ORDER — LACTATED RINGERS IV SOLN: INTRAVENOUS | Status: DC

## 2019-08-26 MED ORDER — CEFAZOLIN SODIUM-DEXTROSE 1-4 GM/50ML-% IV SOLN
1.0000 g | Freq: Three times a day (TID) | INTRAVENOUS | Status: AC
  Administered 2019-08-26 – 2019-08-27 (×2): 1 g via INTRAVENOUS
  Filled 2019-08-26 (×2): qty 50

## 2019-08-26 MED ORDER — DIPHENHYDRAMINE HCL 12.5 MG/5ML PO ELIX: 12.5000 mg | ORAL_SOLUTION | Freq: Four times a day (QID) | ORAL | Status: DC | PRN

## 2019-08-26 MED ORDER — SUGAMMADEX SODIUM 200 MG/2ML IV SOLN: INTRAVENOUS | Status: DC | PRN

## 2019-08-26 MED ORDER — ROCURONIUM BROMIDE 10 MG/ML (PF) SYRINGE
PREFILLED_SYRINGE | INTRAVENOUS | Status: DC | PRN
  Administered 2019-08-26: 20 mg via INTRAVENOUS
  Administered 2019-08-26: 30 mg via INTRAVENOUS
  Administered 2019-08-26: 100 mg via INTRAVENOUS

## 2019-08-26 MED ORDER — DEXTROSE-NACL 5-0.45 % IV SOLN
INTRAVENOUS | Status: DC
  Administered 2019-08-26: 1000 mL via INTRAVENOUS

## 2019-08-26 MED ORDER — DEXAMETHASONE SODIUM PHOSPHATE 10 MG/ML IJ SOLN
INTRAMUSCULAR | Status: AC
  Filled 2019-08-26: qty 1

## 2019-08-26 MED ORDER — LIDOCAINE 2% (20 MG/ML) 5 ML SYRINGE
INTRAMUSCULAR | Status: AC
  Filled 2019-08-26: qty 5

## 2019-08-26 MED ORDER — OXYCODONE HCL 5 MG PO TABS
5.0000 mg | ORAL_TABLET | ORAL | Status: DC | PRN
  Administered 2019-08-26: 5 mg via ORAL
  Filled 2019-08-26: qty 1

## 2019-08-26 MED ORDER — FENTANYL CITRATE (PF) 250 MCG/5ML IJ SOLN
INTRAMUSCULAR | Status: AC
  Filled 2019-08-26: qty 5

## 2019-08-26 MED ORDER — ROCURONIUM BROMIDE 10 MG/ML (PF) SYRINGE
PREFILLED_SYRINGE | INTRAVENOUS | Status: AC
  Filled 2019-08-26: qty 10

## 2019-08-26 MED ORDER — PROPOFOL 10 MG/ML IV BOLUS
INTRAVENOUS | Status: AC
  Filled 2019-08-26: qty 20

## 2019-08-26 MED ORDER — SODIUM CHLORIDE (PF) 0.9 % IJ SOLN
INTRAMUSCULAR | Status: DC | PRN
  Administered 2019-08-26: 20 mL

## 2019-08-26 MED ORDER — LACTATED RINGERS IV SOLN: INTRAVENOUS | Status: DC | PRN

## 2019-08-26 MED ORDER — ONDANSETRON HCL 4 MG/2ML IJ SOLN
INTRAMUSCULAR | Status: AC
  Filled 2019-08-26: qty 2

## 2019-08-26 MED ORDER — STERILE WATER FOR IRRIGATION IR SOLN
Status: DC | PRN
  Administered 2019-08-26: 1000 mL

## 2019-08-26 MED ORDER — MIDAZOLAM HCL 5 MG/5ML IJ SOLN
INTRAMUSCULAR | Status: DC | PRN
  Administered 2019-08-26: 2 mg via INTRAVENOUS

## 2019-08-26 MED ORDER — DOCUSATE SODIUM 100 MG PO CAPS
100.0000 mg | ORAL_CAPSULE | Freq: Two times a day (BID) | ORAL | Status: DC
  Administered 2019-08-26 – 2019-08-27 (×2): 100 mg via ORAL
  Filled 2019-08-26 (×2): qty 1

## 2019-08-26 MED ORDER — FENTANYL CITRATE (PF) 100 MCG/2ML IJ SOLN
INTRAMUSCULAR | Status: AC
  Filled 2019-08-26: qty 2

## 2019-08-26 MED ORDER — DIPHENHYDRAMINE HCL 50 MG/ML IJ SOLN: 12.5000 mg | Freq: Four times a day (QID) | INTRAMUSCULAR | Status: DC | PRN

## 2019-08-26 SURGICAL SUPPLY — 62 items
BAG LAPAROSCOPIC 12 15 PORT 16 (BASKET) ×2 IMPLANT
BAG RETRIEVAL 12/15 (BASKET) ×3
BAG RETRIEVAL 12/15MM (BASKET) ×1
CHLORAPREP W/TINT 26 (MISCELLANEOUS) ×4 IMPLANT
CLIP VESOLOCK LG 6/CT PURPLE (CLIP) ×10 IMPLANT
CLIP VESOLOCK MED LG 6/CT (CLIP) ×4 IMPLANT
CLIP VESOLOCK XL 6/CT (CLIP) ×4 IMPLANT
CLOTH BEACON ORANGE TIMEOUT ST (SAFETY) ×4 IMPLANT
CNTNR URN SCR LID CUP LEK RST (MISCELLANEOUS) IMPLANT
CONT SPEC 4OZ STRL OR WHT (MISCELLANEOUS) ×2
COVER SURGICAL LIGHT HANDLE (MISCELLANEOUS) ×4 IMPLANT
COVER TIP SHEARS 8 DVNC (MISCELLANEOUS) ×2 IMPLANT
COVER TIP SHEARS 8MM DA VINCI (MISCELLANEOUS) ×2
COVER WAND RF STERILE (DRAPES) IMPLANT
CUTTER ECHEON FLEX ENDO 45 340 (ENDOMECHANICALS) ×2 IMPLANT
DECANTER SPIKE VIAL GLASS SM (MISCELLANEOUS) ×4 IMPLANT
DERMABOND ADVANCED (GAUZE/BANDAGES/DRESSINGS) ×2
DERMABOND ADVANCED .7 DNX12 (GAUZE/BANDAGES/DRESSINGS) ×2 IMPLANT
DRAPE ARM DVNC X/XI (DISPOSABLE) ×8 IMPLANT
DRAPE COLUMN DVNC XI (DISPOSABLE) ×2 IMPLANT
DRAPE DA VINCI XI ARM (DISPOSABLE) ×8
DRAPE DA VINCI XI COLUMN (DISPOSABLE) ×2
DRAPE INCISE IOBAN 66X45 STRL (DRAPES) ×4 IMPLANT
DRAPE SHEET LG 3/4 BI-LAMINATE (DRAPES) ×4 IMPLANT
ELECT REM PT RETURN 15FT ADLT (MISCELLANEOUS) ×4 IMPLANT
GAUZE SPONGE 4X4 12PLY STRL (GAUZE/BANDAGES/DRESSINGS) ×6 IMPLANT
GLOVE BIO SURGEON STRL SZ 6.5 (GLOVE) ×3 IMPLANT
GLOVE BIO SURGEONS STRL SZ 6.5 (GLOVE) ×1
GLOVE BIOGEL M STRL SZ7.5 (GLOVE) ×8 IMPLANT
GOWN STRL REUS W/TWL LRG LVL3 (GOWN DISPOSABLE) ×4 IMPLANT
GOWN STRL REUS W/TWL XL LVL3 (GOWN DISPOSABLE) ×8 IMPLANT
HEMOSTAT SURGICEL 4X8 (HEMOSTASIS) ×2 IMPLANT
IRRIG SUCT STRYKERFLOW 2 WTIP (MISCELLANEOUS) ×4
IRRIGATION SUCT STRKRFLW 2 WTP (MISCELLANEOUS) ×2 IMPLANT
KIT BASIN OR (CUSTOM PROCEDURE TRAY) ×4 IMPLANT
KIT TURNOVER KIT A (KITS) IMPLANT
NDL INSUFFLATION 14GA 120MM (NEEDLE) ×2 IMPLANT
NEEDLE INSUFFLATION 14GA 120MM (NEEDLE) ×4 IMPLANT
NS IRRIG 1000ML POUR BTL (IV SOLUTION) ×4 IMPLANT
PENCIL SMOKE EVACUATOR (MISCELLANEOUS) IMPLANT
PROTECTOR NERVE ULNAR (MISCELLANEOUS) ×8 IMPLANT
RELOAD STAPLE 45 2.6 WHT THIN (STAPLE) IMPLANT
SEAL CANN UNIV 5-8 DVNC XI (MISCELLANEOUS) ×6 IMPLANT
SEAL XI 5MM-8MM UNIVERSAL (MISCELLANEOUS) ×8
SET IRRIG Y TYPE TUR BLADDER L (SET/KITS/TRAYS/PACK) ×6 IMPLANT
SET TUBE SMOKE EVAC HIGH FLOW (TUBING) ×4 IMPLANT
SOL PREP PROV IODINE SCRUB 4OZ (MISCELLANEOUS) ×4 IMPLANT
SOLUTION ELECTROLUBE (MISCELLANEOUS) ×4 IMPLANT
STAPLE RELOAD 45 WHT (STAPLE) ×2 IMPLANT
STAPLE RELOAD 45MM WHITE (STAPLE) ×2
SUT MNCRL AB 4-0 PS2 18 (SUTURE) ×8 IMPLANT
SUT PDS AB 0 CT1 36 (SUTURE) ×8 IMPLANT
SUT VIC AB 2-0 SH 27 (SUTURE) ×2
SUT VIC AB 2-0 SH 27X BRD (SUTURE) IMPLANT
SUT VICRYL 0 UR6 27IN ABS (SUTURE) ×4 IMPLANT
TOWEL OR 17X26 10 PK STRL BLUE (TOWEL DISPOSABLE) ×4 IMPLANT
TOWEL OR NON WOVEN STRL DISP B (DISPOSABLE) ×4 IMPLANT
TRAY FOLEY MTR SLVR 14FR STAT (SET/KITS/TRAYS/PACK) ×4 IMPLANT
TRAY LAPAROSCOPIC (CUSTOM PROCEDURE TRAY) ×4 IMPLANT
TROCAR BLADELESS OPT 5 100 (ENDOMECHANICALS) ×2 IMPLANT
TROCAR XCEL 12X100 BLDLESS (ENDOMECHANICALS) ×4 IMPLANT
WATER STERILE IRR 1000ML POUR (IV SOLUTION) ×4 IMPLANT

## 2019-08-26 NOTE — Anesthesia Postprocedure Evaluation (Signed)
Anesthesia Post Note  Patient: Marcus Callahan  Procedure(s) Performed: XI ROBOTIC ASSISTED LAPAROSCOPIC NEPHRECTOMY (Left ) CYSTOSCOPY FLEXIBLE (N/A )     Patient location during evaluation: PACU Anesthesia Type: General Level of consciousness: awake and alert Pain management: pain level controlled Vital Signs Assessment: post-procedure vital signs reviewed and stable Respiratory status: spontaneous breathing, nonlabored ventilation, respiratory function stable and patient connected to nasal cannula oxygen Cardiovascular status: blood pressure returned to baseline and stable Postop Assessment: no apparent nausea or vomiting Anesthetic complications: no    Last Vitals:  Vitals:   08/26/19 1615 08/26/19 1623  BP: (!) 148/88   Pulse: 78   Resp: 13   Temp: 37 C   SpO2: 100% 100%    Last Pain:  Vitals:   08/26/19 1615  TempSrc:   PainSc: 4                  Feven Alderfer L Tarquin Welcher

## 2019-08-26 NOTE — Transfer of Care (Signed)
Immediate Anesthesia Transfer of Care Note  Patient: Marcus Callahan  Procedure(s) Performed: XI ROBOTIC ASSISTED LAPAROSCOPIC NEPHRECTOMY (Left ) CYSTOSCOPY FLEXIBLE (N/A )  Patient Location: PACU  Anesthesia Type:General  Level of Consciousness: awake, sedated, patient cooperative and responds to stimulation  Airway & Oxygen Therapy: Patient Spontanous Breathing and Patient connected to face mask oxygen  Post-op Assessment: Report given to RN and Post -op Vital signs reviewed and stable  Post vital signs: Reviewed and stable  Last Vitals:  Vitals Value Taken Time  BP 88/71 08/26/19 1539  Temp    Pulse 75 08/26/19 1541  Resp 12 08/26/19 1541  SpO2 100 % 08/26/19 1541  Vitals shown include unvalidated device data.  Last Pain:  Vitals:   08/26/19 1043  TempSrc: Oral         Complications: No apparent anesthesia complications

## 2019-08-26 NOTE — H&P (Signed)
PRE-OP H&P  Office Visit Report     08/17/2019   --------------------------------------------------------------------------------   Marcus Callahan  MRN: [id number]  DOB: 1970/12/20, 49 year old Male   PRIMARY CARE:  Hiram Comber, MD  PROVIDER:  Ellison Hughs, M.D.  LOCATION:  [facility] [phone]     --------------------------------------------------------------------------------   CC/HPI: CC: left renal mass   HPI: [name] is a 49 year old male who was initially seen for gross hematuria and was found to have a 9.5 cm solid and enhancing left renal mass along with multiple liver lesions seen on CT on 08/11/19 and MRI from 08/14/19. CXR shows no pulmonary nodules and the MRI confirmed no renal vein involvement. The patient is here today to discuss his upcoming left radical nephrectomy. Today, he denies flank pain, dysuria or interval episodes of gross hematuria.     ALLERGIES: Codeine    MEDICATIONS: Labetalol Hcl 100 mg tablet  Lisinopril-Hydrochlorothiazide 20 mg-25 mg tablet     GU PSH: No GU PSH    NON-GU PSH: No Non-GU PSH    GU PMH: Chronic kidney disease stage 3 (GFR 30-60) - 07/30/2019 Gross hematuria - 07/30/2019    NON-GU PMH: Hypertension    FAMILY HISTORY: Kidney Failure - Mother Prostate Cancer - Father   SOCIAL HISTORY: Marital Status: Single Race: Black or African American Current Smoking Status: Patient has never smoked.   Tobacco Use Assessment Completed: Used Tobacco in last 30 days? Drinks 1 drink per day.  Drinks 1 caffeinated drink per day.    REVIEW OF SYSTEMS:    GU Review Male:   Patient denies frequent urination, hard to postpone urination, burning/ pain with urination, get up at night to urinate, leakage of urine, stream starts and stops, trouble starting your stream, have to strain to urinate , erection problems, and penile pain.  Gastrointestinal (Upper):   Patient denies nausea, vomiting, and  indigestion/ heartburn.  Gastrointestinal (Lower):   Patient denies diarrhea and constipation.  Constitutional:   Patient denies fever, night sweats, weight loss, and fatigue.  Skin:   Patient denies skin rash/ lesion and itching.  Eyes:   Patient denies blurred vision and double vision.  Ears/ Nose/ Throat:   Patient denies sore throat and sinus problems.  Hematologic/Lymphatic:   Patient denies swollen glands and easy bruising.  Cardiovascular:   Patient denies chest pains and leg swelling.  Respiratory:   Patient denies cough and shortness of breath.  Endocrine:   Patient denies excessive thirst.  Musculoskeletal:   Patient denies back pain and joint pain.  Neurological:   Patient denies headaches and dizziness.  Psychologic:   Patient denies depression and anxiety.   VITAL SIGNS:      08/17/2019 01:43 PM  Weight 235 lb / 106.59 kg  Height 70 in / 177.8 cm  BP 162/97 mmHg  Heart Rate 82 /min  Temperature 97.1 F / 36.1 C  BMI 33.7 kg/m   MULTI-SYSTEM PHYSICAL EXAMINATION:    Constitutional: Well-nourished. No physical deformities. Normally developed. Good grooming.  Neurologic / Psychiatric: Oriented to time, oriented to place, oriented to person. No depression, no anxiety, no agitation.  Musculoskeletal: Normal gait and station of head and neck.     PAST DATA REVIEWED:  Source Of History:  Patient  X-Ray Review: MRI Abdomen: Reviewed Films. Reviewed Report. Discussed With Patient.     07/06/19  PSA  Total PSA 1.2 ng/dl   Notes:  CLINICAL DATA: LEFT renal mass     EXAM:  MRI ABDOMEN WITHOUT AND WITH CONTRAST     TECHNIQUE:  Multiplanar multisequence MR imaging of the abdomen was performed  both before and after the administration of intravenous contrast.     CONTRAST: 55mL GADAVIST GADOBUTROL 1 MMOL/ML IV SOLN     COMPARISON: CT 08/11/2019     FINDINGS:  Lower chest: Lung bases are clear.     Hepatobiliary: Multiple round enhancing lesions  within the liver  parenchyma. Lesions have peripheral enhancement in low central  intensity. Example 2.5 cm lesion LEFT hepatic lobe (image 24/12).  1.6 cm lesion in the inferior RIGHT hepatic lobe on image 51/12.  Lesions also evident as T2 hyperintensity. Approximately 12 lesions  in liver involving LEFT and RIGHT hepatic lobes.     Gallbladder normal. Biliary tree normal.     Pancreas: Normal pancreatic parenchymal intensity. No ductal  dilatation or inflammation.     Spleen: Normal spleen.     Adrenals/urinary tract: Adrenal glands are normal.     Large enhancing mass extending from the lower pole of the LEFT  kidney measures 8.9 x 8.8 by 9.5 cm. The mass appears contained  within the pararenal fascia. No extension into the LEFT renal vein.     There is blood product within the LEFT collecting system and renal  pelvis. There is enhancing tissue within the renal pelvis and  proximal ureter. The proximal ureters expanded by this enhancing  tissue to 16 mm on image 46/18. Collecting system tumor extension  also seen on coronal image 19/3 (T2 weighted imaging).     Potential small LEFT periaortic lymph node measuring 8 mm on image  62/12.     RIGHT kidney normal. Bladder not imaged in entirety but appears  normal.     Stomach/Bowel: Stomach and limited of the small bowel is  unremarkable     Vascular/Lymphatic: Abdominal aortic normal caliber. No  retroperitoneal periportal lymphadenopathy.     Musculoskeletal: No aggressive osseous lesion     IMPRESSION:  1. Large enhancing mass extending from the lower pole of the LEFT  kidney consistent with renal cell carcinoma. Tumor mass appears  contained within the pararenal fascia. No renal vein invasion  identified.  2. Tumor extension into the LEFT renal pelvis and proximal LEFT  ureter.  3. Multifocal hepatic metastasis involving both hepatic lobes (stage  IV carcinoma).        Electronically Signed  By: Suzy Bouchard  M.D.  On: 08/13/2019 12:08  __________________________________________________________________________________________   CLINICAL DATA: Preoperative chest radiograph prior to nephrectomy.     EXAM:  CHEST - 2 VIEW     COMPARISON: 11/14/2018 chest radiograph     FINDINGS:  The cardiomediastinal silhouette is unremarkable.     There is no evidence of focal airspace disease, pulmonary edema,  suspicious pulmonary nodule/mass, pleural effusion, or pneumothorax.     No acute bony abnormalities are identified.     IMPRESSION:  No active cardiopulmonary disease.        Electronically Signed  By: Margarette Canada M.D.  On: 08/14/2019 10:25   PROCEDURES:          Urinalysis w/Scope Dipstick Dipstick Cont'd Micro  Color: Yellow Bilirubin: Neg mg/dL WBC/hpf: 0 - 5/hpf  Appearance: Slightly Cloudy Ketones: Trace mg/dL RBC/hpf: 10 - 20/hpf  Specific Gravity: 1.030 Blood: 3+ ery/uL Bacteria: Rare (0-9/hpf)  pH: 5.5 Protein: 1+ mg/dL Cystals: NS (Not Seen)  Glucose: Neg mg/dL Urobilinogen: 0.2  mg/dL Casts: NS (Not Seen)    Nitrites: Neg Trichomonas: Not Present    Leukocyte Esterase: Neg leu/uL Mucous: Present      Epithelial Cells: 0 - 5/hpf      Yeast: NS (Not Seen)      Sperm: Not Present    Notes: Microscopic not concentrated.    ASSESSMENT:      ICD-10 Details  1 GU:   Left renal neoplasm - D49.512 Undiagnosed New Problem  2 NON-GU:   Secondary malignant neoplasm liver - C78.7 Undiagnosed New Problem   PLAN:           Schedule Return Visit/Planned Activity: Keep Scheduled Appointment          Document Letter(s):  Created for Patient: Clinical Summary   Created for Hiram Comber, MD         Notes:   -I reviewed imaging results and films with the patient personally. We discussed that the mass in question has features concerning for malignancy. I explained the natural history of presumed renal cell carcinoma. I reviewed the AUA guidelines for evaluation and treatment  of renal masses. The options of active surveillance, in situ tumor ablation, partial and radical nephrectomy was discussed. The risks of robot assisted laparoscopic, possible open LEFT radical nephrectomy and flexible cystoscopy were discussed in detail including but not limited to: negative pathology, open conversion, infection of the skin/abdominal cavity, VTE, MI/CVA, lymphatic leak, injury to adjacent solid/hollow viscus organs, bleeding requiring a blood transfusion, catastrophic bleeding, hernia formation and other imponderables. The patient voices understanding and wishes to proceed.

## 2019-08-26 NOTE — Op Note (Signed)
Operative Note  Preoperative diagnosis:  1.  9.5 cm left renal mass  Postoperative diagnosis: 1.  9.5 cm left renal mass  Procedure(s): 1.  Robot-assisted laparoscopic left radical nephrectomy 2.  Flexible cystoscopy   Surgeon: Ellison Hughs, MD  Assistants:  Debbrah Alar, [facility]  An assistant was required for this surgical procedure.  The duties of the assistant included but were not limited to suctioning, passing suture, camera manipulation, retraction.  This procedure would not be able to be performed without an Environmental consultant.   Arty Baumgartner, MD PGY4  Anesthesia:  General  Complications:  None  EBL:  50 mL   Specimens: 1.  Left renal mass  Drains/Catheters: 1.  Foley catheter  Intraoperative findings:   1. Cystoscopy was negative for any intravesical masses or lesions 2. Large multicystic left renal mass with aberrant vasculature arising from the distal aorta and proximal left iliac artery  Indication:  Marcus Callahan is a 49 y.o. male with a recent episode of gross hematuria, which prompted a CT urogram that showed a solid and enhancing 9.5 cm left renal mass with features concerning for renal cell carcinoma.  He has been consented for the above procedures, voices understanding wishes to proceed.  Description of procedure:  After informed consent was obtained, the patient was brought to the operating room and general endotracheal anesthesia was administered.  The patient was placed in the supine position and his genitalia were prepped and draped in the usual sterile fashion.  A timeout was performed.  A 16 French flexible cystoscope was then advanced into the urethra meatus and up into the bladder, under direct vision.  A complete bladder survey revealed no intravesical pathology.   The patient was then placed in the right lateral decubitus position and prepped and draped in usual sterile fashion.  A timeout was performed.  An 8 mm incision was then made lateral to  the left rectus muscle at the level of the left 12th rib.  Abdominal access was obtained via a Veress needle.  The abdominal cavity was then insufflated up to 15 mmHg.  An 8 mm port was then introduced into the abdominal cavity.  Inspection of the port entry site by the robotic camera revealed no adjacent organ injury.  We then placed 2 additional 8 mm robotic ports to triangulate the left renal hilum.  A 12 mm assistant port was then placed between the carmera port and 3rd robotic arm.  The white line of Toldt along the descending colon was incised sharply and the colon, along with its mesocolonic fat, was reflected medially until the aorta was identified.  We then made a small window adjacent to the lower pole of the left kidney, identifying the left psoas muscle, left ureter and left gonadal vein.  The left ureter and gonadal vein were then reflected anteriorly allowing [place] to then incised the perihilar attachments using electrocautery.  We encountered numerous aberrant vascular bundles arising from the distal aorta and proximal left iliac artery, that were systematically clipped and cut.  The remaining peri-renal attachments were then excised using accommodation of blunt dissection and electrocautery.  The left adrenal gland was spared.  The endovascular stapler was then used to ligate the left gonadal vein and left ureter.  Once the kidney was freely mobile, it was placed in Endo Catch bag to be be retrieved at the conclusion of the case.  The robot was then de-docked.  A left lower quadrant Gibson incision was then made and the  mass was removed within the Endo Catch bag.  The fascia within the midline assistant port was then closed using an interrupted 0 Vicryl suture.  The fascia of the internal and external oblique was then closed using a 0 PDS suture in a running fashion.  The subcutaneous tissue within the [facility] incision was then closed using a running 0 Vicryl suture.  All skin incisions were then  closed using 4-0 Monocryl and then dressed with Dermabond.  The patient tolerated the procedure well and was transferred to the postanesthesia in stable condition.    Plan: Monitor on the floor overnight

## 2019-08-26 NOTE — Plan of Care (Signed)

## 2019-08-26 NOTE — Anesthesia Procedure Notes (Signed)
Procedure Name: Intubation Date/Time: 08/26/2019 12:39 PM Performed by: Lollie Sails, CRNA Pre-anesthesia Checklist: Patient identified, Emergency Drugs available, Suction available, Patient being monitored and Timeout performed Patient Re-evaluated:Patient Re-evaluated prior to induction Oxygen Delivery Method: Circle system utilized Preoxygenation: Pre-oxygenation with 100% oxygen Induction Type: IV induction Ventilation: Two handed mask ventilation required Laryngoscope Size: Mac and 4 Grade View: Grade I Tube type: Oral Tube size: 7.5 mm Number of attempts: 1 Airway Equipment and Method: Stylet Placement Confirmation: ETT inserted through vocal cords under direct vision,  positive ETCO2 and breath sounds checked- equal and bilateral Secured at: 20 cm Tube secured with: Tape Dental Injury: Injury to lip  Comments: <1cm tear of upper lip- [place] performed

## 2019-08-26 NOTE — Plan of Care (Signed)
  Problem: Education: Goal: Knowledge of General Education information will improve Description: Including pain rating scale, medication(s)/side effects and non-pharmacologic comfort measures 08/26/2019 1953 by Hubert Azure, RN Outcome: Progressing 08/26/2019 1952 by Hubert Azure, RN Outcome: Progressing   Problem: Health Behavior/Discharge Planning: Goal: Ability to manage health-related needs will improve 08/26/2019 1953 by Hubert Azure, RN Outcome: Progressing 08/26/2019 1952 by Hubert Azure, RN Outcome: Progressing   Problem: Clinical Measurements: Goal: Ability to maintain clinical measurements within normal limits will improve 08/26/2019 1953 by Hubert Azure, RN Outcome: Progressing 08/26/2019 1952 by Hubert Azure, RN Outcome: Progressing Goal: Will remain free from infection 08/26/2019 1953 by Hubert Azure, RN Outcome: Progressing 08/26/2019 1952 by Hubert Azure, RN Outcome: Progressing Goal: Diagnostic test results will improve 08/26/2019 1953 by Hubert Azure, RN Outcome: Progressing 08/26/2019 1952 by Hubert Azure, RN Outcome: Progressing

## 2019-08-26 NOTE — Anesthesia Preprocedure Evaluation (Addendum)
Anesthesia Evaluation  Patient identified by MRN, date of birth, ID band Patient awake    Reviewed: Allergy & Precautions, NPO status , Patient's Chart, lab work & pertinent test results, reviewed documented beta blocker date and time   Airway Mallampati: I  TM Distance: >3 FB Neck ROM: Full    Dental no notable dental hx. (+) Teeth Intact, Dental Advisory Given   Pulmonary neg pulmonary ROS,    Pulmonary exam normal breath sounds clear to auscultation       Cardiovascular hypertension, Pt. on home beta blockers and Pt. on medications negative cardio ROS Normal cardiovascular exam Rhythm:Regular Rate:Normal     Neuro/Psych negative neurological ROS  negative psych ROS   GI/Hepatic negative GI ROS, Neg liver ROS,   Endo/Other  negative endocrine ROS  Renal/GU negative Renal ROS  negative genitourinary   Musculoskeletal negative musculoskeletal ROS (+)   Abdominal   Peds  Hematology negative hematology ROS (+)   Anesthesia Other Findings   Reproductive/Obstetrics                            Anesthesia Physical Anesthesia Plan  ASA: II  Anesthesia Plan: General   Post-op Pain Management:    Induction: Intravenous  PONV Risk Score and Plan: 2 and Midazolam, Dexamethasone and Ondansetron  Airway Management Planned: Oral ETT  Additional Equipment:   Intra-op Plan:   Post-operative Plan: Extubation in OR  Informed Consent: I have reviewed the patients History and Physical, chart, labs and discussed the procedure including the risks, benefits and alternatives for the proposed anesthesia with the patient or authorized representative who has indicated his/her understanding and acceptance.     Dental advisory given  Plan Discussed with: CRNA  Anesthesia Plan Comments:         Anesthesia Quick Evaluation

## 2019-08-26 NOTE — Discharge Instructions (Signed)

## 2019-08-27 DIAGNOSIS — N183 Chronic kidney disease, stage 3 unspecified: Secondary | ICD-10-CM | POA: Diagnosis not present

## 2019-08-27 DIAGNOSIS — C649 Malignant neoplasm of unspecified kidney, except renal pelvis: Secondary | ICD-10-CM | POA: Diagnosis not present

## 2019-08-27 DIAGNOSIS — Z79899 Other long term (current) drug therapy: Secondary | ICD-10-CM | POA: Diagnosis not present

## 2019-08-27 DIAGNOSIS — N2889 Other specified disorders of kidney and ureter: Secondary | ICD-10-CM | POA: Diagnosis not present

## 2019-08-27 DIAGNOSIS — I129 Hypertensive chronic kidney disease with stage 1 through stage 4 chronic kidney disease, or unspecified chronic kidney disease: Secondary | ICD-10-CM | POA: Diagnosis not present

## 2019-08-27 DIAGNOSIS — Z885 Allergy status to narcotic agent status: Secondary | ICD-10-CM | POA: Diagnosis not present

## 2019-08-27 LAB — BASIC METABOLIC PANEL
Anion gap: 10 (ref 5–15)
BUN: 22 mg/dL — ABNORMAL HIGH (ref 6–20)
CO2: 21 mmol/L — ABNORMAL LOW (ref 22–32)
Calcium: 8.8 mg/dL — ABNORMAL LOW (ref 8.9–10.3)
Chloride: 104 mmol/L (ref 98–111)
Creatinine, Ser: 1.61 mg/dL — ABNORMAL HIGH (ref 0.61–1.24)
GFR calc Af Amer: 57 mL/min — ABNORMAL LOW (ref >60)
GFR calc non Af Amer: 49 mL/min — ABNORMAL LOW (ref >60)
Glucose, Bld: 151 mg/dL — ABNORMAL HIGH (ref 70–99)
Potassium: 4.4 mmol/L (ref 3.5–5.1)
Sodium: 135 mmol/L (ref 135–145)

## 2019-08-27 LAB — HEMOGLOBIN AND HEMATOCRIT, BLOOD
HCT: 29.2 % — ABNORMAL LOW (ref 39.0–52.0)
HCT: 29 % — ABNORMAL LOW (ref 39.0–52.0)
Hemoglobin: 8.9 g/dL — ABNORMAL LOW (ref 13.0–17.0)
Hemoglobin: 9.2 g/dL — ABNORMAL LOW (ref 13.0–17.0)

## 2019-08-27 MED ORDER — CHLORHEXIDINE GLUCONATE CLOTH 2 % EX PADS: 6.0000 | MEDICATED_PAD | Freq: Every day | CUTANEOUS | Status: DC

## 2019-08-27 NOTE — Discharge Summary (Addendum)
Date of admission: 08/26/2019  Date of discharge: 08/27/2019  Admission diagnosis: Renal mass  Discharge diagnosis: Renal mass  History and Physical: For full details, please see admission history and physical. Briefly, Marcus Callahan is a 49 y.o. gentleman with a locally advanced left renal mass with possible liver metastasis.  After discussing management/treatment options, he elected to proceed with surgical treatment.  Hospital Course: Marcus Callahan was taken to the operating room on 08/26/2019 and underwent a robotic assisted laparoscopic left radical nephrectomy. He tolerated this procedure well and without complications. Postoperatively, he was able to be transferred to a regular hospital room following recovery from anesthesia.  He was able to begin ambulating the night of surgery. He remained hemodynamically stable overnight.  He had excellent urine output, foley was removed, and he passed trial of void on POD #1.  He was transitioned to oral pain medication, tolerated a regular diet, and had met all discharge criteria and was able to be discharged home later on POD#1.  Laboratory values:  Recent Labs    08/26/19 1552 08/27/19 0432 08/27/19 1128  HGB 9.9* 8.9* 9.2*  HCT 32.3* 29.2* 29.0*    Disposition: Home  Discharge instruction: He was instructed to be ambulatory but to refrain from heavy lifting, strenuous activity, or driving.   Discharge medications:   Allergies as of 08/27/2019      Reactions   Codeine Other (See Comments)   Nose bleeds      Medication List    STOP taking these medications   ELDERBERRY PO   multivitamin Liqd     TAKE these medications   docusate sodium 100 MG capsule Commonly known as: COLACE Take 1 capsule (100 mg total) by mouth 2 (two) times daily.   labetalol 100 MG tablet Commonly known as: NORMODYNE Take 1 tablet (100 mg total) by mouth 2 (two) times daily.   lisinopril-hydrochlorothiazide 20-25 MG tablet Commonly known  as: ZESTORETIC Take 1 tablet by mouth daily.   promethazine 12.5 MG tablet Commonly known as: PHENERGAN Take 1 tablet (12.5 mg total) by mouth every 4 (four) hours as needed for nausea or vomiting.   traMADol 50 MG tablet Commonly known as: Ultram Take 1-2 tablets (50-100 mg total) by mouth every 6 (six) hours as needed for moderate pain or severe pain.       Followup: He will followup as scheduled

## 2019-08-27 NOTE — Progress Notes (Signed)
1 Day Post-Op Subjective: The patient is doing well.  No nausea or vomiting. Pain is adequately controlled.  Objective: Vital signs in last 24 hours: Temp:  [97.5 F (36.4 C)-98.6 F (37 C)] 98.3 F (36.8 C) (02/25 0503) Pulse Rate:  [72-82] 72 (02/25 0503) Resp:  [12-18] 18 (02/25 0503) BP: (88-149)/(66-93) 149/91 (02/25 0503) SpO2:  [98 %-100 %] 98 % (02/25 0503) Weight:  [106.6 kg] 106.6 kg (02/24 1700)  Intake/Output from previous day: 02/24 0701 - 02/25 0700 In: 3427.9 [I.V.:3227.9; IV Piggyback:200] Out: 1290 [Urine:960; Blood:330] Intake/Output this shift: No intake/output data recorded.  Physical Exam:  General: Alert and oriented. CV: RRR Lungs: Clear bilaterally. GI: Soft, Nondistended. Incisions: Clean and dry. Urine: Clear Extremities: Nontender, no erythema, no edema.  Lab Results: Recent Labs    08/26/19 1552 08/27/19 0432  HGB 9.9* 8.9*  HCT 32.3* 29.2*          Recent Labs    08/21/19 1549 08/27/19 0432  CREATININE 1.63* 1.61*           Results for orders placed or performed during the hospital encounter of 08/26/19 (from the past 24 hour(s))  Hemoglobin and hematocrit, blood     Status: Abnormal   Collection Time: 08/26/19  3:52 PM  Result Value Ref Range   Hemoglobin 9.9 (L) 13.0 - 17.0 g/dL   HCT 32.3 (L) 39.0 - [id number] %  Basic metabolic panel     Status: Abnormal   Collection Time: 08/27/19  4:32 AM  Result Value Ref Range   Sodium 135 135 - 145 mmol/L   Potassium 4.4 3.5 - 5.1 mmol/L   Chloride 104 98 - 111 mmol/L   CO2 21 (L) 22 - 32 mmol/L   Glucose, Bld 151 (H) 70 - 99 mg/dL   BUN 22 (H) 6 - 20 mg/dL   Creatinine, Ser 1.61 (H) 0.61 - 1.24 mg/dL   Calcium 8.8 (L) 8.9 - 10.3 mg/dL   GFR calc non Af Amer 49 (L) >60 mL/min   GFR calc Af Amer 57 (L) >60 mL/min   Anion gap 10 5 - 15  Hemoglobin and hematocrit, blood     Status: Abnormal   Collection Time: 08/27/19  4:32 AM  Result Value Ref Range   Hemoglobin 8.9 (L) 13.0 - 17.0  g/dL   HCT 29.2 (L) 39.0 - 52.0 %    Assessment/Plan: POD# 1 s/p robotic radical left nephrectomy.  1) Ambulate, Incentive spirometry 2) Advance diet as tolerated 3) Transition to oral pain medication 4) D/C urethral catheter    LOS: 0 days   Haskel Schroeder 08/27/2019, 7:13 AM

## 2019-08-27 NOTE — Progress Notes (Signed)
Sister Joelene Millin called and updated.

## 2019-08-28 LAB — SURGICAL PATHOLOGY

## 2019-09-07 ENCOUNTER — Telehealth: Payer: Self-pay | Admitting: Oncology

## 2019-09-07 NOTE — Telephone Encounter (Signed)
Rescheduled per 3/8 sch msg, pt req. Called and spoke with pt, confirmed 3/9 appt 

## 2019-09-08 ENCOUNTER — Encounter: Payer: Self-pay | Admitting: Oncology

## 2019-09-08 ENCOUNTER — Other Ambulatory Visit: Payer: Self-pay

## 2019-09-08 ENCOUNTER — Telehealth: Payer: Self-pay

## 2019-09-08 ENCOUNTER — Inpatient Hospital Stay: Payer: BC Managed Care – PPO | Attending: Oncology | Admitting: Oncology

## 2019-09-08 ENCOUNTER — Telehealth: Payer: Self-pay | Admitting: Pharmacist

## 2019-09-08 DIAGNOSIS — C642 Malignant neoplasm of left kidney, except renal pelvis: Secondary | ICD-10-CM | POA: Insufficient documentation

## 2019-09-08 DIAGNOSIS — Z5112 Encounter for antineoplastic immunotherapy: Secondary | ICD-10-CM | POA: Diagnosis not present

## 2019-09-08 DIAGNOSIS — I10 Essential (primary) hypertension: Secondary | ICD-10-CM | POA: Insufficient documentation

## 2019-09-08 DIAGNOSIS — Z7189 Other specified counseling: Secondary | ICD-10-CM | POA: Diagnosis not present

## 2019-09-08 DIAGNOSIS — Z905 Acquired absence of kidney: Secondary | ICD-10-CM | POA: Insufficient documentation

## 2019-09-08 DIAGNOSIS — C787 Secondary malignant neoplasm of liver and intrahepatic bile duct: Secondary | ICD-10-CM | POA: Diagnosis not present

## 2019-09-08 DIAGNOSIS — C649 Malignant neoplasm of unspecified kidney, except renal pelvis: Secondary | ICD-10-CM | POA: Insufficient documentation

## 2019-09-08 DIAGNOSIS — Z79899 Other long term (current) drug therapy: Secondary | ICD-10-CM | POA: Insufficient documentation

## 2019-09-08 MED ORDER — AXITINIB 5 MG PO TABS: 5.0000 mg | ORAL_TABLET | Freq: Two times a day (BID) | ORAL | 0 refills | Status: DC

## 2019-09-08 NOTE — Progress Notes (Signed)
START ON PATHWAY REGIMEN - Renal Cell     A cycle is every 21 days:     Axitinib      Pembrolizumab   **Always confirm dose/schedule in your pharmacy ordering system**  Patient Characteristics: Stage IV/Metastatic Disease, Clear Cell, First Line, Intermediate or Poor Risk Therapeutic Status: Stage IV/Metastatic Disease Histology: Clear Cell Line of Therapy: First Line Risk Status: Intermediate Risk Intent of Therapy: Non-Curative / Palliative Intent, Discussed with Patient 

## 2019-09-08 NOTE — Progress Notes (Signed)
Hematology and Oncology Follow Up Visit  IMANOL FETCHKO [id number] 10-Mar-1971 49 y.o. 09/08/2019 1:18 PM [place], [place], [place], [place]   Principle Diagnosis: 49 year old man with stage IV clear-cell renal cell carcinoma diagnosed in February 2021.  He was found to have a 10.2 cm tumor of the left kidney with invasion into the pararenal soft tissue, renal pelvis indicating T3a disease.  He has hepatic metastasis at the time of diagnosis.   Prior Therapy:  He is status post robotic assisted laparoscopic left radical nephrectomy completed on August 26, 2019.  Final pathology indicated T3a tumor with invasion into the perirenal soft tissue, renal pelvis and segmental renal vein and renal sinus.  Current therapy: Postoperative recovery and consideration for start systemic therapy.  Interim History:  Mr. Rozario is here for a repeat evaluation.  Since the last visit, he had completed his nephrectomy without any major complications.  He tolerated the procedure well and has fully recovered.  He has regained all activities of daily living without any decline.  He denies any abdominal pain flank pain or hematuria.  His performance status and activity level has returned to baseline.     Medications: I have reviewed the patient's current medications.  Current Outpatient Medications  Medication Sig Dispense Refill  . docusate sodium (COLACE) 100 MG capsule Take 1 capsule (100 mg total) by mouth 2 (two) times daily.    Marland Kitchen labetalol (NORMODYNE) 100 MG tablet Take 1 tablet (100 mg total) by mouth 2 (two) times daily. 180 tablet 3  . lisinopril-hydrochlorothiazide (ZESTORETIC) 20-25 MG tablet Take 1 tablet by mouth daily. 90 tablet 3  . promethazine (PHENERGAN) 12.5 MG tablet Take 1 tablet (12.5 mg total) by mouth every 4 (four) hours as needed for nausea or vomiting. 15 tablet 0  . traMADol (ULTRAM) 50 MG tablet Take 1-2 tablets (50-100 mg total) by mouth every 6 (six) hours as  needed for moderate pain or severe pain. 30 tablet 0   No current facility-administered medications for this visit.     Allergies:  Allergies  Allergen Reactions  . Codeine Other (See Comments)    Nose bleeds      Physical Exam: Blood pressure 124/82, pulse 89, temperature 99.1 F (37.3 C), temperature source Temporal, resp. rate 18, height 5\' 10"  (1.778 m), weight 228 lb 8 oz (103.6 kg), SpO2 100 %.   ECOG: 0   General appearance: Comfortable appearing without any discomfort Head: Normocephalic without any trauma Oropharynx: Mucous membranes are moist and pink without any thrush or ulcers. Eyes: Pupils are equal and round reactive to light. Lymph nodes: No cervical, supraclavicular, inguinal or axillary lymphadenopathy.   Heart:regular rate and rhythm.  S1 and S2 without leg edema. Lung: Clear without any rhonchi or wheezes.  No dullness to percussion. Abdomin: Soft, nontender, nondistended with good bowel sounds.  No hepatosplenomegaly. Musculoskeletal: No joint deformity or effusion.  Full range of motion noted. Neurological: No deficits noted on motor, sensory and deep tendon reflex exam. Skin: No petechial rash or dryness.  Appeared moist.     Lab Results: Lab Results  Component Value Date   WBC 9.6 08/21/2019   HGB 9.2 (L) 08/27/2019   HCT 29.0 (L) 08/27/2019   MCV 82.5 08/21/2019   PLT 383 08/21/2019     Chemistry      Component Value Date/Time   NA 135 08/27/2019 0432   NA 134 07/06/2019 1702   K 4.4 08/27/2019 0432   CL 104 08/27/2019 0432  CO2 21 (L) 08/27/2019 0432   BUN 22 (H) 08/27/2019 0432   BUN 21 07/06/2019 1702   CREATININE 1.61 (H) 08/27/2019 0432      Component Value Date/Time   CALCIUM 8.8 (L) 08/27/2019 0432   ALKPHOS 48 03/16/2019 1715   AST 12 03/16/2019 1715   ALT 10 03/16/2019 1715   BILITOT 0.5 03/16/2019 1715          Impression and Plan:  49 year old man with:  1.  Stage IV clear-cell renal cell carcinoma  documented in February 2021.  He presented with Left kidney mass with hepatic metastasis.  The final pathology revealed a T3a tumor after radical cystectomy.   The natural course of this disease was reviewed at this time.  Treatment options were reiterated at this time.  Systemic therapy will be needed after he has completed his surgery.  Treatment options would include single agent immunotherapy, single agent oral targeted therapy, combination of immunotherapy as well as combination of immunotherapy with oral targeted therapy.  Given his hepatic metastasis and likely high risk disease, I have opted to recommend oral targeted therapy with immunotherapy.  Pembrolizumab and axitinib would be a reasonable option at this time.  Complication associated with this therapy can include nausea, fatigue, hypertension as well as anorexia and weight loss were reiterated.  Immune mediated complications were also discussed.  After discussion today he is agreeable to proceed with this treatment.  The plan is to proceed with axitinib 5 mg twice a day continuously with Pembrolizumab 200 mg intravenously every 3 weeks.  We will repeat imaging studies for staging purposes after 3 months.  Prior to starting therapy I like to update his imaging study of the chest with a CT scan.  2.  Hypertension: His blood pressure is within normal range at this time we will continue to monitor on axitinib.  3.  Immune mediated complications.  These include pneumonitis, colitis, thyroid disease among others were reviewed.  We will continue to educate and follow things issues with him.  4.  Nausea prophylaxis: He has a prescription for Phenergan is available to him.  5.  IV access: Peripheral veins will be used for the time being we will defer the option of a Port-A-Cath.  6.  Follow-up: We will be in the immediate future at the start of therapy.  30 minutes were spent on the visit.  The time was dedicated to reviewing pathology  reports, treatment options and complications related therapy.    Zola Button, MD 3/9/20211:18 PM

## 2019-09-08 NOTE — Telephone Encounter (Signed)
Oral Oncology Pharmacist Encounter  Received new prescription for Inlyta (axitinib) for the treatment of metastatic clear-cell RCC in conjunction with pembrolizumb, planned duration until disease progression or unacceptable drug toxicity.  BP from 09/08/19 assessed, BP well controlled, continue to monitor. UA from 08/21/19 negative for protein. TSH and CMP lab ordered ended. Assess CMP for hepatic impairment. Prescription dose and frequency assessed.   Current medication list in Epic reviewed, no DDIs with axitinib identified.  Prescription has been e-scribed to the [facility] for benefits analysis and approval.  Oral Oncology Clinic will continue to follow for insurance authorization, copayment issues, initial counseling and start date.  Darl Pikes, PharmD, BCPS, BCOP, CPP Hematology/Oncology Clinical Pharmacist [facility] [phone]  09/08/2019 2:04 PM

## 2019-09-08 NOTE — Telephone Encounter (Signed)
Oral Oncology Patient Advocate Encounter  Received notification from [facility] [place] that prior authorization for Inlyta is required.  PA submitted on CoverMyMeds Key [id number] Status is pending  Oral Oncology Clinic will continue to follow.  [facility] [place] Phone [phone] Fax [phone] 09/08/2019 2:22 PM

## 2019-09-09 ENCOUNTER — Telehealth: Payer: Self-pay | Admitting: Oncology

## 2019-09-09 MED ORDER — AXITINIB 5 MG PO TABS: 5.0000 mg | ORAL_TABLET | Freq: Two times a day (BID) | ORAL | 0 refills | Status: DC

## 2019-09-09 NOTE — Telephone Encounter (Signed)
Oral Chemotherapy Pharmacist Encounter  Due to insurance restriction the medication could not be filled at [facility]. Prescription has been e-scribed to [facility].  Supportive information was faxed to [place]. We will continue to follow medication access.   Attempted to call Marcus Callahan to let him know about the change in pharmacy, no answer. LVM for him to call be back.  Darl Pikes, PharmD, BCPS, [facility] Hematology/Oncology Clinical Pharmacist [facility] [phone]  09/09/2019 9:24 AM

## 2019-09-09 NOTE — Telephone Encounter (Signed)
Oral Chemotherapy Pharmacist Encounter  [name] returned my phone call and I let him know that his insurance was requiring he use [facility]. He knows to expect a call from their pharmacy.  Patient Education I spoke with patient for overview of new oral chemotherapy medication: Inlyta (axitinib) for the treatment of metastatic clear-cell RCC in conjunction with pembrolizumb, planned duration until disease progression or unacceptable drug toxicity.   Pt is doing well. Counseled patient on administration, dosing, side effects, monitoring, drug-food interactions, safe handling, storage, and disposal. Patient will take 1 tablet (5 mg total) by mouth 2 (two) times daily.  Side effects include but not limited to: HTN, diarrhea, N/V, decreased wbc.    Reviewed with patient importance of keeping a medication schedule and plan for any missed doses.  [name] voiced understanding and appreciation. All questions answered. Medication handout placed in the mail.  Provided patient with Oral [facility] phone number. Patient knows to call the office with questions or concerns. Oral Chemotherapy Navigation Clinic will continue to follow.  Darl Pikes, PharmD, BCPS, BCOP, CPP Hematology/Oncology Clinical Pharmacist [facility] [phone]  09/09/2019 10:14 AM

## 2019-09-09 NOTE — Telephone Encounter (Signed)
Scheduled appt per 3/9 los.  Spoke with pt and he is aware of his scheduled appt dates and time.

## 2019-09-10 ENCOUNTER — Telehealth: Payer: Self-pay | Admitting: Oncology

## 2019-09-10 ENCOUNTER — Inpatient Hospital Stay: Payer: BC Managed Care – PPO | Admitting: Oncology

## 2019-09-10 DIAGNOSIS — C642 Malignant neoplasm of left kidney, except renal pelvis: Secondary | ICD-10-CM | POA: Diagnosis not present

## 2019-09-11 ENCOUNTER — Inpatient Hospital Stay: Payer: BC Managed Care – PPO

## 2019-09-14 ENCOUNTER — Encounter: Payer: Self-pay | Admitting: Emergency Medicine

## 2019-09-14 ENCOUNTER — Other Ambulatory Visit: Payer: Self-pay

## 2019-09-14 ENCOUNTER — Ambulatory Visit (INDEPENDENT_AMBULATORY_CARE_PROVIDER_SITE_OTHER): Payer: BC Managed Care – PPO | Admitting: Emergency Medicine

## 2019-09-14 VITALS — BP 137/86 | HR 79 | Temp 97.9°F | Resp 16 | Ht 70.0 in | Wt 231.0 lb

## 2019-09-14 DIAGNOSIS — I10 Essential (primary) hypertension: Secondary | ICD-10-CM

## 2019-09-14 DIAGNOSIS — C642 Malignant neoplasm of left kidney, except renal pelvis: Secondary | ICD-10-CM

## 2019-09-14 NOTE — Patient Instructions (Addendum)
   If you have lab work done today you will be contacted with your lab results within the next 2 weeks.  If you have not heard from us then please contact us. The fastest way to get your results is to register for My Chart.   IF you received an x-ray today, you will receive an invoice from [facility]. Please contact [facility] at [phone] with questions or concerns regarding your invoice.   IF you received labwork today, you will receive an invoice from [facility]. Please contact [facility] at [phone] with questions or concerns regarding your invoice.   Our billing staff will not be able to assist you with questions regarding bills from these companies.  You will be contacted with the lab results as soon as they are available. The fastest way to get your results is to activate your My Chart account. Instructions are located on the last page of this paperwork. If you have not heard from us regarding the results in 2 weeks, please contact this office.      Hypertension, Adult High blood pressure (hypertension) is when the force of blood pumping through the arteries is too strong. The arteries are the blood vessels that carry blood from the heart throughout the body. Hypertension forces the heart to work harder to pump blood and may cause arteries to become narrow or stiff. Untreated or uncontrolled hypertension can cause a heart attack, heart failure, a stroke, kidney disease, and other problems. A blood pressure reading consists of a higher number over a lower number. Ideally, your blood pressure should be below 120/80. The first ("top") number is called the systolic pressure. It is a measure of the pressure in your arteries as your heart beats. The second ("bottom") number is called the diastolic pressure. It is a measure of the pressure in your arteries as the heart relaxes. What are the causes? The exact cause of this condition is not known. There are some conditions  that result in or are related to high blood pressure. What increases the risk? Some risk factors for high blood pressure are under your control. The following factors may make you more likely to develop this condition:  Smoking.  Having type 2 diabetes mellitus, high cholesterol, or both.  Not getting enough exercise or physical activity.  Being overweight.  Having too much fat, sugar, calories, or salt (sodium) in your diet.  Drinking too much alcohol. Some risk factors for high blood pressure may be difficult or impossible to change. Some of these factors include:  Having chronic kidney disease.  Having a family history of high blood pressure.  Age. Risk increases with age.  Race. You may be at higher risk if you are African American.  Gender. Men are at higher risk than women before age 45. After age 65, women are at higher risk than men.  Having obstructive sleep apnea.  Stress. What are the signs or symptoms? High blood pressure may not cause symptoms. Very high blood pressure (hypertensive crisis) may cause:  Headache.  Anxiety.  Shortness of breath.  Nosebleed.  Nausea and vomiting.  Vision changes.  Severe chest pain.  Seizures. How is this diagnosed? This condition is diagnosed by measuring your blood pressure while you are seated, with your arm resting on a flat surface, your legs uncrossed, and your feet flat on the floor. The cuff of the blood pressure monitor will be placed directly against the skin of your upper arm at the level of your   heart. It should be measured at least twice using the same arm. Certain conditions can cause a difference in blood pressure between your right and left arms. Certain factors can cause blood pressure readings to be lower or higher than normal for a short period of time:  When your blood pressure is higher when you are in a health care provider's office than when you are at home, this is called white coat hypertension.  Most people with this condition do not need medicines.  When your blood pressure is higher at home than when you are in a health care provider's office, this is called masked hypertension. Most people with this condition may need medicines to control blood pressure. If you have a high blood pressure reading during one visit or you have normal blood pressure with other risk factors, you may be asked to:  Return on a different day to have your blood pressure checked again.  Monitor your blood pressure at home for 1 week or longer. If you are diagnosed with hypertension, you may have other blood or imaging tests to help your health care provider understand your overall risk for other conditions. How is this treated? This condition is treated by making healthy lifestyle changes, such as eating healthy foods, exercising more, and reducing your alcohol intake. Your health care provider may prescribe medicine if lifestyle changes are not enough to get your blood pressure under control, and if:  Your systolic blood pressure is above 130.  Your diastolic blood pressure is above 80. Your personal target blood pressure may vary depending on your medical conditions, your age, and other factors. Follow these instructions at home: Eating and drinking   Eat a diet that is high in fiber and potassium, and low in sodium, added sugar, and fat. An example eating plan is called the DASH (Dietary Approaches to Stop Hypertension) diet. To eat this way: ? Eat plenty of fresh fruits and vegetables. Try to fill one half of your plate at each meal with fruits and vegetables. ? Eat whole grains, such as whole-wheat pasta, brown rice, or whole-grain bread. Fill about one fourth of your plate with whole grains. ? Eat or drink low-fat dairy products, such as skim milk or low-fat yogurt. ? Avoid fatty cuts of meat, processed or cured meats, and poultry with skin. Fill about one fourth of your plate with lean proteins, such  as fish, chicken without skin, beans, eggs, or tofu. ? Avoid pre-made and processed foods. These tend to be higher in sodium, added sugar, and fat.  Reduce your daily sodium intake. Most people with hypertension should eat less than 1,500 mg of sodium a day.  Do not drink alcohol if: ? Your health care provider tells you not to drink. ? You are pregnant, may be pregnant, or are planning to become pregnant.  If you drink alcohol: ? Limit how much you use to:  0-1 drink a day for women.  0-2 drinks a day for men. ? Be aware of how much alcohol is in your drink. In the [place] one drink equals one 12 oz bottle of beer (355 mL), one 5 oz glass of wine (148 mL), or one 1 oz glass of hard liquor (44 mL). Lifestyle   Work with your health care provider to maintain a healthy body weight or to lose weight. Ask what an ideal weight is for you.  Get at least 30 minutes of exercise most days of the week. Activities may include walking, swimming,   or biking.  Include exercise to strengthen your muscles (resistance exercise), such as Pilates or lifting weights, as part of your weekly exercise routine. Try to do these types of exercises for 30 minutes at least 3 days a week.  Do not use any products that contain nicotine or tobacco, such as cigarettes, e-cigarettes, and chewing tobacco. If you need help quitting, ask your health care provider.  Monitor your blood pressure at home as told by your health care provider.  Keep all follow-up visits as told by your health care provider. This is important. Medicines  Take over-the-counter and prescription medicines only as told by your health care provider. Follow directions carefully. Blood pressure medicines must be taken as prescribed.  Do not skip doses of blood pressure medicine. Doing this puts you at risk for problems and can make the medicine less effective.  Ask your health care provider about side effects or reactions to medicines that you  should watch for. Contact a health care provider if you:  Think you are having a reaction to a medicine you are taking.  Have headaches that keep coming back (recurring).  Feel dizzy.  Have swelling in your ankles.  Have trouble with your vision. Get help right away if you:  Develop a severe headache or confusion.  Have unusual weakness or numbness.  Feel faint.  Have severe pain in your chest or abdomen.  Vomit repeatedly.  Have trouble breathing. Summary  Hypertension is when the force of blood pumping through your arteries is too strong. If this condition is not controlled, it may put you at risk for serious complications.  Your personal target blood pressure may vary depending on your medical conditions, your age, and other factors. For most people, a normal blood pressure is less than 120/80.  Hypertension is treated with lifestyle changes, medicines, or a combination of both. Lifestyle changes include losing weight, eating a healthy, low-sodium diet, exercising more, and limiting alcohol. This information is not intended to replace advice given to you by your health care provider. Make sure you discuss any questions you have with your health care provider. Document Revised: 02/26/2018 Document Reviewed: 02/26/2018 Elsevier Patient Education  2020 [employer]  

## 2019-09-14 NOTE — Progress Notes (Signed)
Marcus Callahan 49 y.o.   Chief Complaint  Patient presents with  . Hypertension    follow up  6 month  Date of admission: 08/26/2019  Date of discharge: 08/27/2019  Admission diagnosis: Renal mass  Discharge diagnosis: Renal mass  History and Physical: For full details, please see admission history and physical. Briefly, Marcus Callahan is a 49 y.o. gentleman with a locally advanced left renal mass with possible liver metastasis.  After discussing management/treatment options, he elected to proceed with surgical treatment.  Hospital Course: Marcus Callahan was taken to the operating room on 08/26/2019 and underwent a robotic assisted laparoscopic left radical nephrectomy. He tolerated this procedure well and without complications. Postoperatively, he was able to be transferred to a regular hospital room following recovery from anesthesia.  He was able to begin ambulating the night of surgery. He remained hemodynamically stable overnight.  He had excellent urine output, foley was removed, and he passed trial of void on POD #1.  He was transitioned to oral pain medication, tolerated a regular diet, and had met all discharge criteria and was able to be discharged home later on POD#1.   HISTORY OF PRESENT ILLNESS: This is a 49 y.o. male with history of hypertension here for follow-up: 1.  Hypertension: On labetalol 100 mg twice a day and Zestoretic 20-25 mg once a day. BP Readings from Last 3 Encounters:  09/14/19 137/86  09/08/19 124/82  08/27/19 124/74   2.  Left renal cancer with metastatic lesions to the liver.  Status post left nephrectomy.  Scheduled to see the oncologist next Friday and urologist next month. No complaints or other medical concerns today.  HPI   Prior to Admission medications   Medication Sig Start Date End Date Taking? Authorizing Provider  labetalol (NORMODYNE) 100 MG tablet Take 1 tablet (100 mg total) by mouth 2 (two) times daily. 12/09/18  08/13/20 Yes Manasvini Whatley, Ines Bloomer, MD  lisinopril-hydrochlorothiazide (ZESTORETIC) 20-25 MG tablet Take 1 tablet by mouth daily. 11/18/18  Yes Pantera Winterrowd, Ines Bloomer, MD  axitinib (INLYTA) 5 MG tablet Take 1 tablet (5 mg total) by mouth 2 (two) times daily. Patient not taking: Reported on 09/14/2019 09/09/19   Wyatt Portela, MD  docusate sodium (COLACE) 100 MG capsule Take 1 capsule (100 mg total) by mouth 2 (two) times daily. Patient not taking: Reported on 09/14/2019 08/26/19   Debbrah Alar, PA-C  promethazine (PHENERGAN) 12.5 MG tablet Take 1 tablet (12.5 mg total) by mouth every 4 (four) hours as needed for nausea or vomiting. Patient not taking: Reported on 09/14/2019 08/26/19   Debbrah Alar, PA-C  traMADol (ULTRAM) 50 MG tablet Take 1-2 tablets (50-100 mg total) by mouth every 6 (six) hours as needed for moderate pain or severe pain. Patient not taking: Reported on 09/14/2019 08/26/19   Debbrah Alar, PA-C    Allergies  Allergen Reactions  . Codeine Other (See Comments)    Nose bleeds    Patient Active Problem List   Diagnosis Date Noted  . Goals of care, counseling/discussion 09/08/2019  . Kidney cancer, primary, with metastasis from kidney to other site [facility]) 09/08/2019  . Renal mass 08/26/2019  . Uncontrolled hypertension 11/18/2018    Past Medical History:  Diagnosis Date  . Allergy    no diagnosis per pt   . Hypertension     Past Surgical History:  Procedure Laterality Date  . CYSTOSCOPY N/A 08/26/2019   Procedure: Erlene Quan;  Surgeon: Ceasar Mons, MD;  Location: [facility];  Service: Urology;  Laterality: N/A;  . ROBOT ASSISTED LAPAROSCOPIC NEPHRECTOMY Left 08/26/2019   Procedure: XI ROBOTIC ASSISTED LAPAROSCOPIC NEPHRECTOMY;  Surgeon: Ceasar Mons, MD;  Location: [facility];  Service: Urology;  Laterality: Left;  . surgery as a child     thinks matbe was a hernia repair as a premature baby     Social History   Socioeconomic History  .  Marital status: Single    Spouse name: Not on file  . Number of children: Not on file  . Years of education: Not on file  . Highest education level: Not on file  Occupational History  . Not on file  Tobacco Use  . Smoking status: Never Smoker  . Smokeless tobacco: Never Used  Substance and Sexual Activity  . Alcohol use: Yes    Comment: occassionaly  . Drug use: Never  . Sexual activity: Not on file  Other Topics Concern  . Not on file  Social History Narrative  . Not on file   Social Determinants of Health   Financial Resource Strain:   . Difficulty of Paying Living Expenses:   Food Insecurity:   . Worried About Charity fundraiser in the Last Year:   . Arboriculturist in the Last Year:   Transportation Needs:   . Film/video editor (Medical):   Marland Kitchen Lack of Transportation (Non-Medical):   Physical Activity:   . Days of Exercise per Week:   . Minutes of Exercise per Session:   Stress:   . Feeling of Stress :   Social Connections:   . Frequency of Communication with Friends and Family:   . Frequency of Social Gatherings with Friends and Family:   . Attends Religious Services:   . Active Member of Clubs or Organizations:   . Attends Archivist Meetings:   Marland Kitchen Marital Status:   Intimate Partner Violence:   . Fear of Current or Ex-Partner:   . Emotionally Abused:   Marland Kitchen Physically Abused:   . Sexually Abused:     Family History  Problem Relation Age of Onset  . Colon polyps Mother   . Colon cancer Neg Hx   . Esophageal cancer Neg Hx   . Prostate cancer Neg Hx   . Rectal cancer Neg Hx      Review of Systems  Constitutional: Negative.  Negative for chills and fever.  HENT: Negative.  Negative for congestion and sore throat.   Cardiovascular: Negative.  Negative for chest pain and palpitations.  Gastrointestinal: Negative.  Negative for abdominal pain, blood in stool, constipation, nausea and vomiting.  Skin: Negative.  Negative for rash.    Neurological: Negative for dizziness and headaches.  All other systems reviewed and are negative.  Vitals:   09/14/19 1348  BP: 137/86  Pulse: 79  Resp: 16  Temp: 97.9 F (36.6 C)  SpO2: 100%     Physical Exam Vitals reviewed.  Constitutional:      Appearance: Normal appearance.  HENT:     Head: Normocephalic.  Eyes:     Extraocular Movements: Extraocular movements intact.     Pupils: Pupils are equal, round, and reactive to light.  Cardiovascular:     Rate and Rhythm: Normal rate and regular rhythm.     Pulses: Normal pulses.     Heart sounds: Normal heart sounds.  Pulmonary:     Effort: Pulmonary effort is normal.     Breath sounds: Normal breath sounds.  Abdominal:  Palpations: Abdomen is soft.     Tenderness: There is no abdominal tenderness.  Musculoskeletal:        General: Normal range of motion.     Cervical back: Normal range of motion and neck supple.  Skin:    General: Skin is warm and dry.     Capillary Refill: Capillary refill takes less than 2 seconds.  Neurological:     General: No focal deficit present.     Mental Status: He is alert and oriented to person, place, and time.  Psychiatric:        Mood and Affect: Mood normal.        Behavior: Behavior normal.      ASSESSMENT & PLAN: Andros was seen today for hypertension.  Diagnoses and all orders for this visit:  Essential hypertension  Primary malignant neoplasm of left kidney with metastasis from kidney to other site [facility])    Patient Instructions       If you have lab work done today you will be contacted with your lab results within the next 2 weeks.  If you have not heard from [place] then please contact us. The fastest way to get your results is to register for My Chart.   IF you received an x-ray today, you will receive an invoice from [facility]. Please contact [facility] at [phone] with questions or concerns regarding your invoice.   IF you received  labwork today, you will receive an invoice from [facility]. Please contact [facility] at [phone] with questions or concerns regarding your invoice.   Our billing staff will not be able to assist you with questions regarding bills from these companies.  You will be contacted with the lab results as soon as they are available. The fastest way to get your results is to activate your My Chart account. Instructions are located on the last page of this paperwork. If you have not heard from [place] regarding the results in 2 weeks, please contact this office.     Hypertension, Adult High blood pressure (hypertension) is when the force of blood pumping through the arteries is too strong. The arteries are the blood vessels that carry blood from the heart throughout the body. Hypertension forces the heart to work harder to pump blood and may cause arteries to become narrow or stiff. Untreated or uncontrolled hypertension can cause a heart attack, heart failure, a stroke, kidney disease, and other problems. A blood pressure reading consists of a higher number over a lower number. Ideally, your blood pressure should be below 120/80. The first ("top") number is called the systolic pressure. It is a measure of the pressure in your arteries as your heart beats. The second ("bottom") number is called the diastolic pressure. It is a measure of the pressure in your arteries as the heart relaxes. What are the causes? The exact cause of this condition is not known. There are some conditions that result in or are related to high blood pressure. What increases the risk? Some risk factors for high blood pressure are under your control. The following factors may make you more likely to develop this condition:  Smoking.  Having type 2 diabetes mellitus, high cholesterol, or both.  Not getting enough exercise or physical activity.  Being overweight.  Having too much fat, sugar, calories, or salt (sodium) in your  diet.  Drinking too much alcohol. Some risk factors for high blood pressure may be difficult or impossible to change. Some of these factors include:  Having chronic kidney disease.  Having a family history of high blood pressure.  Age. Risk increases with age.  Race. You may be at higher risk if you are African American.  Gender. Men are at higher risk than women before age 2. After age 85, women are at higher risk than men.  Having obstructive sleep apnea.  Stress. What are the signs or symptoms? High blood pressure may not cause symptoms. Very high blood pressure (hypertensive crisis) may cause:  Headache.  Anxiety.  Shortness of breath.  Nosebleed.  Nausea and vomiting.  Vision changes.  Severe chest pain.  Seizures. How is this diagnosed? This condition is diagnosed by measuring your blood pressure while you are seated, with your arm resting on a flat surface, your legs uncrossed, and your feet flat on the floor. The cuff of the blood pressure monitor will be placed directly against the skin of your upper arm at the level of your heart. It should be measured at least twice using the same arm. Certain conditions can cause a difference in blood pressure between your right and left arms. Certain factors can cause blood pressure readings to be lower or higher than normal for a short period of time:  When your blood pressure is higher when you are in a health care provider's office than when you are at home, this is called white coat hypertension. Most people with this condition do not need medicines.  When your blood pressure is higher at home than when you are in a health care provider's office, this is called masked hypertension. Most people with this condition may need medicines to control blood pressure. If you have a high blood pressure reading during one visit or you have normal blood pressure with other risk factors, you may be asked to:  Return on a different day  to have your blood pressure checked again.  Monitor your blood pressure at home for 1 week or longer. If you are diagnosed with hypertension, you may have other blood or imaging tests to help your health care provider understand your overall risk for other conditions. How is this treated? This condition is treated by making healthy lifestyle changes, such as eating healthy foods, exercising more, and reducing your alcohol intake. Your health care provider may prescribe medicine if lifestyle changes are not enough to get your blood pressure under control, and if:  Your systolic blood pressure is above 130.  Your diastolic blood pressure is above 80. Your personal target blood pressure may vary depending on your medical conditions, your age, and other factors. Follow these instructions at home: Eating and drinking   Eat a diet that is high in fiber and potassium, and low in sodium, added sugar, and fat. An example eating plan is called the DASH (Dietary Approaches to Stop Hypertension) diet. To eat this way: ? Eat plenty of fresh fruits and vegetables. Try to fill one half of your plate at each meal with fruits and vegetables. ? Eat whole grains, such as whole-wheat pasta, brown rice, or whole-grain bread. Fill about one fourth of your plate with whole grains. ? Eat or drink low-fat dairy products, such as skim milk or low-fat yogurt. ? Avoid fatty cuts of meat, processed or cured meats, and poultry with skin. Fill about one fourth of your plate with lean proteins, such as fish, chicken without skin, beans, eggs, or tofu. ? Avoid pre-made and processed foods. These tend to be higher in sodium, added sugar, and fat.  Reduce your daily sodium intake. Most people with hypertension should eat less than 1,500 mg of sodium a day.  Do not drink alcohol if: ? Your health care provider tells you not to drink. ? You are pregnant, may be pregnant, or are planning to become pregnant.  If you drink  alcohol: ? Limit how much you use to:  0-1 drink a day for women.  0-2 drinks a day for men. ? Be aware of how much alcohol is in your drink. In the [place] one drink equals one 12 oz bottle of beer (355 mL), one 5 oz glass of wine (148 mL), or one 1 oz glass of hard liquor (44 mL). Lifestyle   Work with your health care provider to maintain a healthy body weight or to lose weight. Ask what an ideal weight is for you.  Get at least 30 minutes of exercise most days of the week. Activities may include walking, swimming, or biking.  Include exercise to strengthen your muscles (resistance exercise), such as Pilates or lifting weights, as part of your weekly exercise routine. Try to do these types of exercises for 30 minutes at least 3 days a week.  Do not use any products that contain nicotine or tobacco, such as cigarettes, e-cigarettes, and chewing tobacco. If you need help quitting, ask your health care provider.  Monitor your blood pressure at home as told by your health care provider.  Keep all follow-up visits as told by your health care provider. This is important. Medicines  Take over-the-counter and prescription medicines only as told by your health care provider. Follow directions carefully. Blood pressure medicines must be taken as prescribed.  Do not skip doses of blood pressure medicine. Doing this puts you at risk for problems and can make the medicine less effective.  Ask your health care provider about side effects or reactions to medicines that you should watch for. Contact a health care provider if you:  Think you are having a reaction to a medicine you are taking.  Have headaches that keep coming back (recurring).  Feel dizzy.  Have swelling in your ankles.  Have trouble with your vision. Get help right away if you:  Develop a severe headache or confusion.  Have unusual weakness or numbness.  Feel faint.  Have severe pain in your chest or abdomen.  Vomit  repeatedly.  Have trouble breathing. Summary  Hypertension is when the force of blood pumping through your arteries is too strong. If this condition is not controlled, it may put you at risk for serious complications.  Your personal target blood pressure may vary depending on your medical conditions, your age, and other factors. For most people, a normal blood pressure is less than 120/80.  Hypertension is treated with lifestyle changes, medicines, or a combination of both. Lifestyle changes include losing weight, eating a healthy, low-sodium diet, exercising more, and limiting alcohol. This information is not intended to replace advice given to you by your health care provider. Make sure you discuss any questions you have with your health care provider. Document Revised: 02/26/2018 Document Reviewed: 02/26/2018 Elsevier Patient Education  2020 Elsevier Inc.      Agustina Caroli, MD [facility]

## 2019-09-18 ENCOUNTER — Other Ambulatory Visit: Payer: Self-pay

## 2019-09-18 ENCOUNTER — Encounter: Payer: Self-pay | Admitting: Oncology

## 2019-09-18 ENCOUNTER — Inpatient Hospital Stay: Payer: BC Managed Care – PPO

## 2019-09-18 VITALS — BP 136/85 | HR 71 | Temp 98.2°F | Resp 17 | Wt 232.8 lb

## 2019-09-18 DIAGNOSIS — C787 Secondary malignant neoplasm of liver and intrahepatic bile duct: Secondary | ICD-10-CM | POA: Diagnosis not present

## 2019-09-18 DIAGNOSIS — C642 Malignant neoplasm of left kidney, except renal pelvis: Secondary | ICD-10-CM

## 2019-09-18 DIAGNOSIS — Z5112 Encounter for antineoplastic immunotherapy: Secondary | ICD-10-CM | POA: Diagnosis not present

## 2019-09-18 DIAGNOSIS — Z79899 Other long term (current) drug therapy: Secondary | ICD-10-CM | POA: Diagnosis not present

## 2019-09-18 DIAGNOSIS — Z905 Acquired absence of kidney: Secondary | ICD-10-CM | POA: Diagnosis not present

## 2019-09-18 DIAGNOSIS — I10 Essential (primary) hypertension: Secondary | ICD-10-CM | POA: Diagnosis not present

## 2019-09-18 LAB — CBC WITH DIFFERENTIAL (CANCER CENTER ONLY)
Abs Immature Granulocytes: 0.04 10*3/uL (ref 0.00–0.07)
Basophils Absolute: 0 10*3/uL (ref 0.0–0.1)
Basophils Relative: 0 %
Eosinophils Absolute: 0.2 10*3/uL (ref 0.0–0.5)
Eosinophils Relative: 3 %
HCT: 31 % — ABNORMAL LOW (ref 39.0–52.0)
Hemoglobin: 9.4 g/dL — ABNORMAL LOW (ref 13.0–17.0)
Immature Granulocytes: 0 %
Lymphocytes Relative: 21 %
Lymphs Abs: 1.9 10*3/uL (ref 0.7–4.0)
MCH: 24.6 pg — ABNORMAL LOW (ref 26.0–34.0)
MCHC: 30.3 g/dL (ref 30.0–36.0)
MCV: 81.2 fL (ref 80.0–100.0)
Monocytes Absolute: 0.6 10*3/uL (ref 0.1–1.0)
Monocytes Relative: 6 %
Neutro Abs: 6.2 10*3/uL (ref 1.7–7.7)
Neutrophils Relative %: 70 %
Platelet Count: 399 10*3/uL (ref 150–400)
RBC: 3.82 MIL/uL — ABNORMAL LOW (ref 4.22–5.81)
RDW: 14.1 % (ref 11.5–15.5)
WBC Count: 8.9 10*3/uL (ref 4.0–10.5)
nRBC: 0 % (ref 0.0–0.2)

## 2019-09-18 LAB — CMP (CANCER CENTER ONLY)
ALT: 29 U/L (ref 0–44)
AST: 42 U/L — ABNORMAL HIGH (ref 15–41)
Albumin: 3.9 g/dL (ref 3.5–5.0)
Alkaline Phosphatase: 103 U/L (ref 38–126)
Anion gap: 10 (ref 5–15)
BUN: 17 mg/dL (ref 6–20)
CO2: 20 mmol/L — ABNORMAL LOW (ref 22–32)
Calcium: 9.4 mg/dL (ref 8.9–10.3)
Chloride: 108 mmol/L (ref 98–111)
Creatinine: 1.55 mg/dL — ABNORMAL HIGH (ref 0.61–1.24)
GFR, Est AFR Am: 60 mL/min — ABNORMAL LOW (ref >60)
GFR, Estimated: 51 mL/min — ABNORMAL LOW (ref >60)
Glucose, Bld: 101 mg/dL — ABNORMAL HIGH (ref 70–99)
Potassium: 4.2 mmol/L (ref 3.5–5.1)
Sodium: 138 mmol/L (ref 135–145)
Total Bilirubin: 0.3 mg/dL (ref 0.3–1.2)
Total Protein: 7.5 g/dL (ref 6.5–8.1)

## 2019-09-18 LAB — TSH: TSH: 1.179 u[IU]/mL (ref 0.320–4.118)

## 2019-09-18 MED ORDER — SODIUM CHLORIDE 0.9 % IV SOLN
200.0000 mg | Freq: Once | INTRAVENOUS | Status: AC
  Administered 2019-09-18: 200 mg via INTRAVENOUS
  Filled 2019-09-18: qty 8

## 2019-09-18 MED ORDER — SODIUM CHLORIDE 0.9 % IV SOLN
Freq: Once | INTRAVENOUS | Status: AC
  Filled 2019-09-18: qty 250

## 2019-09-18 NOTE — Progress Notes (Signed)
Okay to proceed in setting of pending PA per Darlena. PA team is working on [place].   Demetrius Charity, PharmD, BCPS, [facility] Pharmacist Pharmacy Phone: [phone] 09/18/2019

## 2019-09-18 NOTE — Progress Notes (Signed)
Creatnine today is 1.55 - ok to treat per Dr. Alen Blew.

## 2019-09-18 NOTE — Patient Instructions (Signed)
[facility] Discharge Instructions for Patients Receiving Chemotherapy  Today you received the following chemotherapy agents: Keytruda  To help prevent nausea and vomiting after your treatment, we encourage you to take your nausea medication  as prescribed.    If you develop nausea and vomiting that is not controlled by your nausea medication, call the clinic.   BELOW ARE SYMPTOMS THAT SHOULD BE REPORTED IMMEDIATELY:  *FEVER GREATER THAN 100.5 F  *CHILLS WITH OR WITHOUT FEVER  NAUSEA AND VOMITING THAT IS NOT CONTROLLED WITH YOUR NAUSEA MEDICATION  *UNUSUAL SHORTNESS OF BREATH  *UNUSUAL BRUISING OR BLEEDING  TENDERNESS IN MOUTH AND THROAT WITH OR WITHOUT PRESENCE OF ULCERS  *URINARY PROBLEMS  *BOWEL PROBLEMS  UNUSUAL RASH Items with * indicate a potential emergency and should be followed up as soon as possible.  Feel free to call the clinic should you have any questions or concerns. The clinic phone number is [phone].  Please show the CHEMO ALERT CARD at check-in to the Emergency Department and triage nurse.  Pembrolizumab injection What is this medicine? PEMBROLIZUMAB (pem broe liz ue mab) is a monoclonal antibody. It is used to treat certain types of cancer. This medicine may be used for other purposes; ask your health care provider or pharmacist if you have questions. COMMON BRAND NAME(S): Keytruda What should I tell my health care provider before I take this medicine? They need to know if you have any of these conditions:  diabetes  immune system problems  inflammatory bowel disease  liver disease  lung or breathing disease  lupus  received or scheduled to receive an organ transplant or a stem-cell transplant that uses donor stem cells  an unusual or allergic reaction to pembrolizumab, other medicines, foods, dyes, or preservatives  pregnant or trying to get pregnant  breast-feeding How should I use this medicine? This medicine is  for infusion into a vein. It is given by a health care professional in a hospital or clinic setting. A special MedGuide will be given to you before each treatment. Be sure to read this information carefully each time. Talk to your pediatrician regarding the use of this medicine in children. While this drug may be prescribed for children as young as 6 months for selected conditions, precautions do apply. Overdosage: If you think you have taken too much of this medicine contact a poison control center or emergency room at once. NOTE: This medicine is only for you. Do not share this medicine with others. What if I miss a dose? It is important not to miss your dose. Call your doctor or health care professional if you are unable to keep an appointment. What may interact with this medicine? Interactions have not been studied. Give your health care provider a list of all the medicines, herbs, non-prescription drugs, or dietary supplements you use. Also tell them if you smoke, drink alcohol, or use illegal drugs. Some items may interact with your medicine. This list may not describe all possible interactions. Give your health care provider a list of all the medicines, herbs, non-prescription drugs, or dietary supplements you use. Also tell them if you smoke, drink alcohol, or use illegal drugs. Some items may interact with your medicine. What should I watch for while using this medicine? Your condition will be monitored carefully while you are receiving this medicine. You may need blood work done while you are taking this medicine. Do not become pregnant while taking this medicine or for 4 months after stopping it.   inform their doctor if they wish to become pregnant or think they might be pregnant. There is a potential for serious side effects to an unborn child. Talk to your health care professional or pharmacist for more information. Do not breast-feed an infant while taking this medicine or for 4  months after the last dose. What side effects may I notice from receiving this medicine? Side effects that you should report to your doctor or health care professional as soon as possible:  allergic reactions like skin rash, itching or hives, swelling of the face, lips, or tongue  bloody or black, tarry  breathing problems  changes in vision  chest pain  chills  confusion  constipation  cough  diarrhea  dizziness or feeling faint or lightheaded  fast or irregular heartbeat  fever  flushing  joint pain  low blood counts - this medicine may decrease the number of white blood cells, red blood cells and platelets. You may be at increased risk for infections and bleeding.  muscle pain  muscle weakness  pain, tingling, numbness in the hands or feet  persistent headache  redness, blistering, peeling or loosening of the skin, including inside the mouth  signs and symptoms of high blood sugar such as dizziness; dry mouth; dry skin; fruity breath; nausea; stomach pain; increased hunger or thirst; increased urination  signs and symptoms of kidney injury like trouble passing urine or change in the amount of urine  signs and symptoms of liver injury like dark urine, light-colored stools, loss of appetite, nausea, right upper belly pain, yellowing of the eyes or skin  sweating  swollen lymph nodes  weight loss Side effects that usually do not require medical attention (report to your doctor or health care professional if they continue or are bothersome):  decreased appetite  hair loss  muscle pain  tiredness This list may not describe all possible side effects. Call your doctor for medical advice about side effects. You may report side effects to FDA at 1-800-FDA-1088. Where should I keep my medicine? This drug is given in a hospital or clinic and will not be stored at home. NOTE: This sheet is a summary. It may not cover all possible information. If you have  questions about this medicine, talk to your doctor, pharmacist, or health care provider.  2020 Elsevier/Gold Standard (2019-04-24 18:07:58)  COVID-19 Vaccine Information can be found at: ShippingScam.co.uk For questions related to vaccine distribution or appointments, please email vaccine@[email or web address] .com or call [phone].

## 2019-09-18 NOTE — Progress Notes (Signed)
Met with patient at registration side desk to introduce myself as Arboriculturist and to offer available resources.  Discussed one-time $1000 Radio broadcast assistant to assist with personal expenses while going through treatment.  Also asked about ded/OOP for the year. He states he has met them. Advised there is available copay assistance for Keytruda if he had not that would cover after insurance pays their portion.  Gave him my card if interested in applying and for any additional financial questions or concerns.

## 2019-09-21 ENCOUNTER — Telehealth: Payer: Self-pay | Admitting: *Deleted

## 2019-09-27 ENCOUNTER — Encounter: Payer: Self-pay | Admitting: Emergency Medicine

## 2019-09-28 ENCOUNTER — Ambulatory Visit (INDEPENDENT_AMBULATORY_CARE_PROVIDER_SITE_OTHER): Payer: BC Managed Care – PPO

## 2019-09-28 ENCOUNTER — Encounter: Payer: Self-pay | Admitting: Emergency Medicine

## 2019-09-28 ENCOUNTER — Other Ambulatory Visit: Payer: Self-pay

## 2019-09-28 ENCOUNTER — Ambulatory Visit (INDEPENDENT_AMBULATORY_CARE_PROVIDER_SITE_OTHER): Payer: BC Managed Care – PPO | Admitting: Emergency Medicine

## 2019-09-28 VITALS — BP 154/82 | HR 88 | Temp 97.8°F | Resp 15 | Ht 70.0 in | Wt 234.2 lb

## 2019-09-28 DIAGNOSIS — D649 Anemia, unspecified: Secondary | ICD-10-CM

## 2019-09-28 DIAGNOSIS — I10 Essential (primary) hypertension: Secondary | ICD-10-CM | POA: Diagnosis not present

## 2019-09-28 DIAGNOSIS — R109 Unspecified abdominal pain: Secondary | ICD-10-CM | POA: Diagnosis not present

## 2019-09-28 DIAGNOSIS — R079 Chest pain, unspecified: Secondary | ICD-10-CM | POA: Diagnosis not present

## 2019-09-28 DIAGNOSIS — C642 Malignant neoplasm of left kidney, except renal pelvis: Secondary | ICD-10-CM

## 2019-09-28 LAB — POCT CBC
Granulocyte percent: 70.9 %G (ref 37–80)
HCT, POC: 31.7 % (ref 29–41)
Hemoglobin: 10 g/dL — AB (ref 11–14.6)
Lymph, poc: 1.8 (ref 0.6–3.4)
MCH, POC: 24.3 pg — AB (ref 27–31.2)
MCHC: 31.5 g/dL — AB (ref 31.8–35.4)
MCV: 77.2 fL (ref 76–111)
MID (cbc): 0.4 (ref 0–0.9)
MPV: 7 fL (ref 0–99.8)
POC Granulocyte: 5.2 (ref 2–6.9)
POC LYMPH PERCENT: 24.3 %L (ref 10–50)
POC MID %: 4.8 %M (ref 0–12)
Platelet Count, POC: 370 10*3/uL (ref 142–424)
RBC: 4.11 M/uL — AB (ref 4.69–6.13)
RDW, POC: 14.5 %
WBC: 7.4 10*3/uL (ref 4.6–10.2)

## 2019-09-28 IMAGING — DX DG CHEST 2V
2 series · 2 of 2 positions shown · non-contrast
Comparison: [DATE].

CLINICAL DATA: Chest pain

EXAM:
CHEST - 2 VIEW

[chest pa]
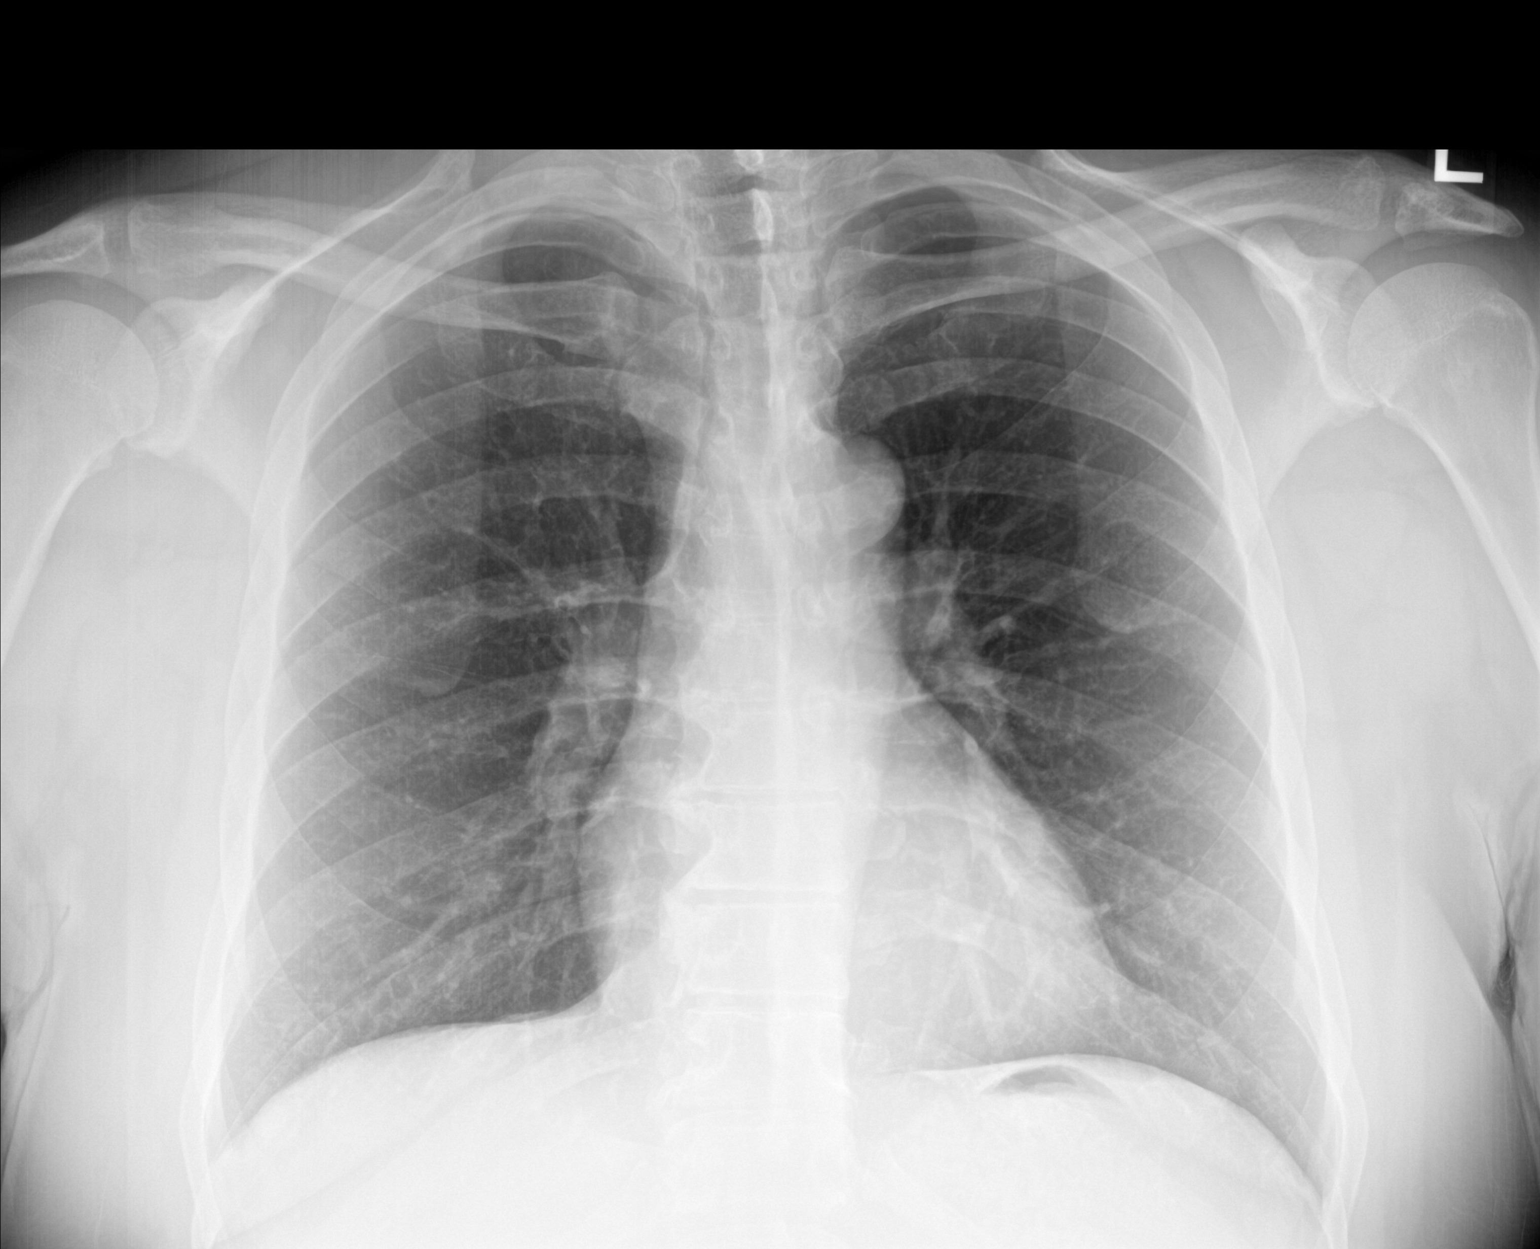

[chest lat]
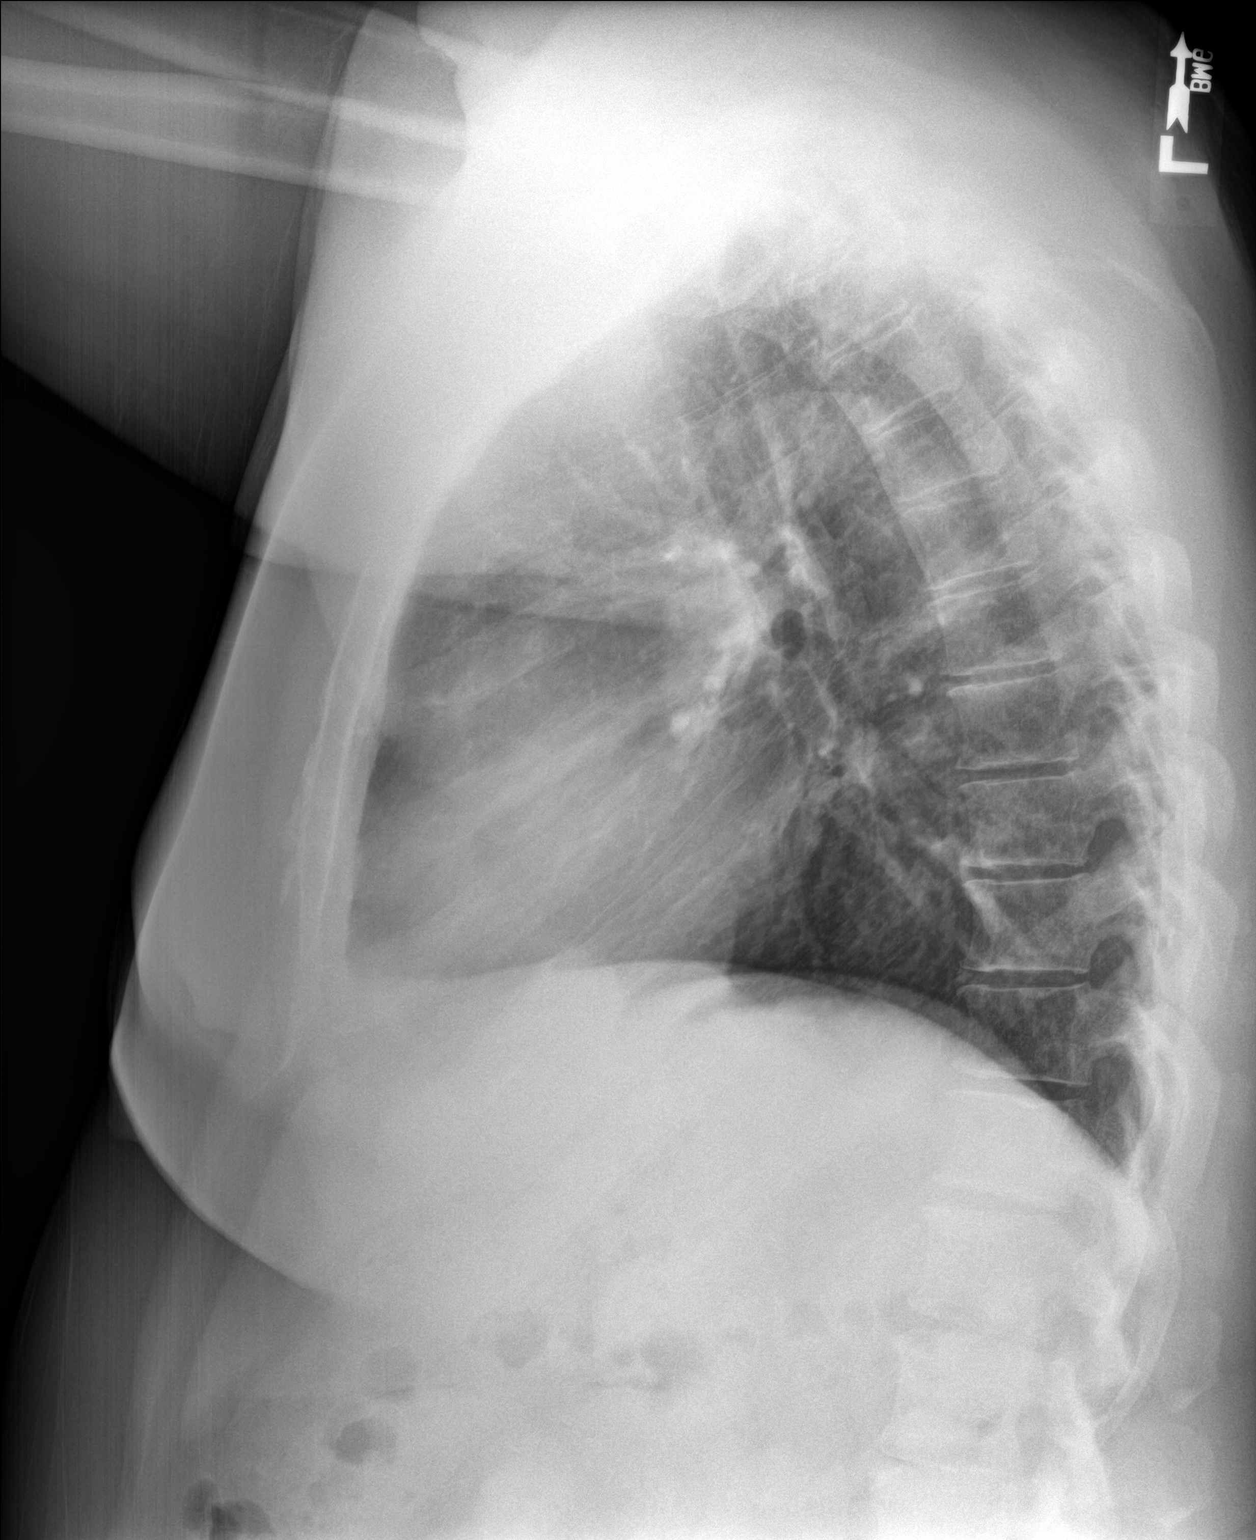

[2 of 2 positions shown; findings below may reference images not displayed]

FINDINGS: Lungs are clear. Heart size and pulmonary vascularity are normal. No
adenopathy. No pneumothorax. No bone lesions.
IMPRESSION: Lungs clear.  Cardiac silhouette within normal limits.

## 2019-09-28 NOTE — Progress Notes (Signed)
Marcus Callahan 49 y.o.   Chief Complaint  Patient presents with  . Pain    LT side/shoulder, had kidney surgery last week and now feels he is having muscle spasms in his side up to his shoulder.    HISTORY OF PRESENT ILLNESS: This is a 49 y.o. male complaining of pain to his left flank area radiating up into left shoulder area and down into left lower abdominal area since yesterday.  Refers to pain as "muscle spasms".  No associated symptoms.  Blood pressure readings at home normal and afebrile. Patient has a history of metastatic left kidney cancer status post nephrectomy last month followed by chemotherapy. Denies urinary symptoms.  Denies fever or chills.  No flulike symptoms.  Moving his bowels okay.  Denies rectal bleeding or melena. Denies difficulty breathing or chest pain.  Denies syncope or lightheadedness.  Denies injury.  HPI   Prior to Admission medications   Medication Sig Start Date End Date Taking? Authorizing Provider  labetalol (NORMODYNE) 100 MG tablet Take 1 tablet (100 mg total) by mouth 2 (two) times daily. 12/09/18 08/13/20  [name], MD  lisinopril-hydrochlorothiazide (ZESTORETIC) 20-25 MG tablet Take 1 tablet by mouth daily. 11/18/18   [name], MD    Allergies  Allergen Reactions  . Codeine Other (See Comments)    Nose bleeds    Patient Active Problem List   Diagnosis Date Noted  . Goals of care, counseling/discussion 09/08/2019  . Kidney cancer, primary, with metastasis from kidney to other site [facility]) 09/08/2019  . Renal mass 08/26/2019  . Uncontrolled hypertension 11/18/2018    Past Medical History:  Diagnosis Date  . Allergy    no diagnosis per pt   . Hypertension     Past Surgical History:  Procedure Laterality Date  . CYSTOSCOPY N/A 08/26/2019   Procedure: Erlene Quan;  Surgeon: Ceasar Mons, MD;  Location: [facility];  Service: Urology;  Laterality: N/A;  . ROBOT ASSISTED LAPAROSCOPIC  NEPHRECTOMY Left 08/26/2019   Procedure: XI ROBOTIC ASSISTED LAPAROSCOPIC NEPHRECTOMY;  Surgeon: Ceasar Mons, MD;  Location: [facility];  Service: Urology;  Laterality: Left;  . surgery as a child     thinks matbe was a hernia repair as a premature baby     Social History   Socioeconomic History  . Marital status: Single    Spouse name: Not on file  . Number of children: Not on file  . Years of education: Not on file  . Highest education level: Not on file  Occupational History  . Not on file  Tobacco Use  . Smoking status: Never Smoker  . Smokeless tobacco: Never Used  Substance and Sexual Activity  . Alcohol use: Yes    Comment: occassionaly  . Drug use: Never  . Sexual activity: Not on file  Other Topics Concern  . Not on file  Social History Narrative  . Not on file   Social Determinants of Health   Financial Resource Strain:   . Difficulty of Paying Living Expenses:   Food Insecurity:   . Worried About Charity fundraiser in the Last Year:   . Arboriculturist in the Last Year:   Transportation Needs:   . Film/video editor (Medical):   Marland Kitchen Lack of Transportation (Non-Medical):   Physical Activity:   . Days of Exercise per Week:   . Minutes of Exercise per Session:   Stress:   . Feeling of Stress :  Social Connections:   . Frequency of Communication with Friends and Family:   . Frequency of Social Gatherings with Friends and Family:   . Attends Religious Services:   . Active Member of Clubs or Organizations:   . Attends Archivist Meetings:   Marland Kitchen Marital Status:   Intimate Partner Violence:   . Fear of Current or Ex-Partner:   . Emotionally Abused:   Marland Kitchen Physically Abused:   . Sexually Abused:     Family History  Problem Relation Age of Onset  . Colon polyps Mother   . Colon cancer Neg Hx   . Esophageal cancer Neg Hx   . Prostate cancer Neg Hx   . Rectal cancer Neg Hx      Review of Systems  Constitutional: Negative.  Negative  for chills and fever.  HENT: Negative.  Negative for congestion and sore throat.   Respiratory: Negative.  Negative for cough and shortness of breath.   Cardiovascular: Negative.  Negative for chest pain and palpitations.  Gastrointestinal: Negative.  Negative for abdominal pain, blood in stool, diarrhea, melena, nausea and vomiting.  Genitourinary: Negative.  Negative for dysuria and hematuria.  Musculoskeletal: Positive for back pain. Negative for myalgias and neck pain.  Skin: Negative.  Negative for rash.  Neurological: Negative.  Negative for dizziness and headaches.  All other systems reviewed and are negative.  Today's Vitals   09/28/19 1431 09/28/19 1435  BP: (!) 173/109 (!) 154/82  Pulse: 88   Resp: 15   Temp: 97.8 F (36.6 C)   TempSrc: Temporal   SpO2: 99%   Weight: 234 lb 3.2 oz (106.2 kg)   Height: 5\' 10"  (1.778 m)    Body mass index is 33.6 kg/m.   Physical Exam Vitals reviewed.  Constitutional:      Appearance: Normal appearance.  HENT:     Head: Normocephalic.  Eyes:     Extraocular Movements: Extraocular movements intact.     Conjunctiva/sclera: Conjunctivae normal.     Pupils: Pupils are equal, round, and reactive to light.  Neck:     Vascular: No carotid bruit.  Cardiovascular:     Rate and Rhythm: Normal rate and regular rhythm.     Pulses: Normal pulses.     Heart sounds: Normal heart sounds.  Pulmonary:     Effort: Pulmonary effort is normal.     Breath sounds: Normal breath sounds.  Abdominal:     General: Bowel sounds are normal. There is no distension.     Palpations: Abdomen is soft.     Tenderness: There is no abdominal tenderness.  Musculoskeletal:        General: Normal range of motion.     Cervical back: Normal range of motion and neck supple.  Lymphadenopathy:     Cervical: No cervical adenopathy.  Skin:    General: Skin is warm and dry.     Capillary Refill: Capillary refill takes less than 2 seconds.  Neurological:      General: No focal deficit present.     Mental Status: He is alert and oriented to person, place, and time.  Psychiatric:        Mood and Affect: Mood normal.        Behavior: Behavior normal.    EKG: Normal sinus rhythm with ventricular rate of 83/min.  No acute ischemic changes.  Good voltage on EKG.   Results for orders placed or performed in visit on 09/28/19 (from the past 24 hour(s))  POCT  CBC     Status: Abnormal   Collection Time: 09/28/19  3:22 PM  Result Value Ref Range   WBC 7.4 4.6 - 10.2 K/uL   Lymph, poc 1.8 0.6 - 3.4   POC LYMPH PERCENT 24.3 10 - 50 %L   MID (cbc) 0.4 0 - 0.9   POC MID % 4.8 0 - 12 %M   POC Granulocyte 5.2 2 - 6.9   Granulocyte percent 70.9 37 - 80 %G   RBC 4.11 (A) 4.69 - 6.13 M/uL   Hemoglobin 10.0 (A) 11 - 14.6 g/dL   HCT, POC 31.7 29 - 41 %   MCV 77.2 76 - 111 fL   MCH, POC 24.3 (A) 27 - 31.2 pg   MCHC 31.5 (A) 31.8 - 35.4 g/dL   RDW, POC 14.5 %   Platelet Count, POC 370 142 - 424 K/uL   MPV 7.0 0 - 99.8 fL   DG Chest 2 View  Result Date: 09/28/2019 CLINICAL DATA:  Chest pain EXAM: CHEST - 2 VIEW COMPARISON:  August 14, 2019. FINDINGS: Lungs are clear. Heart size and pulmonary vascularity are normal. No adenopathy. No pneumothorax. No bone lesions. IMPRESSION: Lungs clear.  Cardiac silhouette within normal limits. Electronically Signed   By: Lowella Grip III M.D.   On: 09/28/2019 15:25   A total of 45 minutes was spent with the patient, greater than 50% of which was in counseling/coordination of care regarding differential diagnosis, review of most recent office visit notes, review of most recent blood results including today's CBC, review of today's chest x-ray, review of today's EKG, review of all medications, ED precautions, prognosis and need for follow-up if no better or worse in the next several days.  ASSESSMENT & PLAN: Clinically stable.  No red flag signs or symptoms.  No medical concerns identified during this visit.  However  ED precautions given.  Advised to contact the office if clinical picture changes or symptoms get worse in the next several days.  Marcus Callahan was seen today for pain.  Diagnoses and all orders for this visit:  Left flank pain -     Comprehensive metabolic panel -     POCT CBC -     EKG 12-Lead -     DG Chest 2 View  Primary malignant neoplasm of left kidney with metastasis from kidney to other site [facility])  Essential hypertension  Anemia, unspecified type    Patient Instructions       If you have lab work done today you will be contacted with your lab results within the next 2 weeks.  If you have not heard from [place] then please contact us. The fastest way to get your results is to register for My Chart.   IF you received an x-ray today, you will receive an invoice from [facility]. Please contact [facility] at [phone] with questions or concerns regarding your invoice.   IF you received labwork today, you will receive an invoice from [facility]. Please contact [facility] at [phone] with questions or concerns regarding your invoice.   Our billing staff will not be able to assist you with questions regarding bills from these companies.  You will be contacted with the lab results as soon as they are available. The fastest way to get your results is to activate your My Chart account. Instructions are located on the last page of this paperwork. If you have not heard from [place] regarding the results in 2 weeks, please contact this  office.     Flank Pain, Adult Flank pain is pain in your side. The flank is the area of your side between your upper belly (abdomen) and your back. The pain may occur over a short time (acute), or it may be long-term or come back often (chronic). It may be mild or very bad. Pain in this area can be caused by many different things. Follow these instructions at home:   Drink enough fluid to keep your pee (urine) clear or pale yellow.  Rest  as told by your doctor.  Take over-the-counter and prescription medicines only as told by your doctor.  Keep a journal to keep track of: ? What has caused your flank pain. ? What has made it feel better.  Keep all follow-up visits as told by your doctor. This is important. Contact a doctor if:  Medicine does not help your pain.  You have new symptoms.  Your pain gets worse.  You have a fever.  Your symptoms last longer than 2-3 days.  You have trouble peeing.  You are peeing more often than normal. Get help right away if:  You have trouble breathing.  You are short of breath.  Your belly hurts, or it is swollen or red.  You feel sick to your stomach (nauseous).  You throw up (vomit).  You feel like you will pass out, or you do pass out (faint).  You have blood in your pee. Summary  Flank pain is pain in your side. The flank is the area of your side between your upper belly (abdomen) and your back.  Flank pain may occur over a short time (acute), or it may be long-term or come back often (chronic). It may be mild or very bad.  Pain in this area can be caused by many different things.  Contact your doctor if your symptoms get worse or they last longer than 2-3 days. This information is not intended to replace advice given to you by your health care provider. Make sure you discuss any questions you have with your health care provider. Document Revised: 05/31/2017 Document Reviewed: 10/08/2016 Elsevier Patient Education  2020 Elsevier Inc.     Agustina Caroli, MD [facility]

## 2019-09-28 NOTE — Patient Instructions (Addendum)
     If you have lab work done today you will be contacted with your lab results within the next 2 weeks.  If you have not heard from us then please contact us. The fastest way to get your results is to register for My Chart.   IF you received an x-ray today, you will receive an invoice from [facility]. Please contact [facility] at [phone] with questions or concerns regarding your invoice.   IF you received labwork today, you will receive an invoice from [facility]. Please contact [facility] at [phone] with questions or concerns regarding your invoice.   Our billing staff will not be able to assist you with questions regarding bills from these companies.  You will be contacted with the lab results as soon as they are available. The fastest way to get your results is to activate your My Chart account. Instructions are located on the last page of this paperwork. If you have not heard from us regarding the results in 2 weeks, please contact this office.     Flank Pain, Adult Flank pain is pain in your side. The flank is the area of your side between your upper belly (abdomen) and your back. The pain may occur over a short time (acute), or it may be long-term or come back often (chronic). It may be mild or very bad. Pain in this area can be caused by many different things. Follow these instructions at home:   Drink enough fluid to keep your pee (urine) clear or pale yellow.  Rest as told by your doctor.  Take over-the-counter and prescription medicines only as told by your doctor.  Keep a journal to keep track of: ? What has caused your flank pain. ? What has made it feel better.  Keep all follow-up visits as told by your doctor. This is important. Contact a doctor if:  Medicine does not help your pain.  You have new symptoms.  Your pain gets worse.  You have a fever.  Your symptoms last longer than 2-3 days.  You have trouble peeing.  You are  peeing more often than normal. Get help right away if:  You have trouble breathing.  You are short of breath.  Your belly hurts, or it is swollen or red.  You feel sick to your stomach (nauseous).  You throw up (vomit).  You feel like you will pass out, or you do pass out (faint).  You have blood in your pee. Summary  Flank pain is pain in your side. The flank is the area of your side between your upper belly (abdomen) and your back.  Flank pain may occur over a short time (acute), or it may be long-term or come back often (chronic). It may be mild or very bad.  Pain in this area can be caused by many different things.  Contact your doctor if your symptoms get worse or they last longer than 2-3 days. This information is not intended to replace advice given to you by your health care provider. Make sure you discuss any questions you have with your health care provider. Document Revised: 05/31/2017 Document Reviewed: 10/08/2016 Elsevier Patient Education  2020 Elsevier Inc.  

## 2019-09-29 LAB — COMPREHENSIVE METABOLIC PANEL
ALT: 32 IU/L (ref 0–44)
AST: 51 IU/L — ABNORMAL HIGH (ref 0–40)
Albumin/Globulin Ratio: 1.9 (ref 1.2–2.2)
Albumin: 4.6 g/dL (ref 4.0–5.0)
Alkaline Phosphatase: 111 IU/L (ref 39–117)
BUN/Creatinine Ratio: 14 (ref 9–20)
BUN: 22 mg/dL (ref 6–24)
Bilirubin Total: 0.2 mg/dL (ref 0.0–1.2)
CO2: 18 mmol/L — ABNORMAL LOW (ref 20–29)
Calcium: 9.6 mg/dL (ref 8.7–10.2)
Chloride: 107 mmol/L — ABNORMAL HIGH (ref 96–106)
Creatinine, Ser: 1.53 mg/dL — ABNORMAL HIGH (ref 0.76–1.27)
GFR calc Af Amer: 60 mL/min/{1.73_m2} (ref >59)
GFR calc non Af Amer: 52 mL/min/{1.73_m2} — ABNORMAL LOW (ref >59)
Globulin, Total: 2.4 g/dL (ref 1.5–4.5)
Glucose: 156 mg/dL — ABNORMAL HIGH (ref 65–99)
Potassium: 4 mmol/L (ref 3.5–5.2)
Sodium: 141 mmol/L (ref 134–144)
Total Protein: 7 g/dL (ref 6.0–8.5)

## 2019-10-05 ENCOUNTER — Other Ambulatory Visit: Payer: Self-pay | Admitting: Oncology

## 2019-10-05 DIAGNOSIS — C642 Malignant neoplasm of left kidney, except renal pelvis: Secondary | ICD-10-CM

## 2019-10-05 NOTE — Progress Notes (Signed)
Pharmacist Chemotherapy Monitoring - Follow Up Assessment    I verify that I have reviewed each item in the below checklist:  . Regimen for the patient is scheduled for the appropriate day and plan matches scheduled date. Marland Kitchen Appropriate non-routine labs are ordered dependent on drug ordered. . If applicable, additional medications reviewed and ordered per protocol based on lifetime cumulative doses and/or treatment regimen.   Plan for follow-up and/or issues identified: No . I-vent associated with next due treatment: No . MD and/or nursing notified: No  Tessy Pawelski D 10/05/2019 4:26 PM

## 2019-10-09 ENCOUNTER — Inpatient Hospital Stay (HOSPITAL_BASED_OUTPATIENT_CLINIC_OR_DEPARTMENT_OTHER): Payer: BC Managed Care – PPO | Admitting: Oncology

## 2019-10-09 ENCOUNTER — Other Ambulatory Visit: Payer: Self-pay

## 2019-10-09 ENCOUNTER — Inpatient Hospital Stay: Payer: BC Managed Care – PPO | Attending: Oncology

## 2019-10-09 ENCOUNTER — Telehealth: Payer: Self-pay | Admitting: Oncology

## 2019-10-09 ENCOUNTER — Inpatient Hospital Stay: Payer: BC Managed Care – PPO

## 2019-10-09 VITALS — BP 182/118 | HR 73 | Temp 98.2°F | Resp 20 | Ht 70.0 in | Wt 235.8 lb

## 2019-10-09 VITALS — BP 174/112

## 2019-10-09 DIAGNOSIS — Z905 Acquired absence of kidney: Secondary | ICD-10-CM | POA: Diagnosis not present

## 2019-10-09 DIAGNOSIS — I10 Essential (primary) hypertension: Secondary | ICD-10-CM | POA: Insufficient documentation

## 2019-10-09 DIAGNOSIS — C642 Malignant neoplasm of left kidney, except renal pelvis: Secondary | ICD-10-CM

## 2019-10-09 DIAGNOSIS — Z79899 Other long term (current) drug therapy: Secondary | ICD-10-CM | POA: Diagnosis not present

## 2019-10-09 DIAGNOSIS — C787 Secondary malignant neoplasm of liver and intrahepatic bile duct: Secondary | ICD-10-CM | POA: Insufficient documentation

## 2019-10-09 DIAGNOSIS — Z5112 Encounter for antineoplastic immunotherapy: Secondary | ICD-10-CM | POA: Insufficient documentation

## 2019-10-09 LAB — CBC WITH DIFFERENTIAL (CANCER CENTER ONLY)
Abs Immature Granulocytes: 0.01 10*3/uL (ref 0.00–0.07)
Basophils Absolute: 0 10*3/uL (ref 0.0–0.1)
Basophils Relative: 1 %
Eosinophils Absolute: 0.2 10*3/uL (ref 0.0–0.5)
Eosinophils Relative: 4 %
HCT: 34.4 % — ABNORMAL LOW (ref 39.0–52.0)
Hemoglobin: 10.7 g/dL — ABNORMAL LOW (ref 13.0–17.0)
Immature Granulocytes: 0 %
Lymphocytes Relative: 31 %
Lymphs Abs: 1.9 10*3/uL (ref 0.7–4.0)
MCH: 24.4 pg — ABNORMAL LOW (ref 26.0–34.0)
MCHC: 31.1 g/dL (ref 30.0–36.0)
MCV: 78.5 fL — ABNORMAL LOW (ref 80.0–100.0)
Monocytes Absolute: 0.4 10*3/uL (ref 0.1–1.0)
Monocytes Relative: 7 %
Neutro Abs: 3.6 10*3/uL (ref 1.7–7.7)
Neutrophils Relative %: 57 %
Platelet Count: 390 10*3/uL (ref 150–400)
RBC: 4.38 MIL/uL (ref 4.22–5.81)
RDW: 15 % (ref 11.5–15.5)
WBC Count: 6.1 10*3/uL (ref 4.0–10.5)
nRBC: 0 % (ref 0.0–0.2)

## 2019-10-09 LAB — CMP (CANCER CENTER ONLY)
ALT: 32 U/L (ref 0–44)
AST: 31 U/L (ref 15–41)
Albumin: 4 g/dL (ref 3.5–5.0)
Alkaline Phosphatase: 99 U/L (ref 38–126)
Anion gap: 11 (ref 5–15)
BUN: 15 mg/dL (ref 6–20)
CO2: 21 mmol/L — ABNORMAL LOW (ref 22–32)
Calcium: 9.3 mg/dL (ref 8.9–10.3)
Chloride: 107 mmol/L (ref 98–111)
Creatinine: 1.53 mg/dL — ABNORMAL HIGH (ref 0.61–1.24)
GFR, Est AFR Am: >60 mL/min (ref >60)
GFR, Estimated: 52 mL/min — ABNORMAL LOW (ref >60)
Glucose, Bld: 97 mg/dL (ref 70–99)
Potassium: 4.6 mmol/L (ref 3.5–5.1)
Sodium: 139 mmol/L (ref 135–145)
Total Bilirubin: 0.3 mg/dL (ref 0.3–1.2)
Total Protein: 7.4 g/dL (ref 6.5–8.1)

## 2019-10-09 LAB — TSH: TSH: 1.683 u[IU]/mL (ref 0.320–4.118)

## 2019-10-09 MED ORDER — SODIUM CHLORIDE 0.9 % IV SOLN
200.0000 mg | Freq: Once | INTRAVENOUS | Status: AC
  Administered 2019-10-09: 14:00:00 200 mg via INTRAVENOUS
  Filled 2019-10-09: qty 8

## 2019-10-09 MED ORDER — SODIUM CHLORIDE 0.9 % IV SOLN
Freq: Once | INTRAVENOUS | Status: AC
  Filled 2019-10-09: qty 250

## 2019-10-09 NOTE — Patient Instructions (Signed)
[facility] Discharge Instructions for Patients Receiving Chemotherapy  Today you received the following chemotherapy agents:  Keytruda.  To help prevent nausea and vomiting after your treatment, we encourage you to take your nausea medication as directed.   If you develop nausea and vomiting that is not controlled by your nausea medication, call the clinic.   BELOW ARE SYMPTOMS THAT SHOULD BE REPORTED IMMEDIATELY:  *FEVER GREATER THAN 100.5 F  *CHILLS WITH OR WITHOUT FEVER  NAUSEA AND VOMITING THAT IS NOT CONTROLLED WITH YOUR NAUSEA MEDICATION  *UNUSUAL SHORTNESS OF BREATH  *UNUSUAL BRUISING OR BLEEDING  TENDERNESS IN MOUTH AND THROAT WITH OR WITHOUT PRESENCE OF ULCERS  *URINARY PROBLEMS  *BOWEL PROBLEMS  UNUSUAL RASH Items with * indicate a potential emergency and should be followed up as soon as possible.  Feel free to call the clinic should you have any questions or concerns. The clinic phone number is [phone].  Please show the CHEMO ALERT CARD at check-in to the Emergency Department and triage nurse.    

## 2019-10-09 NOTE — Progress Notes (Signed)
Per Dr. Alen Blew, [place] to proceed with [facility] with elevated BP and serum creatinine 1.53.

## 2019-10-09 NOTE — Telephone Encounter (Signed)
Scheduled appt per 4/9 los. °

## 2019-10-09 NOTE — Progress Notes (Signed)
Hematology and Oncology Follow Up Visit  Marcus Callahan IN:[id number] 1970-10-10 49 y.o. 10/09/2019 12:12 PM [place], [place], [place], [place]   Principle Diagnosis: 49 year old man with kidney cancer noted in February 2021.  He was found to have stage IV clear-cell with hepatic metastasis.   Prior Therapy:  He is status post robotic assisted laparoscopic left radical nephrectomy completed on August 26, 2019.  Final pathology indicated T3a tumor with invasion into the perirenal soft tissue, renal pelvis and segmental renal vein and renal sinus.  Current therapy: Axitinib 5 mg twice a day with Pembrolizumab 200 mg every 3 weeks started on September 18, 2019.  Interim History:  Marcus Callahan returns today for a follow-up visit.  Since the last visit, he received the first cycle of Pembrolizumab and started axitinib without any major complications.  He denies nausea, vomiting or abdominal pain.  Denies any weight loss or appetite changes.  He denies any respiratory complaints or changes in bowel habits.  Performance status and quality of life remain excellent.  He denies any decline in ability to perform work-related duties.     Medications: Updated on review. Current Outpatient Medications  Medication Sig Dispense Refill  . INLYTA 5 MG tablet TAKE 1 TABLET (5 MG TOTAL) BY MOUTH 2 (TWO) TIMES DAILY. 30 tablet 0  . labetalol (NORMODYNE) 100 MG tablet Take 1 tablet (100 mg total) by mouth 2 (two) times daily. 180 tablet 3  . lisinopril-hydrochlorothiazide (ZESTORETIC) 20-25 MG tablet Take 1 tablet by mouth daily. 90 tablet 3   No current facility-administered medications for this visit.     Allergies:  Allergies  Allergen Reactions  . Codeine Other (See Comments)    Nose bleeds      Physical Exam: Blood pressure (!) 182/118, pulse 73, temperature 98.2 F (36.8 C), temperature source Temporal, resp. rate 20, height 5\' 10"  (1.778 m), weight 235 lb 12.8 oz (107 kg),  SpO2 100 %.   ECOG: 0    General appearance: Alert, awake without any distress. Head: Atraumatic without abnormalities Oropharynx: Without any thrush or ulcers. Eyes: No scleral icterus. Lymph nodes: No lymphadenopathy noted in the cervical, supraclavicular, or axillary nodes Heart:regular rate and rhythm, without any murmurs or gallops.   Lung: Clear to auscultation without any rhonchi, wheezes or dullness to percussion. Abdomin: Soft, nontender without any shifting dullness or ascites. Musculoskeletal: No clubbing or cyanosis. Neurological: No motor or sensory deficits. Skin: No rashes or lesions.     Lab Results: Lab Results  Component Value Date   WBC 7.4 09/28/2019   HGB 10.0 (A) 09/28/2019   HCT 31.7 09/28/2019   MCV 77.2 09/28/2019   PLT 399 09/18/2019     Chemistry      Component Value Date/Time   NA 141 09/28/2019 1517   K 4.0 09/28/2019 1517   CL 107 (H) 09/28/2019 1517   CO2 18 (L) 09/28/2019 1517   BUN 22 09/28/2019 1517   CREATININE 1.53 (H) 09/28/2019 1517   CREATININE 1.55 (H) 09/18/2019 0847      Component Value Date/Time   CALCIUM 9.6 09/28/2019 1517   ALKPHOS 111 09/28/2019 1517   AST 51 (H) 09/28/2019 1517   AST 42 (H) 09/18/2019 0847   ALT 32 09/28/2019 1517   ALT 29 09/18/2019 0847   BILITOT 0.2 09/28/2019 1517   BILITOT 0.3 09/18/2019 0847          Impression and Plan:  49 year old man with:  1.  Kidney cancer presented  with stage IV disease and hepatic metastasis documented in February 2021.  He was found to have T3a clear-cell histology at the time of nephrectomy.   He is currently receiving therapy with Pembrolizumab and axitinib without any major complications.  Risks and benefits of continuing this treatment were reviewed.  Potential complications were reiterated including hypertension, GI toxicity as well as immune mediated complications.  Alternative options include combination of immunotherapy is a salvage option.  Repeat  imaging studies will be done after completing cycle 4 of therapy.  He is agreeable to proceed at this time.  2.  Hypertension: His blood pressure is elevated today but blood pressure in between visits has been within normal range.  Manual blood pressure measurements also indicate normal blood pressure.  3.  Immune mediated complications.  I continue to educate him about potential complications including pneumonitis, colitis, thyroid disease..  4.  Nausea prophylaxis: No nausea or vomiting reported at this time.  Antiemetics are available to him.  5.  IV access: Port-A-Cath option has been deferred for the time being and peripheral veins are currently in use.  6.  Follow-up: In 3 weeks for the next cycle of therapy.  30 minutes were dedicated to this encounter.  The time was spent on reviewing his disease status, addressing complications noted therapy as well as future plan of care discussion.    Zola Button, MD 4/9/202112:12 PM

## 2019-10-12 ENCOUNTER — Telehealth: Payer: Self-pay | Admitting: Pharmacist

## 2019-10-12 DIAGNOSIS — C642 Malignant neoplasm of left kidney, except renal pelvis: Secondary | ICD-10-CM

## 2019-10-12 MED ORDER — AXITINIB 5 MG PO TABS: 5.0000 mg | ORAL_TABLET | Freq: Two times a day (BID) | ORAL | 0 refills | Status: DC

## 2019-10-12 NOTE — Telephone Encounter (Signed)
Oral Chemotherapy Pharmacist Encounter   Received call from Mr. Glendenning that when he called [facility] for his refill they told him he now had to fill at [facility] due to insurance restrictions. Prescription redirected to [facility].  Darl Pikes, PharmD, BCPS, BCOP, CPP Hematology/Oncology Clinical Pharmacist [facility]/HP/AP [facility] [phone]  10/12/2019 9:28 AM

## 2019-10-13 ENCOUNTER — Encounter: Payer: Self-pay | Admitting: Oncology

## 2019-10-13 ENCOUNTER — Encounter: Payer: Self-pay | Admitting: Emergency Medicine

## 2019-10-13 DIAGNOSIS — C642 Malignant neoplasm of left kidney, except renal pelvis: Secondary | ICD-10-CM | POA: Diagnosis not present

## 2019-10-14 ENCOUNTER — Ambulatory Visit (INDEPENDENT_AMBULATORY_CARE_PROVIDER_SITE_OTHER): Payer: BC Managed Care – PPO | Admitting: Adult Health Nurse Practitioner

## 2019-10-14 ENCOUNTER — Other Ambulatory Visit: Payer: Self-pay

## 2019-10-14 ENCOUNTER — Encounter: Payer: Self-pay | Admitting: Adult Health Nurse Practitioner

## 2019-10-14 VITALS — BP 198/115 | HR 77 | Temp 98.0°F | Ht 70.0 in | Wt 237.4 lb

## 2019-10-14 DIAGNOSIS — I10 Essential (primary) hypertension: Secondary | ICD-10-CM | POA: Diagnosis not present

## 2019-10-14 MED ORDER — METOPROLOL SUCCINATE ER 100 MG PO TB24: 100.0000 mg | ORAL_TABLET | Freq: Every day | ORAL | 1 refills | Status: DC

## 2019-10-14 NOTE — Patient Instructions (Signed)
° ° ° °  If you have lab work done today you will be contacted with your lab results within the next 2 weeks.  If you have not heard from us then please contact us. The fastest way to get your results is to register for My Chart. ° ° °IF you received an x-ray today, you will receive an invoice from [facility]. Please contact  Radiology at [phone] with questions or concerns regarding your invoice.  ° °IF you received labwork today, you will receive an invoice from [facility]. Please contact [facility] at [phone] with questions or concerns regarding your invoice.  ° °Our billing staff will not be able to assist you with questions regarding bills from these companies. ° °You will be contacted with the lab results as soon as they are available. The fastest way to get your results is to activate your My Chart account. Instructions are located on the last page of this paperwork. If you have not heard from us regarding the results in 2 weeks, please contact this office. °  ° ° ° °

## 2019-10-14 NOTE — Progress Notes (Signed)
   10/14/2019  [name] 04/26/1971 [id number]   Subjective:  Marcus Callahan is a 49 y.o. male with hypertension.  Patient presents for hypertensive urgency.  And hypertension prior to renal cell carcinoma being diagnosed in January.  Had a nephrectomy on the left.  Currently, under treatment with Inlyta.  CT scan after the 21st.  Blood pressure has increased over the past 1 to 2 weeks.  He is taking at home where it is usually 123456 systolic over [id number] diastolic.  Today's blood pressures are very high.  Initial blood pressure is 198/115 and a second blood pressure is 190/100.  Patient denies any symptoms of headache, blurry vision, chest pain, chest pressure, shortness of breath, radiating jaw pain, or DOE.   Current Outpatient Medications  Medication Sig Dispense Refill  . axitinib (INLYTA) 5 MG tablet Take 1 tablet (5 mg total) by mouth 2 (two) times daily. 60 tablet 0  . labetalol (NORMODYNE) 100 MG tablet Take 1 tablet (100 mg total) by mouth 2 (two) times daily. 180 tablet 3  . lisinopril-hydrochlorothiazide (ZESTORETIC) 20-25 MG tablet Take 1 tablet by mouth daily. 90 tablet 3  . metoprolol succinate (TOPROL-XL) 100 MG 24 hr tablet Take 1 tablet (100 mg total) by mouth daily. Take with or immediately following a meal. 30 tablet 1   No current facility-administered medications for this visit.    Hypertension ROS: taking medications as instructed, no medication side effects noted, no TIA's, no chest pain on exertion, no dyspnea on exertion and no swelling of ankles.  New concerns: Elevated BP.   Objective:  BP (!) 198/115   Pulse 77   Temp 98 F (36.7 C) (Temporal)   Ht 5\' 10"  (1.778 m)   Wt 237 lb 6.4 oz (107.7 kg)   SpO2 97%   BMI 34.06 kg/m   Appearance alert, well appearing, and in no distress. General exam BP noted to be well controlled today in office, S1, S2 normal, no gallop, no murmur, chest clear, no JVD, no HSM, no edema.  Lab review: labs are reviewed,  up to date and normal.   Assessment:   Hypertension asymptomatic and poorly controlled.   Plan:  Reviewed medications and side effects in detail. Reviewed potential future medication changes and side effects. Follow up: 2 weeks and as needed..  D/C Labetalol. Start the below medication.   Meds ordered this encounter  Medications  . metoprolol succinate (TOPROL-XL) 100 MG 24 hr tablet    Sig: Take 1 tablet (100 mg total) by mouth daily. Take with or immediately following a meal.    Dispense:  30 tablet    Refill:  1   Precautions discussed.  Will f/u in 2 weeks to evaluate effectiveness and if, at that time, still uncontrolled, would add Amlodipine.  He is ilnine with this plan.   Glyn Ade, NP

## 2019-10-15 ENCOUNTER — Other Ambulatory Visit: Payer: Self-pay

## 2019-10-15 ENCOUNTER — Encounter: Payer: Self-pay | Admitting: Oncology

## 2019-10-26 NOTE — Progress Notes (Signed)

## 2019-10-28 ENCOUNTER — Ambulatory Visit (INDEPENDENT_AMBULATORY_CARE_PROVIDER_SITE_OTHER): Payer: BC Managed Care – PPO | Admitting: Adult Health Nurse Practitioner

## 2019-10-28 ENCOUNTER — Other Ambulatory Visit: Payer: Self-pay

## 2019-10-28 DIAGNOSIS — I10 Essential (primary) hypertension: Secondary | ICD-10-CM

## 2019-10-28 MED ORDER — AMLODIPINE BESYLATE 10 MG PO TABS: 10.0000 mg | ORAL_TABLET | Freq: Every day | ORAL | 1 refills | Status: DC

## 2019-10-28 MED ORDER — LISINOPRIL-HYDROCHLOROTHIAZIDE 20-25 MG PO TABS: 1.0000 | ORAL_TABLET | Freq: Every day | ORAL | 3 refills | Status: DC

## 2019-10-29 ENCOUNTER — Encounter: Payer: Self-pay | Admitting: Oncology

## 2019-10-29 NOTE — Progress Notes (Signed)
   10/29/2019  [name] May 20, 1971 [id number]  Subjective:  Marcus Callahan is a 49 y.o. male with hypertension.  Blood pressure has been coming down but based on home readings averaging around 123456 over 0000000 systolic, still not at goal.  BP was never elevated until recent oncology Rx Inlyta was started.   Current Outpatient Medications  Medication Sig Dispense Refill  . axitinib (INLYTA) 5 MG tablet Take 1 tablet (5 mg total) by mouth 2 (two) times daily. 60 tablet 0  . lisinopril-hydrochlorothiazide (ZESTORETIC) 20-25 MG tablet Take 1 tablet by mouth daily. 90 tablet 3  . metoprolol succinate (TOPROL-XL) 100 MG 24 hr tablet Take 1 tablet (100 mg total) by mouth daily. Take with or immediately following a meal. 30 tablet 1  . amLODipine (NORVASC) 10 MG tablet Take 1 tablet (10 mg total) by mouth daily. 30 tablet 1   No current facility-administered medications for this visit.    Hypertension ROS: taking medications as instructed, no medication side effects noted, no TIA's, no chest pain on exertion, no dyspnea on exertion and no swelling of ankles.  .   Objective:  BP (!) 144/102   Pulse 70   Temp 97.9 F (36.6 C) (Temporal)   Resp 16   Ht 5\' 10"  (1.778 m)   Wt 234 lb 9.6 oz (106.4 kg)   SpO2 98%   BMI 33.66 kg/m   Appearance alert, well appearing, and in no distress. General exam BP noted to be well controlled today in office, S1, S2 normal, no gallop, no murmur, chest clear, no JVD, no HSM, no edema.  Lab review: labs are reviewed, up to date and normal.   Assessment:   Hypertension needs improvement.   Plan:  Orders and follow up as documented in patient record. Reviewed medications and side effects in detail. Follow up: 2 weeks and as needed..  Meds ordered this encounter  Medications  . lisinopril-hydrochlorothiazide (ZESTORETIC) 20-25 MG tablet    Sig: Take 1 tablet by mouth daily.    Dispense:  90 tablet    Refill:  3  . amLODipine (NORVASC) 10  MG tablet    Sig: Take 1 tablet (10 mg total) by mouth daily.    Dispense:  30 tablet    Refill:  1

## 2019-10-30 ENCOUNTER — Inpatient Hospital Stay: Payer: BC Managed Care – PPO

## 2019-10-30 ENCOUNTER — Inpatient Hospital Stay (HOSPITAL_BASED_OUTPATIENT_CLINIC_OR_DEPARTMENT_OTHER): Payer: BC Managed Care – PPO | Admitting: Oncology

## 2019-10-30 ENCOUNTER — Other Ambulatory Visit: Payer: Self-pay

## 2019-10-30 ENCOUNTER — Telehealth: Payer: Self-pay | Admitting: Oncology

## 2019-10-30 VITALS — BP 154/106 | HR 83 | Temp 97.8°F | Resp 20 | Ht 70.0 in | Wt 234.8 lb

## 2019-10-30 DIAGNOSIS — Z905 Acquired absence of kidney: Secondary | ICD-10-CM | POA: Diagnosis not present

## 2019-10-30 DIAGNOSIS — C642 Malignant neoplasm of left kidney, except renal pelvis: Secondary | ICD-10-CM | POA: Diagnosis not present

## 2019-10-30 DIAGNOSIS — Z79899 Other long term (current) drug therapy: Secondary | ICD-10-CM | POA: Diagnosis not present

## 2019-10-30 DIAGNOSIS — I10 Essential (primary) hypertension: Secondary | ICD-10-CM | POA: Diagnosis not present

## 2019-10-30 DIAGNOSIS — C787 Secondary malignant neoplasm of liver and intrahepatic bile duct: Secondary | ICD-10-CM | POA: Diagnosis not present

## 2019-10-30 DIAGNOSIS — Z5112 Encounter for antineoplastic immunotherapy: Secondary | ICD-10-CM | POA: Diagnosis not present

## 2019-10-30 LAB — CMP (CANCER CENTER ONLY)
ALT: 25 U/L (ref 0–44)
AST: 24 U/L (ref 15–41)
Albumin: 4.3 g/dL (ref 3.5–5.0)
Alkaline Phosphatase: 89 U/L (ref 38–126)
Anion gap: 12 (ref 5–15)
BUN: 19 mg/dL (ref 6–20)
CO2: 18 mmol/L — ABNORMAL LOW (ref 22–32)
Calcium: 9.6 mg/dL (ref 8.9–10.3)
Chloride: 108 mmol/L (ref 98–111)
Creatinine: 1.54 mg/dL — ABNORMAL HIGH (ref 0.61–1.24)
GFR, Est AFR Am: >60 mL/min (ref >60)
GFR, Estimated: 52 mL/min — ABNORMAL LOW (ref >60)
Glucose, Bld: 120 mg/dL — ABNORMAL HIGH (ref 70–99)
Potassium: 4.1 mmol/L (ref 3.5–5.1)
Sodium: 138 mmol/L (ref 135–145)
Total Bilirubin: 0.4 mg/dL (ref 0.3–1.2)
Total Protein: 8.3 g/dL — ABNORMAL HIGH (ref 6.5–8.1)

## 2019-10-30 LAB — CBC WITH DIFFERENTIAL (CANCER CENTER ONLY)
Abs Immature Granulocytes: 0.02 10*3/uL (ref 0.00–0.07)
Basophils Absolute: 0.1 10*3/uL (ref 0.0–0.1)
Basophils Relative: 1 %
Eosinophils Absolute: 0.1 10*3/uL (ref 0.0–0.5)
Eosinophils Relative: 1 %
HCT: 42 % (ref 39.0–52.0)
Hemoglobin: 13 g/dL (ref 13.0–17.0)
Immature Granulocytes: 0 %
Lymphocytes Relative: 29 %
Lymphs Abs: 2.1 10*3/uL (ref 0.7–4.0)
MCH: 24.2 pg — ABNORMAL LOW (ref 26.0–34.0)
MCHC: 31 g/dL (ref 30.0–36.0)
MCV: 78.2 fL — ABNORMAL LOW (ref 80.0–100.0)
Monocytes Absolute: 0.5 10*3/uL (ref 0.1–1.0)
Monocytes Relative: 7 %
Neutro Abs: 4.6 10*3/uL (ref 1.7–7.7)
Neutrophils Relative %: 62 %
Platelet Count: 387 10*3/uL (ref 150–400)
RBC: 5.37 MIL/uL (ref 4.22–5.81)
RDW: 15.6 % — ABNORMAL HIGH (ref 11.5–15.5)
WBC Count: 7.4 10*3/uL (ref 4.0–10.5)
nRBC: 0 % (ref 0.0–0.2)

## 2019-10-30 LAB — TSH: TSH: 1.508 u[IU]/mL (ref 0.320–4.118)

## 2019-10-30 MED ORDER — SODIUM CHLORIDE 0.9 % IV SOLN
Freq: Once | INTRAVENOUS | Status: AC
  Filled 2019-10-30: qty 250

## 2019-10-30 MED ORDER — SODIUM CHLORIDE 0.9 % IV SOLN
200.0000 mg | Freq: Once | INTRAVENOUS | Status: AC
  Administered 2019-10-30: 200 mg via INTRAVENOUS
  Filled 2019-10-30: qty 8

## 2019-10-30 NOTE — Progress Notes (Signed)
Hematology and Oncology Follow Up Visit  Marcus Callahan [id number] 06-Dec-1970 49 y.o. 10/30/2019 10:11 AM Marcus Callahan, MDSagardia, [facility], *   Principle Diagnosis: 49 year old man with stage IV clear-cell renal cell carcinoma with hepatic involvement diagnosed in February 2021.    Prior Therapy:  He is status post robotic assisted laparoscopic left radical nephrectomy completed on August 26, 2019.  Final pathology indicated T3a tumor with invasion into the perirenal soft tissue, renal pelvis and segmental renal vein and renal sinus.  Current therapy: Axitinib 5 mg twice a day with Pembrolizumab 200 mg every 3 weeks started on September 18, 2019.  He is here for cycle 3 of therapy.  Interim History:  Marcus Callahan is here for return evaluation.  Since the last visit, he reports no major changes in his health.  He continues to tolerate therapy without any recent complaints.  He denies any nausea, vomiting or abdominal pain.  He denies any respiratory complaints.  He denies any shortness of breath or dyspnea on exertion.  He continues to work full-time without any decline in ability to do so.     Medications: Reviewed without changes. Current Outpatient Medications  Medication Sig Dispense Refill  . amLODipine (NORVASC) 10 MG tablet Take 1 tablet (10 mg total) by mouth daily. 30 tablet 1  . axitinib (INLYTA) 5 MG tablet Take 1 tablet (5 mg total) by mouth 2 (two) times daily. 60 tablet 0  . lisinopril-hydrochlorothiazide (ZESTORETIC) 20-25 MG tablet Take 1 tablet by mouth daily. 90 tablet 3  . metoprolol succinate (TOPROL-XL) 100 MG 24 hr tablet Take 1 tablet (100 mg total) by mouth daily. Take with or immediately following a meal. 30 tablet 1   No current facility-administered medications for this visit.     Allergies:  Allergies  Allergen Reactions  . Codeine Other (See Comments)    Nose bleeds      Physical Exam: Blood pressure (!) 154/106, pulse 83,  temperature 97.8 F (36.6 C), temperature source Temporal, resp. rate 20, height 5\' 10"  (1.778 m), weight 234 lb 12.8 oz (106.5 kg), SpO2 100 %.    ECOG: 0   General appearance: Comfortable appearing without any discomfort Head: Normocephalic without any trauma Oropharynx: Mucous membranes are moist and pink without any thrush or ulcers. Eyes: Pupils are equal and round reactive to light. Lymph nodes: No cervical, supraclavicular, inguinal or axillary lymphadenopathy.   Heart:regular rate and rhythm.  S1 and S2 without leg edema. Lung: Clear without any rhonchi or wheezes.  No dullness to percussion. Abdomin: Soft, nontender, nondistended with good bowel sounds.  No hepatosplenomegaly. Musculoskeletal: No joint deformity or effusion.  Full range of motion noted. Neurological: No deficits noted on motor, sensory and deep tendon reflex exam. Skin: No petechial rash or dryness.  Appeared moist.       Lab Results: Lab Results  Component Value Date   WBC 6.1 10/09/2019   HGB 10.7 (L) 10/09/2019   HCT 34.4 (L) 10/09/2019   MCV 78.5 (L) 10/09/2019   PLT 390 10/09/2019     Chemistry      Component Value Date/Time   NA 139 10/09/2019 1202   NA 141 09/28/2019 1517   K 4.6 10/09/2019 1202   CL 107 10/09/2019 1202   CO2 21 (L) 10/09/2019 1202   BUN 15 10/09/2019 1202   BUN 22 09/28/2019 1517   CREATININE 1.53 (H) 10/09/2019 1202      Component Value Date/Time   CALCIUM 9.3 10/09/2019 1202  ALKPHOS 99 10/09/2019 1202   AST 31 10/09/2019 1202   ALT 32 10/09/2019 1202   BILITOT 0.3 10/09/2019 1202          Impression and Plan:  49 year old man with:  1.  Stage IV clear-cell renal cell carcinoma with hepatic metastasis documented in February 2021.     The natural course of this disease was reviewed at this time.  He continues to tolerate the current dose of chemotherapy without any major complications after initial nephrectomy.  Risks and benefits of continuing this  therapy was reviewed today.  Potential complication with axitinib including hypertension, anorexia, and other complications.    He is agreeable to proceed and the plan is to repeat imaging studies after cycle 4 of therapy.  2.  Hypertension: Blood pressure still mildly elevated today and amlodipine was added to his regimen.  We will continue to monitor forward.   3.  Immune mediated complications.  Long-term complications including pneumonitis, colitis and thyroid disease were reviewed.  Not experiencing any at this time.  4.  Nausea prophylaxis: Compazine is available to him.  No nausea or vomiting at this time.  5.  IV access: Port-A-Cath remains in use for the time being.  6.  Follow-up: He will return in 3 weeks for the next cycle of therapy.   30 minutes were spent on this visit.  The time was dedicated to reviewing laboratory data, addressing complications related to therapy and future plan of care discussion.    Zola Button, MD 4/30/202110:11 AM

## 2019-10-30 NOTE — Patient Instructions (Signed)
[facility] Discharge Instructions for Patients Receiving Chemotherapy  Today you received the following chemotherapy agents:  Keytruda.  To help prevent nausea and vomiting after your treatment, we encourage you to take your nausea medication as directed.   If you develop nausea and vomiting that is not controlled by your nausea medication, call the clinic.   BELOW ARE SYMPTOMS THAT SHOULD BE REPORTED IMMEDIATELY:  *FEVER GREATER THAN 100.5 F  *CHILLS WITH OR WITHOUT FEVER  NAUSEA AND VOMITING THAT IS NOT CONTROLLED WITH YOUR NAUSEA MEDICATION  *UNUSUAL SHORTNESS OF BREATH  *UNUSUAL BRUISING OR BLEEDING  TENDERNESS IN MOUTH AND THROAT WITH OR WITHOUT PRESENCE OF ULCERS  *URINARY PROBLEMS  *BOWEL PROBLEMS  UNUSUAL RASH Items with * indicate a potential emergency and should be followed up as soon as possible.  Feel free to call the clinic should you have any questions or concerns. The clinic phone number is [phone].  Please show the CHEMO ALERT CARD at check-in to the Emergency Department and triage nurse.    

## 2019-10-30 NOTE — Telephone Encounter (Signed)
Scheduled appt per 4/30 los. ° ° °

## 2019-10-30 NOTE — Progress Notes (Signed)
Per Dr. Shadad, OK to treat with today's labs.  

## 2019-10-31 ENCOUNTER — Other Ambulatory Visit: Payer: Self-pay | Admitting: Emergency Medicine

## 2019-10-31 ENCOUNTER — Other Ambulatory Visit: Payer: Self-pay | Admitting: Oncology

## 2019-10-31 DIAGNOSIS — I10 Essential (primary) hypertension: Secondary | ICD-10-CM

## 2019-10-31 DIAGNOSIS — C642 Malignant neoplasm of left kidney, except renal pelvis: Secondary | ICD-10-CM

## 2019-11-03 ENCOUNTER — Encounter: Payer: Self-pay | Admitting: Oncology

## 2019-11-11 ENCOUNTER — Ambulatory Visit: Payer: BC Managed Care – PPO | Admitting: Adult Health Nurse Practitioner

## 2019-11-13 ENCOUNTER — Encounter: Payer: Self-pay | Admitting: Family Medicine

## 2019-11-13 ENCOUNTER — Other Ambulatory Visit: Payer: Self-pay

## 2019-11-13 ENCOUNTER — Ambulatory Visit (INDEPENDENT_AMBULATORY_CARE_PROVIDER_SITE_OTHER): Payer: BC Managed Care – PPO | Admitting: Family Medicine

## 2019-11-13 VITALS — BP 130/80 | HR 69 | Temp 97.8°F | Ht 70.0 in | Wt 243.0 lb

## 2019-11-13 DIAGNOSIS — I10 Essential (primary) hypertension: Secondary | ICD-10-CM | POA: Diagnosis not present

## 2019-11-13 DIAGNOSIS — C642 Malignant neoplasm of left kidney, except renal pelvis: Secondary | ICD-10-CM | POA: Diagnosis not present

## 2019-11-13 NOTE — Progress Notes (Signed)
Patient ID: HUGHEY STREHLE, male    DOB: 01-27-1971  Age: 49 y.o. MRN: [id number]  Chief Complaint  Patient presents with  . Medical Management of Chronic Issues    2 week f/u.   Marland Kitchen Hypertension    Subjective:   49 year old man who is here for follow-up with regard to his blood pressure.  He had been having his medications changed some.  Since he was on the immunotherapy medicine he has had higher blood pressure.  He takes it at home and it is been in the 140s over upper 80s most of the time.  He has no major complaints.  No chest pains or shortness of breath.  He is under treatment for metastatic kidney cancer.  He sees an oncologist at the cancer center.  Current allergies, medications, problem list, past/family and social histories reviewed.  Objective:  BP 130/80   Pulse 69   Temp 97.8 F (36.6 C) (Temporal)   Ht 5\' 10"  (1.778 m)   Wt 243 lb (110.2 kg)   SpO2 100%   BMI 34.87 kg/m   Moderately obese.  No acute distress.  Chest clear.  Heart regular without murmurs, gallops, or arrhythmias.  No thyromegaly.  Repeat blood pressure 136/84. Assessment & Plan:   Assessment: 1. Essential hypertension   2. Primary malignant neoplasm of left kidney with metastasis from kidney to other site [facility])       Plan: See instructions.  He says he has enough refills.  Return in 3 months.  Sooner if problems.  No orders of the defined types were placed in this encounter.   No orders of the defined types were placed in this encounter.        Patient Instructions     Continue current medications  If your blood pressures start running too low at any point come in, or if you are running frequent readings over 140/90 return.  Otherwise follow-up in about 3 months.  If you have lab work done today you will be contacted with your lab results within the next 2 weeks.  If you have not heard from [place] then please contact us. The fastest way to get your results is to register for My  Chart.   IF you received an x-ray today, you will receive an invoice from [facility]. Please contact [facility] at [phone] with questions or concerns regarding your invoice.   IF you received labwork today, you will receive an invoice from [facility]. Please contact [facility] at [phone] with questions or concerns regarding your invoice.   Our billing staff will not be able to assist you with questions regarding bills from these companies.  You will be contacted with the lab results as soon as they are available. The fastest way to get your results is to activate your My Chart account. Instructions are located on the last page of this paperwork. If you have not heard from [place] regarding the results in 2 weeks, please contact this office.         Return in about 3 months (around 02/13/2020) for Hypertension.   Ruben Reason, MD 11/13/2019

## 2019-11-13 NOTE — Patient Instructions (Addendum)
   Continue current medications  If your blood pressures start running too low at any point come in, or if you are running frequent readings over 140/90 return.  Otherwise follow-up in about 3 months.  If you have lab work done today you will be contacted with your lab results within the next 2 weeks.  If you have not heard from [place] then please contact us. The fastest way to get your results is to register for My Chart.   IF you received an x-ray today, you will receive an invoice from [facility]. Please contact [facility] at [phone] with questions or concerns regarding your invoice.   IF you received labwork today, you will receive an invoice from [facility]. Please contact [facility] at [phone] with questions or concerns regarding your invoice.   Our billing staff will not be able to assist you with questions regarding bills from these companies.  You will be contacted with the lab results as soon as they are available. The fastest way to get your results is to activate your My Chart account. Instructions are located on the last page of this paperwork. If you have not heard from [place] regarding the results in 2 weeks, please contact this office.

## 2019-11-17 NOTE — Progress Notes (Signed)
Pharmacist Chemotherapy Monitoring - Follow Up Assessment    I verify that I have reviewed each item in the below checklist:  . Regimen for the patient is scheduled for the appropriate day and plan matches scheduled date. Marland Kitchen Appropriate non-routine labs are ordered dependent on drug ordered. . If applicable, additional medications reviewed and ordered per protocol based on lifetime cumulative doses and/or treatment regimen.   Plan for follow-up and/or issues identified: No . I-vent associated with next due treatment: No . MD and/or nursing notified: No  Marcus Callahan D 11/17/2019 3:06 PM

## 2019-11-20 ENCOUNTER — Other Ambulatory Visit: Payer: Self-pay

## 2019-11-20 ENCOUNTER — Inpatient Hospital Stay: Payer: BC Managed Care – PPO

## 2019-11-20 ENCOUNTER — Inpatient Hospital Stay: Payer: BC Managed Care – PPO | Attending: Oncology

## 2019-11-20 ENCOUNTER — Inpatient Hospital Stay (HOSPITAL_BASED_OUTPATIENT_CLINIC_OR_DEPARTMENT_OTHER): Payer: BC Managed Care – PPO | Admitting: Oncology

## 2019-11-20 VITALS — BP 152/107 | HR 82 | Temp 98.5°F | Resp 18 | Wt 236.8 lb

## 2019-11-20 DIAGNOSIS — Z5112 Encounter for antineoplastic immunotherapy: Secondary | ICD-10-CM | POA: Insufficient documentation

## 2019-11-20 DIAGNOSIS — C787 Secondary malignant neoplasm of liver and intrahepatic bile duct: Secondary | ICD-10-CM | POA: Insufficient documentation

## 2019-11-20 DIAGNOSIS — Z905 Acquired absence of kidney: Secondary | ICD-10-CM | POA: Insufficient documentation

## 2019-11-20 DIAGNOSIS — I10 Essential (primary) hypertension: Secondary | ICD-10-CM | POA: Diagnosis not present

## 2019-11-20 DIAGNOSIS — C642 Malignant neoplasm of left kidney, except renal pelvis: Secondary | ICD-10-CM

## 2019-11-20 LAB — CBC WITH DIFFERENTIAL (CANCER CENTER ONLY)
Abs Immature Granulocytes: 0.03 10*3/uL (ref 0.00–0.07)
Basophils Absolute: 0.1 10*3/uL (ref 0.0–0.1)
Basophils Relative: 1 %
Eosinophils Absolute: 0.2 10*3/uL (ref 0.0–0.5)
Eosinophils Relative: 3 %
HCT: 41.5 % (ref 39.0–52.0)
Hemoglobin: 13.1 g/dL (ref 13.0–17.0)
Immature Granulocytes: 0 %
Lymphocytes Relative: 37 %
Lymphs Abs: 2.5 10*3/uL (ref 0.7–4.0)
MCH: 24.3 pg — ABNORMAL LOW (ref 26.0–34.0)
MCHC: 31.6 g/dL (ref 30.0–36.0)
MCV: 77 fL — ABNORMAL LOW (ref 80.0–100.0)
Monocytes Absolute: 0.5 10*3/uL (ref 0.1–1.0)
Monocytes Relative: 7 %
Neutro Abs: 3.6 10*3/uL (ref 1.7–7.7)
Neutrophils Relative %: 52 %
Platelet Count: 337 10*3/uL (ref 150–400)
RBC: 5.39 MIL/uL (ref 4.22–5.81)
RDW: 15.6 % — ABNORMAL HIGH (ref 11.5–15.5)
WBC Count: 6.9 10*3/uL (ref 4.0–10.5)
nRBC: 0 % (ref 0.0–0.2)

## 2019-11-20 LAB — CMP (CANCER CENTER ONLY)
ALT: 41 U/L (ref 0–44)
AST: 30 U/L (ref 15–41)
Albumin: 4.2 g/dL (ref 3.5–5.0)
Alkaline Phosphatase: 97 U/L (ref 38–126)
Anion gap: 14 (ref 5–15)
BUN: 21 mg/dL — ABNORMAL HIGH (ref 6–20)
CO2: 20 mmol/L — ABNORMAL LOW (ref 22–32)
Calcium: 9.5 mg/dL (ref 8.9–10.3)
Chloride: 104 mmol/L (ref 98–111)
Creatinine: 1.59 mg/dL — ABNORMAL HIGH (ref 0.61–1.24)
GFR, Est AFR Am: 57 mL/min — ABNORMAL LOW (ref >60)
GFR, Estimated: 50 mL/min — ABNORMAL LOW (ref >60)
Glucose, Bld: 122 mg/dL — ABNORMAL HIGH (ref 70–99)
Potassium: 3.9 mmol/L (ref 3.5–5.1)
Sodium: 138 mmol/L (ref 135–145)
Total Bilirubin: 0.3 mg/dL (ref 0.3–1.2)
Total Protein: 8.2 g/dL — ABNORMAL HIGH (ref 6.5–8.1)

## 2019-11-20 LAB — TSH: TSH: 2.243 u[IU]/mL (ref 0.320–4.118)

## 2019-11-20 MED ORDER — SODIUM CHLORIDE 0.9 % IV SOLN
Freq: Once | INTRAVENOUS | Status: AC
  Filled 2019-11-20: qty 250

## 2019-11-20 MED ORDER — SODIUM CHLORIDE 0.9 % IV SOLN
200.0000 mg | Freq: Once | INTRAVENOUS | Status: AC
  Administered 2019-11-20: 200 mg via INTRAVENOUS
  Filled 2019-11-20: qty 8

## 2019-11-20 NOTE — Patient Instructions (Signed)
[facility] Discharge Instructions for Patients Receiving Chemotherapy  Today you received the following immunotherapy agents:  Keytruda  To help prevent nausea and vomiting after your treatment, we encourage you to take your nausea medication as prescribed. If you develop nausea and vomiting that is not controlled by your nausea medication, call the clinic.   BELOW ARE SYMPTOMS THAT SHOULD BE REPORTED IMMEDIATELY:  *FEVER GREATER THAN 100.5 F  *CHILLS WITH OR WITHOUT FEVER  NAUSEA AND VOMITING THAT IS NOT CONTROLLED WITH YOUR NAUSEA MEDICATION  *UNUSUAL SHORTNESS OF BREATH  *UNUSUAL BRUISING OR BLEEDING  TENDERNESS IN MOUTH AND THROAT WITH OR WITHOUT PRESENCE OF ULCERS  *URINARY PROBLEMS  *BOWEL PROBLEMS  UNUSUAL RASH Items with * indicate a potential emergency and should be followed up as soon as possible.  Feel free to call the clinic should you have any questions or concerns. The clinic phone number is [phone].  Please show the CHEMO ALERT CARD at check-in to the Emergency Department and triage nurse.   

## 2019-11-20 NOTE — Progress Notes (Signed)
OK to treat per Dr Alen Blew with elevated BUN & Creat.

## 2019-11-20 NOTE — Progress Notes (Signed)
Hematology and Oncology Follow Up Visit  Marcus Callahan [id number] July 31, 1970 49 y.o. 11/20/2019 8:11 AM [place], [place], [place], [place]   Principle Diagnosis: 49 year old man with kidney cancer diagnosed in February 2021.  He was found to have stage IV clear-cell with liver involvement.  Prior Therapy:  He is status post robotic assisted laparoscopic left radical nephrectomy completed on August 26, 2019.  Final pathology indicated T3a tumor with invasion into the perirenal soft tissue, renal pelvis and segmental renal vein and renal sinus.  Current therapy: Axitinib 5 mg twice a day with Pembrolizumab 200 mg every 3 weeks started on September 18, 2019.  He is here for cycle 4 of therapy.  Interim History:  Mr. Scronce presents today for a follow-up visit.  Since the last visit, he reports no major changes in his health.  He continues to tolerate current therapy without any complications.  He denies any nausea, vomiting or abdominal pain.  Denies any diarrhea or excessive fatigue.  Continues to work full-time without any decline in ability to do so.     Medications: Unchanged on review. Current Outpatient Medications  Medication Sig Dispense Refill  . amLODipine (NORVASC) 10 MG tablet Take 1 tablet (10 mg total) by mouth daily. 30 tablet 1  . INLYTA 5 MG tablet TAKE 1 TABLET TWICE A DAY 60 tablet 0  . lisinopril-hydrochlorothiazide (ZESTORETIC) 20-25 MG tablet TAKE 1 TABLET BY MOUTH DAILY 90 tablet 1  . metoprolol succinate (TOPROL-XL) 100 MG 24 hr tablet Take 1 tablet (100 mg total) by mouth daily. Take with or immediately following a meal. 30 tablet 1   No current facility-administered medications for this visit.     Allergies:  Allergies  Allergen Reactions  . Codeine Other (See Comments)    Nose bleeds      Physical Exam:  Blood pressure (!) 152/107, pulse 82, temperature 98.5 F (36.9 C), temperature source Oral, resp. rate 18, weight 236 lb 12.8  oz (107.4 kg), SpO2 100 %.    ECOG: 0     General appearance: Alert, awake without any distress. Head: Atraumatic without abnormalities Oropharynx: Without any thrush or ulcers. Eyes: No scleral icterus. Lymph nodes: No lymphadenopathy noted in the cervical, supraclavicular, or axillary nodes Heart:regular rate and rhythm, without any murmurs or gallops.   Lung: Clear to auscultation without any rhonchi, wheezes or dullness to percussion. Abdomin: Soft, nontender without any shifting dullness or ascites. Musculoskeletal: No clubbing or cyanosis. Neurological: No motor or sensory deficits. Skin: No rashes or lesions.        Lab Results: Lab Results  Component Value Date   WBC 7.4 10/30/2019   HGB 13.0 10/30/2019   HCT 42.0 10/30/2019   MCV 78.2 (L) 10/30/2019   PLT 387 10/30/2019     Chemistry      Component Value Date/Time   NA 138 10/30/2019 1004   NA 141 09/28/2019 1517   K 4.1 10/30/2019 1004   CL 108 10/30/2019 1004   CO2 18 (L) 10/30/2019 1004   BUN 19 10/30/2019 1004   BUN 22 09/28/2019 1517   CREATININE 1.54 (H) 10/30/2019 1004      Component Value Date/Time   CALCIUM 9.6 10/30/2019 1004   ALKPHOS 89 10/30/2019 1004   AST 24 10/30/2019 1004   ALT 25 10/30/2019 1004   BILITOT 0.4 10/30/2019 1004          Impression and Plan:  49 year old man with:  1.  Renal cell carcinoma diagnosed  in February 2021 after presenting with stage IV disease and hepatic metastasis.   He continues to tolerate pembrolizumab and axitinib without any major complications.  Risks and benefits of continuing this therapy were reviewed at this time.  The plan is to restage him with imaging studies before the next visit and this will determine the duration of therapy.  Alternative treatment options would be combination of immunotherapy agents or different oral targeted therapy.  The plan is to continue with the current regimen and obtain imaging studies before the next  cycle of therapy.  2.  Hypertension: His blood pressure is still elevated although his blood pressure runs slightly lower than recorded today.  This is related to axitinib and will determine dose reduction of this medication pending the results of his CT scan.  3.  Immune mediated complications.  I continue to educate him about potential complications including pneumonitis, colitis and thyroid disease.  He is not experiencing any at this time.  4.  Nausea prophylaxis: No nausea or vomiting reported at this time.  Compazine is available to him.  5.  IV access: Peripheral veins currently in use without any complications.  6.  Follow-up: In 3 weeks for repeat evaluation.   30 minutes were dedicated to this encounter.  Time was spent on updating his disease status, addressing complications related to his therapy, treatment options for the future and organizing plan of care.    Zola Button, MD 5/21/20218:11 AM

## 2019-11-22 ENCOUNTER — Other Ambulatory Visit: Payer: Self-pay | Admitting: Emergency Medicine

## 2019-11-22 ENCOUNTER — Encounter: Payer: Self-pay | Admitting: Family Medicine

## 2019-11-22 DIAGNOSIS — I10 Essential (primary) hypertension: Secondary | ICD-10-CM

## 2019-11-23 ENCOUNTER — Other Ambulatory Visit: Payer: Self-pay

## 2019-11-23 DIAGNOSIS — I10 Essential (primary) hypertension: Secondary | ICD-10-CM

## 2019-11-23 MED ORDER — METOPROLOL SUCCINATE ER 100 MG PO TB24: 100.0000 mg | ORAL_TABLET | Freq: Every day | ORAL | 2 refills | Status: AC

## 2019-11-23 MED ORDER — AMLODIPINE BESYLATE 10 MG PO TABS: 10.0000 mg | ORAL_TABLET | Freq: Every day | ORAL | 2 refills | Status: AC

## 2019-11-23 NOTE — Telephone Encounter (Signed)
Patient sent a mychart message requesting 90 day supply for blood pressure medications, please review and refill if appropriate.

## 2019-12-04 ENCOUNTER — Encounter: Payer: Self-pay | Admitting: Family Medicine

## 2019-12-04 ENCOUNTER — Other Ambulatory Visit: Payer: Self-pay

## 2019-12-04 ENCOUNTER — Ambulatory Visit (HOSPITAL_COMMUNITY)
Admission: RE | Admit: 2019-12-04 | Discharge: 2019-12-04 | Disposition: A | Discharge status: Home / Self Care | Payer: BC Managed Care – PPO | Source: Ambulatory Visit | Attending: Oncology | Admitting: Oncology

## 2019-12-04 ENCOUNTER — Encounter: Payer: Self-pay | Admitting: Oncology

## 2019-12-04 DIAGNOSIS — C787 Secondary malignant neoplasm of liver and intrahepatic bile duct: Secondary | ICD-10-CM | POA: Diagnosis not present

## 2019-12-04 DIAGNOSIS — C642 Malignant neoplasm of left kidney, except renal pelvis: Secondary | ICD-10-CM | POA: Insufficient documentation

## 2019-12-04 IMAGING — CT CT ABD-PEL WO/W CM
3 of 13 series · 11 of 46 positions shown, 17 images · IV contrast (omnipaque)
Comparison: Abdomen MRI on [DATE]

CLINICAL DATA: Follow-up left renal cell carcinoma. Previous left
nephrectomy. Ongoing chemotherapy.

EXAM:
CT CHEST WITH CONTRAST
CT ABDOMEN AND PELVIS WITH AND WITHOUT CONTRAST
TECHNIQUE: Multidetector CT imaging of the chest was performed during
intravenous contrast administration. Multidetector CT imaging of the
abdomen and pelvis was performed following the standard protocol
before and during bolus administration of intravenous contrast.
CONTRAST:  100mL OMNIPAQUE IOHEXOL 300 MG/ML  SOLN

[Series 2: axial pre · axial · non-contrast · 0.77mm/px · z∈[+1158,+1392]mm · 3 of 157 slices shown]
[im 40/157  soft-tissue]
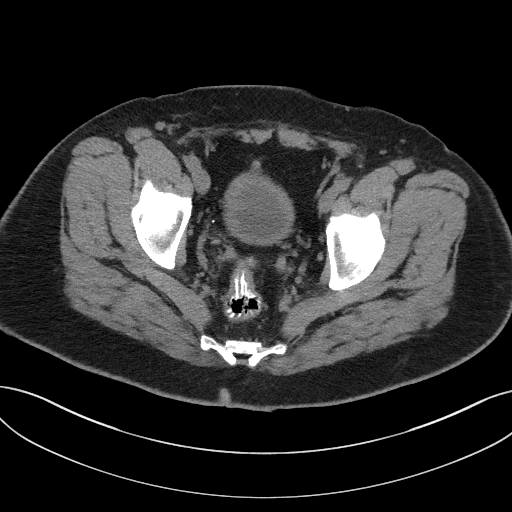
[im 79/157  soft-tissue]
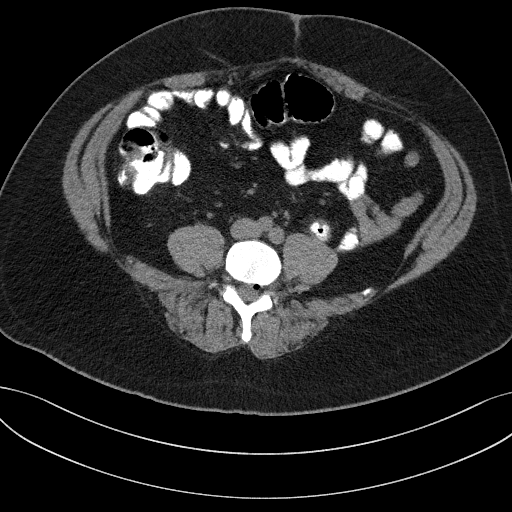
[im 118/157  soft-tissue]
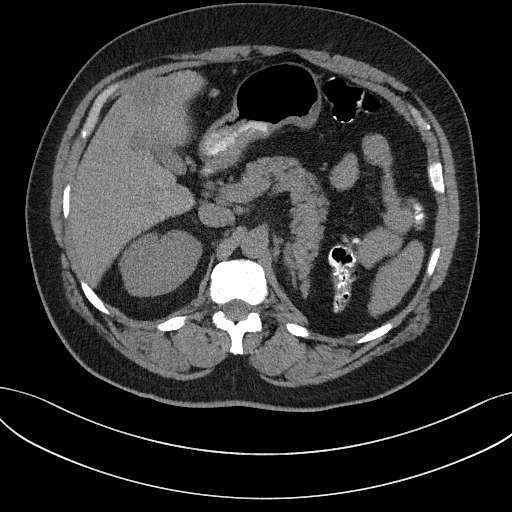

[Series 4: coronal pre · coronal · non-contrast · 0.92mm/px · 2 of 121 slices shown, 3 images]
[im 41/121  soft-tissue]
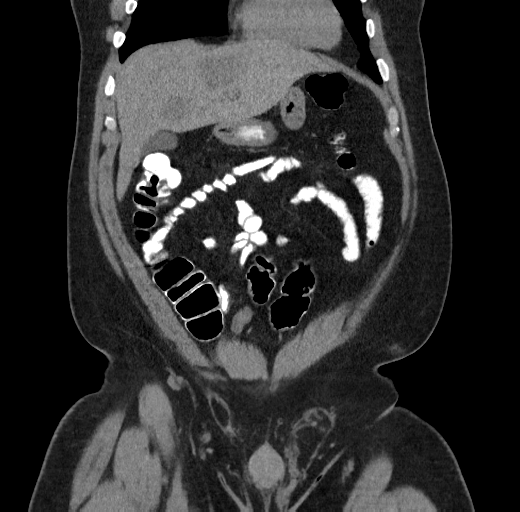
[im 41/121  bone]
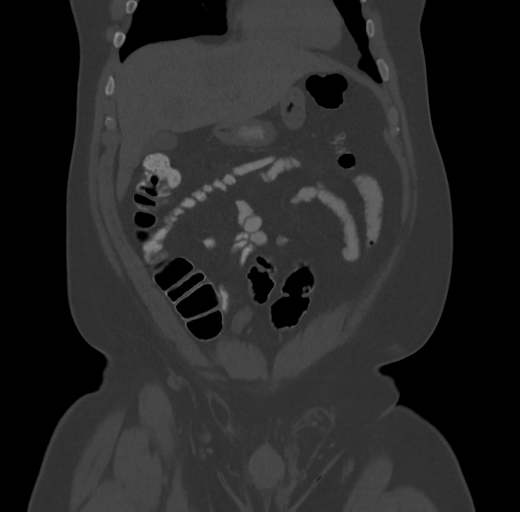
[im 81/121  soft-tissue]
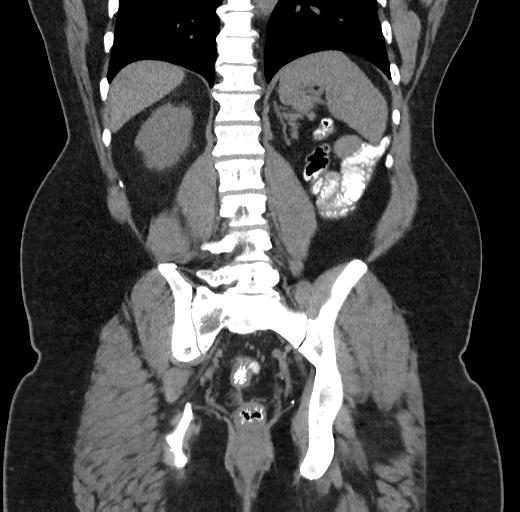

[Series 11: axial nephro · axial · 0.74mm/px · z∈[+1134,+1599]mm · 6 of 218 slices shown, 11 images]
[im 32/218  soft-tissue]
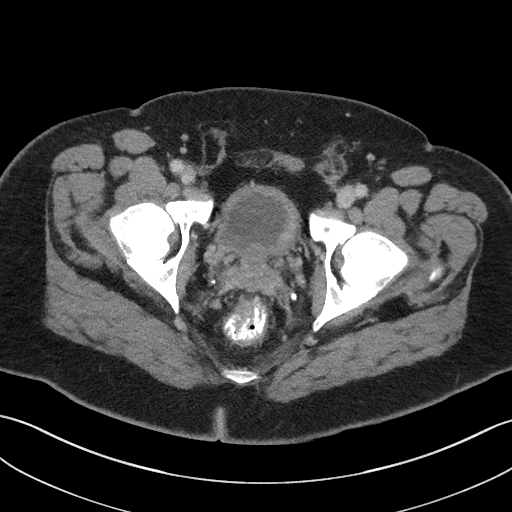
[im 32/218  bone]
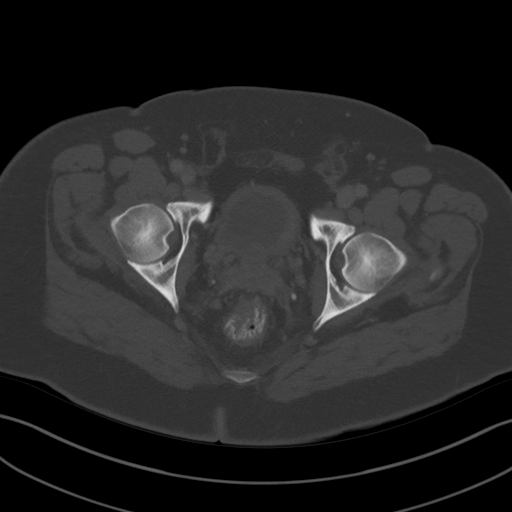
[im 63/218  soft-tissue]
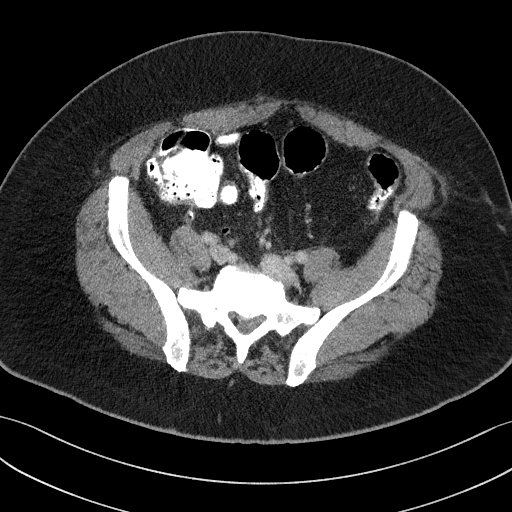
[im 94/218  soft-tissue]
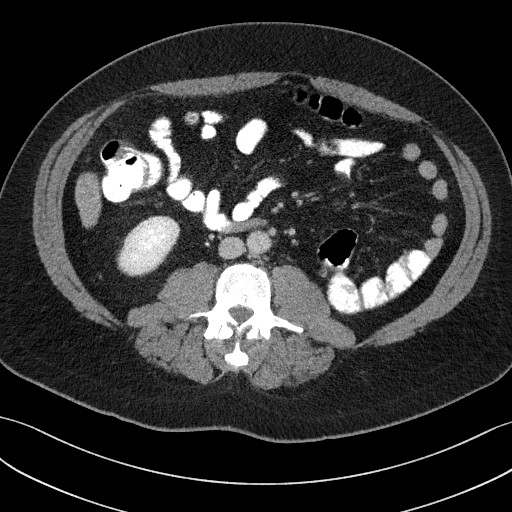
[im 94/218  lung]
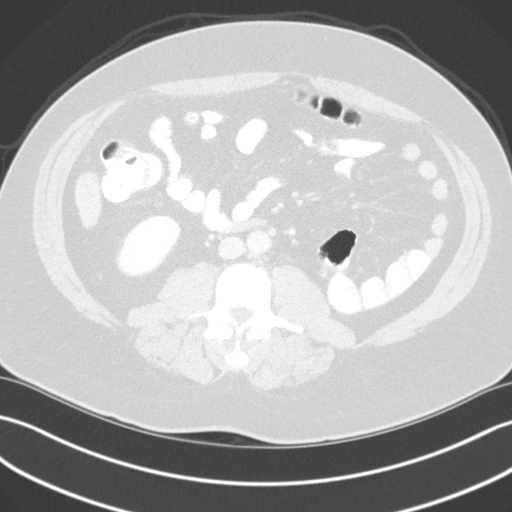
[im 125/218  soft-tissue]
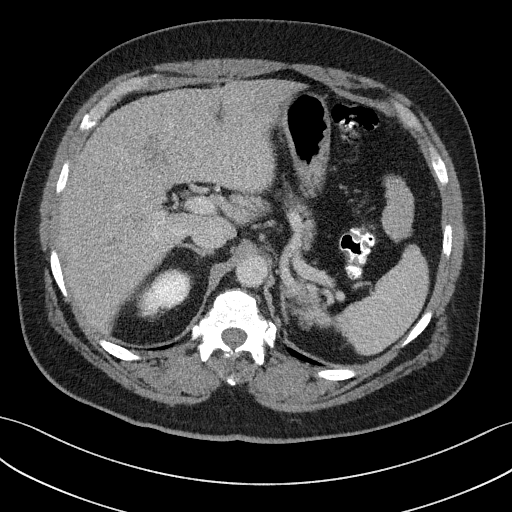
[im 125/218  lung]
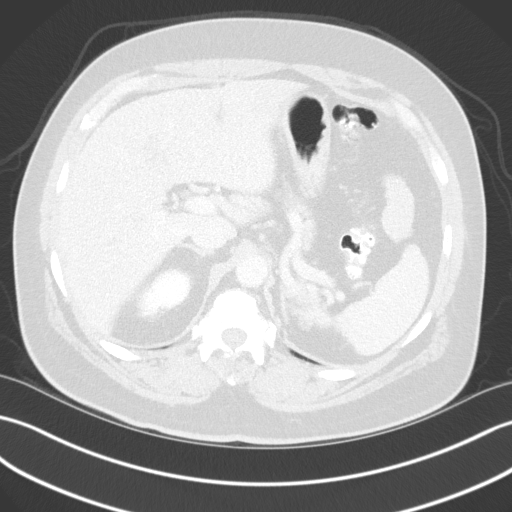
[im 156/218  soft-tissue]
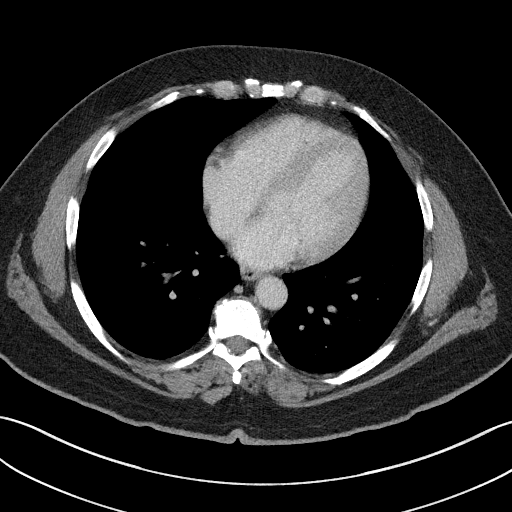
[im 156/218  lung]
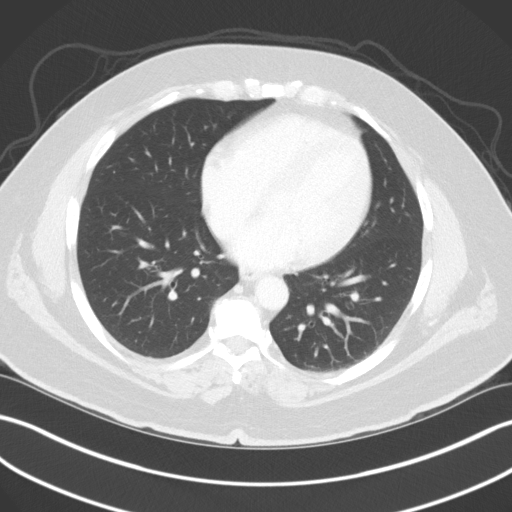
[im 187/218  soft-tissue]
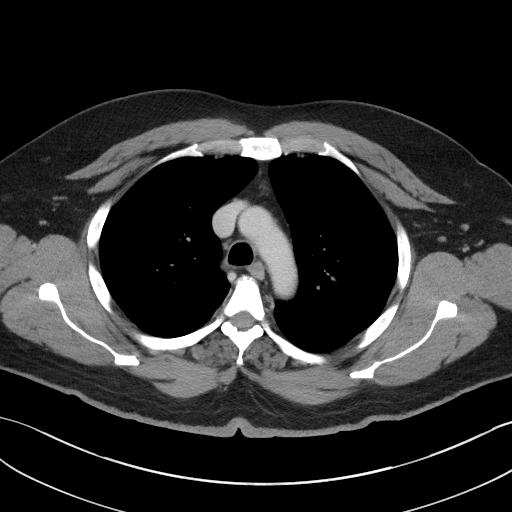
[im 187/218  lung]
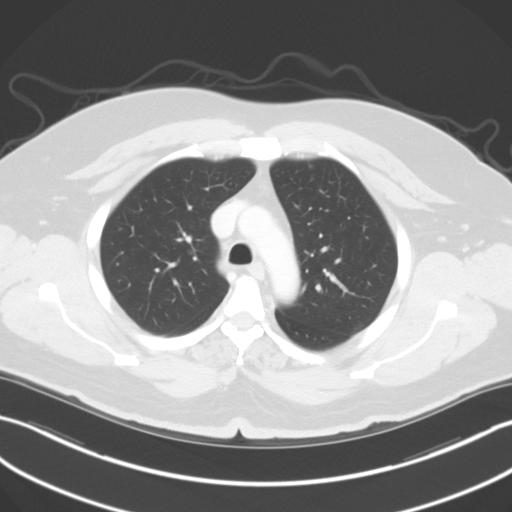

[11 of 46 positions shown; findings below may reference images not displayed]

FINDINGS: CT CHEST FINDINGS

Cardiovascular: No acute findings.

Mediastinum/Lymph Nodes: No masses or pathologically enlarged lymph
nodes identified.

Lungs/Pleura: No pulmonary infiltrate or mass identified. A 4 mm
granuloma containing calcification is noted in the anterior left
upper lobe. No effusion present.

Musculoskeletal:  No suspicious bone lesions identified.

CT ABDOMEN AND PELVIS FINDINGS

Hepatobiliary: Multiple hypovascular metastases are again seen in
the right and left lobes. New low-attenuation mass is seen in the
caudate lobe measuring 1.6 x 1.5 cm on image 89/11. Another index
lesion in inferior right hepatic lobe measures 3.8 x 2.3 cm on image
121/11, compared to 2.7 x 1.5 cm on prior study. Gallbladder is
unremarkable. No evidence of biliary ductal dilatation.

Pancreas:  No mass or inflammatory changes.

Spleen:  Within normal limits in size and appearance.

Adrenals/Urinary tract: Patient has undergone left nephrectomy since
prior study. No residual mass seen within the left nephrectomy bed.
The right kidney is normal in appearance. No evidence of
hydronephrosis. Mild diffuse bladder wall thickening seen, which may
be due to chronic bladder outlet obstruction or cystitis.

Stomach/Bowel: No evidence of obstruction, inflammatory process, or
abnormal fluid collections.

Vascular/Lymphatic: No pathologically enlarged lymph nodes
identified. No abdominal aortic aneurysm.

Reproductive:  No mass or other significant abnormality identified.

Other:  Small bilateral inguinal hernias containing only fat.

Musculoskeletal:  No suspicious bone lesions identified.
IMPRESSION: 1. Mild interval progression in diffuse liver metastases. No other
sites of malignancy identified.
2. Mild diffuse bladder wall thickening, which may be due to chronic
bladder outlet obstruction or cystitis.
3. Small bilateral inguinal hernias containing only fat.

## 2019-12-04 IMAGING — CT CT CHEST W/ CM
3 of 13 series · 11 of 46 positions shown, 17 images · IV contrast (OMNIPAQUE)
Comparison: Abdomen MRI on [DATE]

CLINICAL DATA: Follow-up left renal cell carcinoma. Previous left
nephrectomy. Ongoing chemotherapy.

EXAM:
CT CHEST WITH CONTRAST
CT ABDOMEN AND PELVIS WITH AND WITHOUT CONTRAST
TECHNIQUE: Multidetector CT imaging of the chest was performed during
intravenous contrast administration. Multidetector CT imaging of the
abdomen and pelvis was performed following the standard protocol
before and during bolus administration of intravenous contrast.
CONTRAST:  100mL OMNIPAQUE IOHEXOL 300 MG/ML  SOLN

[Series 2: axial pre · axial · non-contrast · 0.77mm/px · z∈[+1158,+1392]mm · 3 of 157 slices shown]
[im 40/157  soft-tissue]
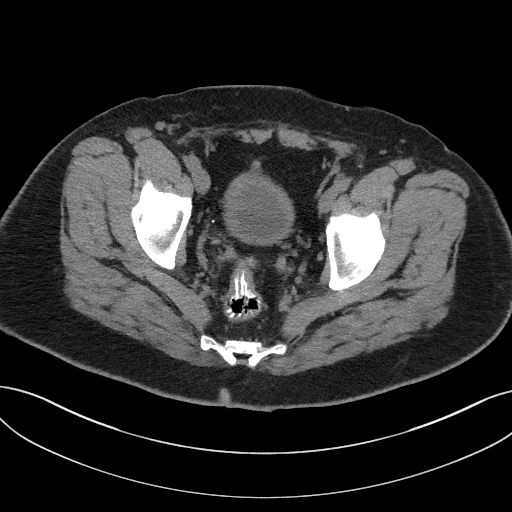
[im 79/157  soft-tissue]
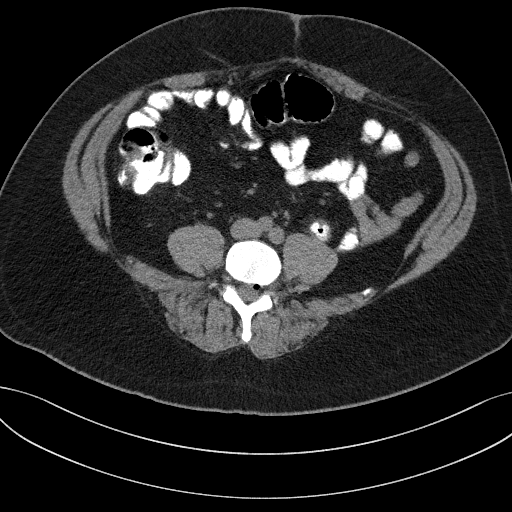
[im 118/157  soft-tissue]
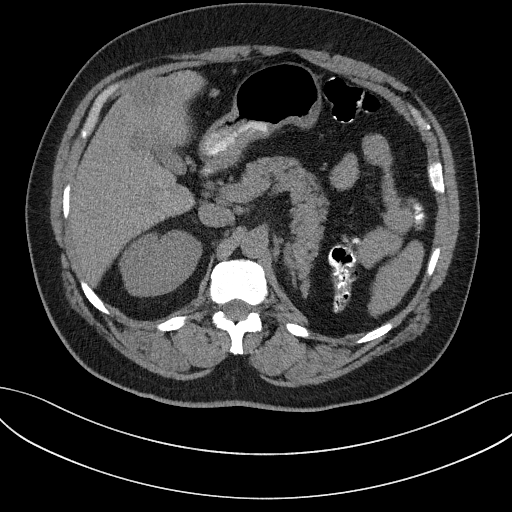

[Series 4: coronal pre · coronal · non-contrast · 0.92mm/px · 2 of 121 slices shown, 3 images]
[im 41/121  soft-tissue]
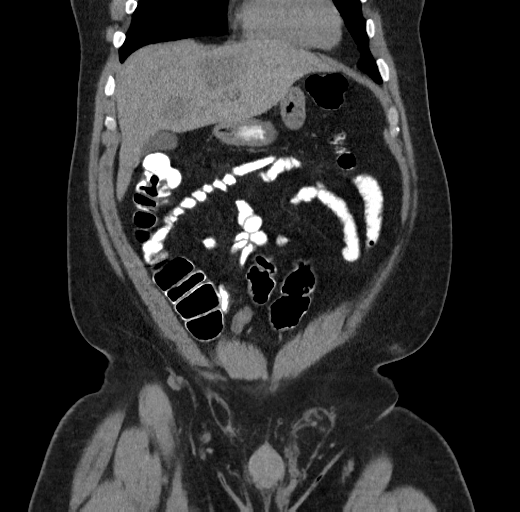
[im 41/121  bone]
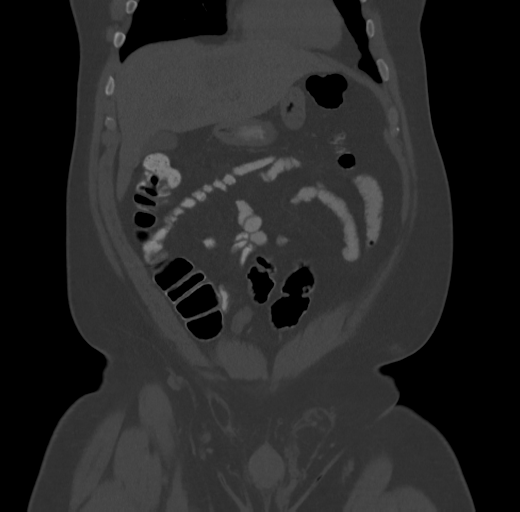
[im 81/121  soft-tissue]
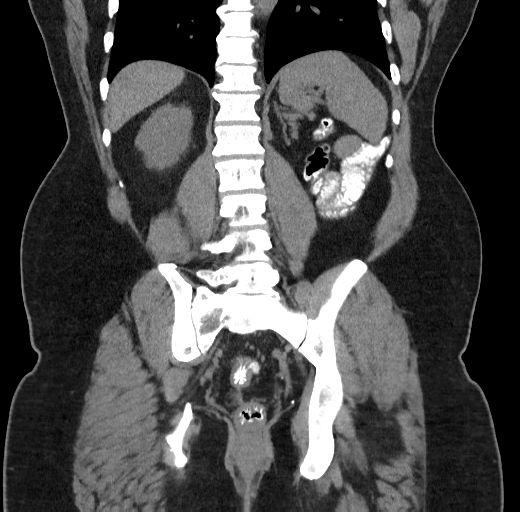

[Series 11: axial nephro · axial · 0.74mm/px · z∈[+1134,+1599]mm · 6 of 218 slices shown, 11 images]
[im 32/218  soft-tissue]
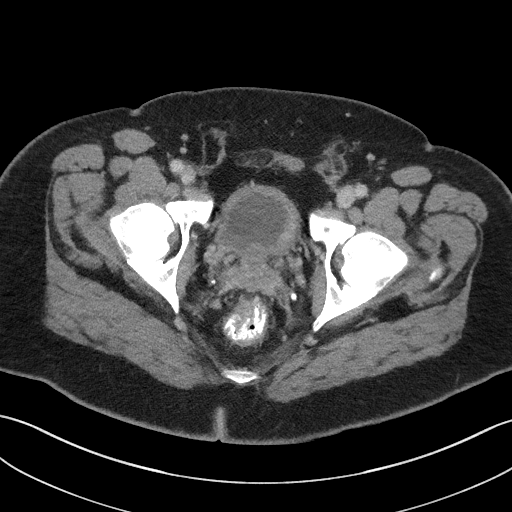
[im 32/218  bone]
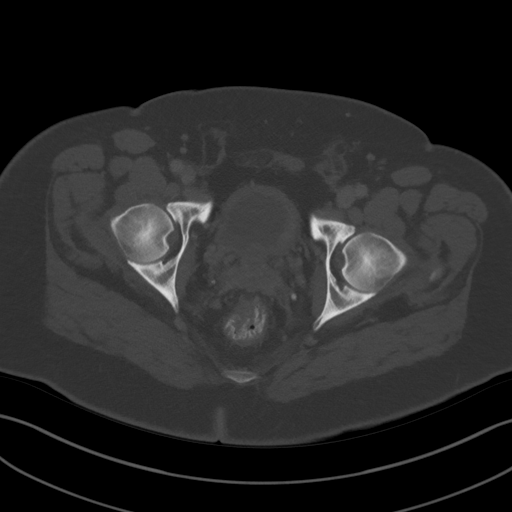
[im 63/218  soft-tissue]
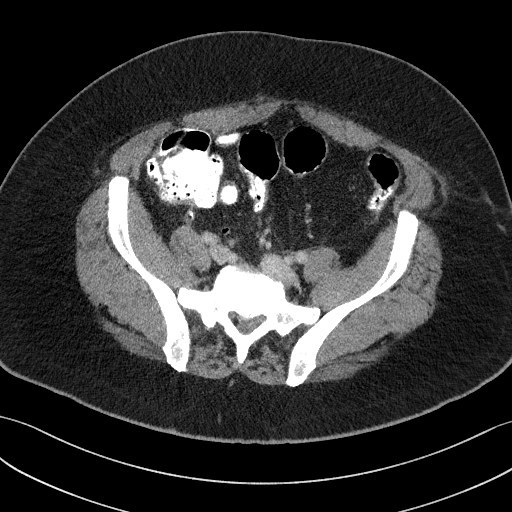
[im 94/218  soft-tissue]
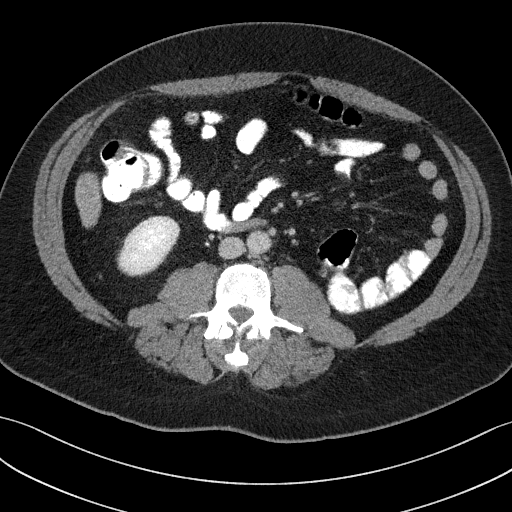
[im 94/218  lung]
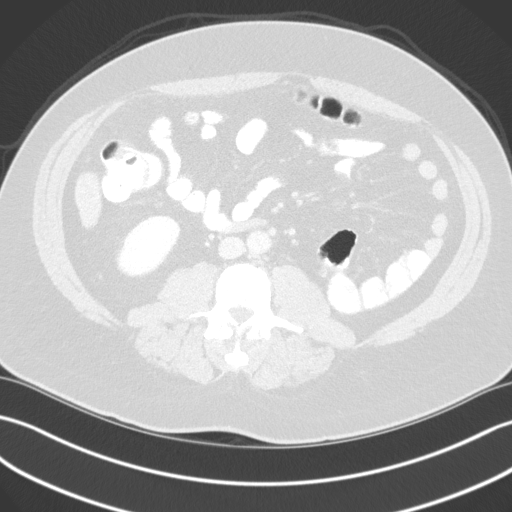
[im 125/218  soft-tissue]
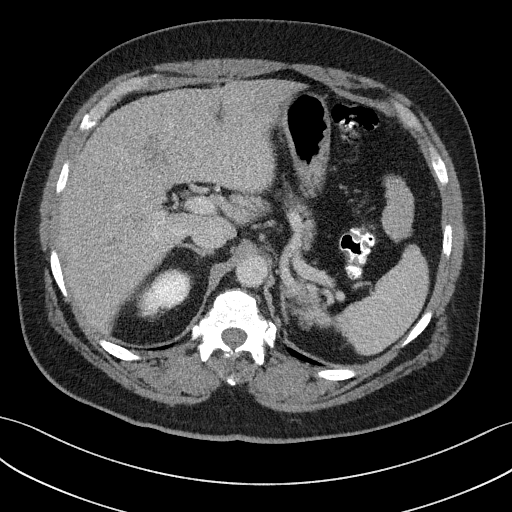
[im 125/218  lung]
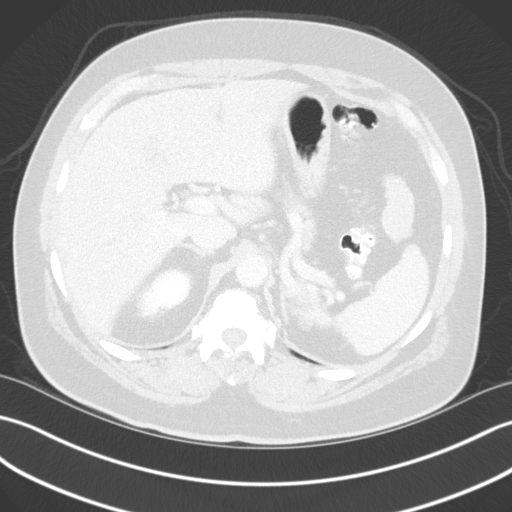
[im 156/218  soft-tissue]
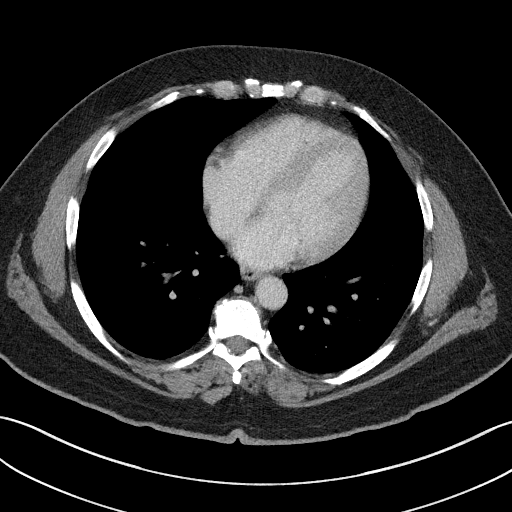
[im 156/218  lung]
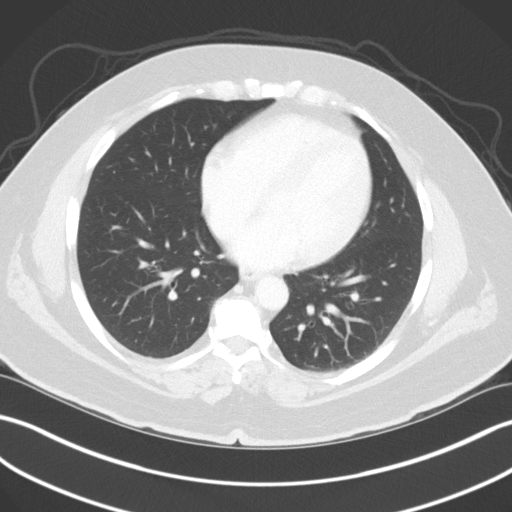
[im 187/218  soft-tissue]
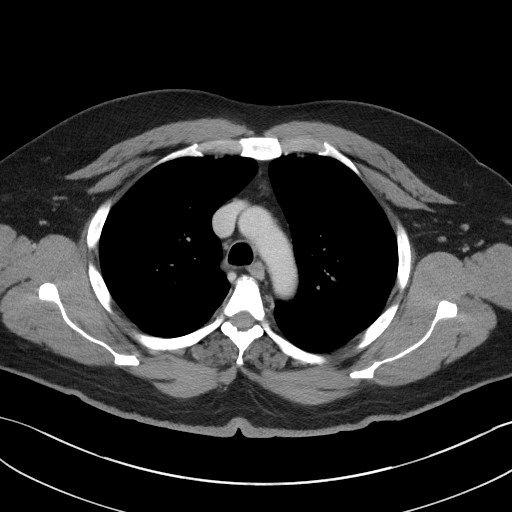
[im 187/218  lung]
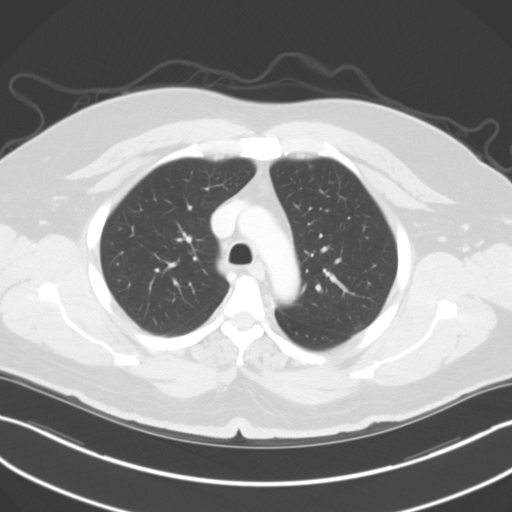

[11 of 46 positions shown; findings below may reference images not displayed]

FINDINGS: CT CHEST FINDINGS

Cardiovascular: No acute findings.

Mediastinum/Lymph Nodes: No masses or pathologically enlarged lymph
nodes identified.

Lungs/Pleura: No pulmonary infiltrate or mass identified. A 4 mm
granuloma containing calcification is noted in the anterior left
upper lobe. No effusion present.

Musculoskeletal:  No suspicious bone lesions identified.

CT ABDOMEN AND PELVIS FINDINGS

Hepatobiliary: Multiple hypovascular metastases are again seen in
the right and left lobes. New low-attenuation mass is seen in the
caudate lobe measuring 1.6 x 1.5 cm on image 89/11. Another index
lesion in inferior right hepatic lobe measures 3.8 x 2.3 cm on image
121/11, compared to 2.7 x 1.5 cm on prior study. Gallbladder is
unremarkable. No evidence of biliary ductal dilatation.

Pancreas:  No mass or inflammatory changes.

Spleen:  Within normal limits in size and appearance.

Adrenals/Urinary tract: Patient has undergone left nephrectomy since
prior study. No residual mass seen within the left nephrectomy bed.
The right kidney is normal in appearance. No evidence of
hydronephrosis. Mild diffuse bladder wall thickening seen, which may
be due to chronic bladder outlet obstruction or cystitis.

Stomach/Bowel: No evidence of obstruction, inflammatory process, or
abnormal fluid collections.

Vascular/Lymphatic: No pathologically enlarged lymph nodes
identified. No abdominal aortic aneurysm.

Reproductive:  No mass or other significant abnormality identified.

Other:  Small bilateral inguinal hernias containing only fat.

Musculoskeletal:  No suspicious bone lesions identified.
IMPRESSION: 1. Mild interval progression in diffuse liver metastases. No other
sites of malignancy identified.
2. Mild diffuse bladder wall thickening, which may be due to chronic
bladder outlet obstruction or cystitis.
3. Small bilateral inguinal hernias containing only fat.

## 2019-12-04 MED ORDER — IOHEXOL 300 MG/ML  SOLN
100.0000 mL | Freq: Once | INTRAMUSCULAR | Status: AC | PRN
  Administered 2019-12-04: 100 mL via INTRAVENOUS

## 2019-12-04 MED ORDER — SODIUM CHLORIDE (PF) 0.9 % IJ SOLN
INTRAMUSCULAR | Status: AC
  Filled 2019-12-04: qty 50

## 2019-12-07 NOTE — Telephone Encounter (Signed)
Results

## 2019-12-11 ENCOUNTER — Inpatient Hospital Stay: Payer: BC Managed Care – PPO | Attending: Oncology

## 2019-12-11 ENCOUNTER — Inpatient Hospital Stay: Payer: BC Managed Care – PPO

## 2019-12-11 ENCOUNTER — Telehealth: Payer: Self-pay | Admitting: Oncology

## 2019-12-11 ENCOUNTER — Other Ambulatory Visit: Payer: Self-pay

## 2019-12-11 ENCOUNTER — Inpatient Hospital Stay (HOSPITAL_BASED_OUTPATIENT_CLINIC_OR_DEPARTMENT_OTHER): Payer: BC Managed Care – PPO | Admitting: Oncology

## 2019-12-11 VITALS — BP 144/102 | HR 74 | Temp 97.5°F | Resp 18 | Ht 70.0 in | Wt 240.3 lb

## 2019-12-11 DIAGNOSIS — Z5112 Encounter for antineoplastic immunotherapy: Secondary | ICD-10-CM | POA: Insufficient documentation

## 2019-12-11 DIAGNOSIS — C642 Malignant neoplasm of left kidney, except renal pelvis: Secondary | ICD-10-CM

## 2019-12-11 DIAGNOSIS — E079 Disorder of thyroid, unspecified: Secondary | ICD-10-CM | POA: Diagnosis not present

## 2019-12-11 DIAGNOSIS — C787 Secondary malignant neoplasm of liver and intrahepatic bile duct: Secondary | ICD-10-CM | POA: Diagnosis not present

## 2019-12-11 DIAGNOSIS — Z905 Acquired absence of kidney: Secondary | ICD-10-CM | POA: Insufficient documentation

## 2019-12-11 DIAGNOSIS — Z79899 Other long term (current) drug therapy: Secondary | ICD-10-CM | POA: Insufficient documentation

## 2019-12-11 DIAGNOSIS — I10 Essential (primary) hypertension: Secondary | ICD-10-CM | POA: Diagnosis not present

## 2019-12-11 LAB — CMP (CANCER CENTER ONLY)
ALT: 20 U/L (ref 0–44)
AST: 20 U/L (ref 15–41)
Albumin: 4.2 g/dL (ref 3.5–5.0)
Alkaline Phosphatase: 78 U/L (ref 38–126)
Anion gap: 13 (ref 5–15)
BUN: 16 mg/dL (ref 6–20)
CO2: 19 mmol/L — ABNORMAL LOW (ref 22–32)
Calcium: 9.8 mg/dL (ref 8.9–10.3)
Chloride: 107 mmol/L (ref 98–111)
Creatinine: 1.44 mg/dL — ABNORMAL HIGH (ref 0.61–1.24)
GFR, Est AFR Am: >60 mL/min (ref >60)
GFR, Estimated: 56 mL/min — ABNORMAL LOW (ref >60)
Glucose, Bld: 95 mg/dL (ref 70–99)
Potassium: 3.7 mmol/L (ref 3.5–5.1)
Sodium: 139 mmol/L (ref 135–145)
Total Bilirubin: 0.4 mg/dL (ref 0.3–1.2)
Total Protein: 7.9 g/dL (ref 6.5–8.1)

## 2019-12-11 LAB — CBC WITH DIFFERENTIAL (CANCER CENTER ONLY)
Abs Immature Granulocytes: 0.02 10*3/uL (ref 0.00–0.07)
Basophils Absolute: 0 10*3/uL (ref 0.0–0.1)
Basophils Relative: 0 %
Eosinophils Absolute: 0.1 10*3/uL (ref 0.0–0.5)
Eosinophils Relative: 2 %
HCT: 37.6 % — ABNORMAL LOW (ref 39.0–52.0)
Hemoglobin: 12.2 g/dL — ABNORMAL LOW (ref 13.0–17.0)
Immature Granulocytes: 0 %
Lymphocytes Relative: 30 %
Lymphs Abs: 2.3 10*3/uL (ref 0.7–4.0)
MCH: 24.4 pg — ABNORMAL LOW (ref 26.0–34.0)
MCHC: 32.4 g/dL (ref 30.0–36.0)
MCV: 75.4 fL — ABNORMAL LOW (ref 80.0–100.0)
Monocytes Absolute: 0.7 10*3/uL (ref 0.1–1.0)
Monocytes Relative: 9 %
Neutro Abs: 4.4 10*3/uL (ref 1.7–7.7)
Neutrophils Relative %: 59 %
Platelet Count: 320 10*3/uL (ref 150–400)
RBC: 4.99 MIL/uL (ref 4.22–5.81)
RDW: 16.4 % — ABNORMAL HIGH (ref 11.5–15.5)
WBC Count: 7.5 10*3/uL (ref 4.0–10.5)
nRBC: 0 % (ref 0.0–0.2)

## 2019-12-11 LAB — TSH: TSH: 1.284 u[IU]/mL (ref 0.320–4.118)

## 2019-12-11 MED ORDER — SODIUM CHLORIDE 0.9 % IV SOLN
Freq: Once | INTRAVENOUS | Status: AC
  Filled 2019-12-11: qty 250

## 2019-12-11 MED ORDER — SODIUM CHLORIDE 0.9 % IV SOLN
200.0000 mg | Freq: Once | INTRAVENOUS | Status: AC
  Administered 2019-12-11: 200 mg via INTRAVENOUS
  Filled 2019-12-11: qty 8

## 2019-12-11 NOTE — Telephone Encounter (Signed)
Scheduled appt per 6/11 los.  Spoke with infusion nurse and pt will get an appt calendar after treatment.

## 2019-12-11 NOTE — Progress Notes (Signed)
Hematology and Oncology Follow Up Visit  Marcus Callahan [id number] [birth year] 49 y.o. 12/11/2019 11:28 AM Marcus Callahan, [place], [place]   Principle Diagnosis: 49 year old man with stage IV clear-cell renal cell carcinoma diagnosed in February 2021.   Prior Therapy:  He is status post robotic assisted laparoscopic left radical nephrectomy completed on August 26, 2019.  Final pathology indicated T3a tumor with invasion into the perirenal soft tissue, renal pelvis and segmental renal vein and renal sinus.  Current therapy: Axitinib 5 mg twice a day with Pembrolizumab 200 mg every 3 weeks started on September 18, 2019.  He is here for cycle 5 of therapy.  Interim History:  Marcus Callahan returns today for a repeat evaluation.  Since the last visit, he reports no major changes in his health. He continues to tolerate current therapy without any complaints. Denies any nausea, vomiting or abdominal pain. He denies any fatigue tiredness. Denies any diarrhea or changes in bowel habits. Continues to work full-time without any decline in ability to do so.     Medications: Reviewed without changes. Current Outpatient Medications  Medication Sig Dispense Refill  . amLODipine (NORVASC) 10 MG tablet Take 1 tablet (10 mg total) by mouth daily. 90 tablet 2  . INLYTA 5 MG tablet TAKE 1 TABLET TWICE A DAY 60 tablet 0  . lisinopril-hydrochlorothiazide (ZESTORETIC) 20-25 MG tablet TAKE 1 TABLET BY MOUTH DAILY 90 tablet 1  . metoprolol succinate (TOPROL-XL) 100 MG 24 hr tablet Take 1 tablet (100 mg total) by mouth daily. Take with or immediately following a meal. 90 tablet 2   No current facility-administered medications for this visit.     Allergies:  Allergies  Allergen Reactions  . Codeine Other (See Comments)    Nose bleeds      Physical Exam:   Blood pressure (!) 144/102, pulse 74, temperature (!) 97.5 F (36.4 C), temperature source Temporal, resp. rate 18, height 5'  10" (1.778 m), weight 240 lb 4.8 oz (109 kg), SpO2 100 %.    ECOG: 0   General appearance: Comfortable appearing without any discomfort Head: Normocephalic without any trauma Oropharynx: Mucous membranes are moist and pink without any thrush or ulcers. Eyes: Pupils are equal and round reactive to light. Lymph nodes: No cervical, supraclavicular, inguinal or axillary lymphadenopathy.   Heart:regular rate and rhythm.  S1 and S2 without leg edema. Lung: Clear without any rhonchi or wheezes.  No dullness to percussion. Abdomin: Soft, nontender, nondistended with good bowel sounds.  No hepatosplenomegaly. Musculoskeletal: No joint deformity or effusion.  Full range of motion noted. Neurological: No deficits noted on motor, sensory and deep tendon reflex exam. Skin: No petechial rash or dryness.  Appeared moist.         Lab Results: Lab Results  Component Value Date   WBC 6.9 11/20/2019   HGB 13.1 11/20/2019   HCT 41.5 11/20/2019   MCV 77.0 (L) 11/20/2019   PLT 337 11/20/2019     Chemistry      Component Value Date/Time   NA 138 11/20/2019 0757   NA 141 09/28/2019 1517   K 3.9 11/20/2019 0757   CL 104 11/20/2019 0757   CO2 20 (L) 11/20/2019 0757   BUN 21 (H) 11/20/2019 0757   BUN 22 09/28/2019 1517   CREATININE 1.59 (H) 11/20/2019 0757      Component Value Date/Time   CALCIUM 9.5 11/20/2019 0757   ALKPHOS 97 11/20/2019 0757   AST 30 11/20/2019 0757   ALT  41 11/20/2019 0757   BILITOT 0.3 11/20/2019 0757       IMPRESSION: 1. Mild interval progression in diffuse liver metastases. No other sites of malignancy identified. 2. Mild diffuse bladder wall thickening, which may be due to chronic bladder outlet obstruction or cystitis. 3. Small bilateral inguinal hernias containing only fat.    Impression and Plan:  49 year old man with:  1.  Stage IV clear-cell renal cell carcinoma diagnosed in February 2021 with hepatic involvement.  He is currently on  pembrolizumab and axitinib without any new complications. CT scan obtained on 12/04/2019 showed no major changes in his imaging studies with mild increase in his hepatic metastasis although could reflect pseudoprogression related to immunotherapy and oral targeted therapy. Risks and benefits associated with continuing this treatment were reviewed. Potential complications that include immune mediated complications, diarrhea as well as increased fatigue were reviewed. I recommended continuing treatment at this time and repeat imaging studies in 3 months. If disease progression is noted in the time different salvage therapy will be needed.  2.  Hypertension: We will continue to monitor on axitinib. Mild elevation noted today.  3.  Immune mediated complications. He is not experiencing any immune mediated complications at this time. These include pneumonitis, colitis and thyroid disease.  4.  Nausea prophylaxis: Antiemetics with Compazine is available to him. No nausea or vomiting reported..  5.  IV access: No issues reported with peripheral veins at this time.  6.  Follow-up: He will return in 3 weeks for repeat Pembrolizumab and evaluation.   30 minutes were spent on this visit. Time was dedicated to reviewing imaging studies, discussing treatment options and addressing complications related to therapy.    Zola Button, MD 6/11/202111:28 AM

## 2019-12-11 NOTE — Patient Instructions (Signed)
[facility] Discharge Instructions for Patients Receiving Chemotherapy  Today you received the following immunotherapy agents:  Keytruda  To help prevent nausea and vomiting after your treatment, we encourage you to take your nausea medication as prescribed. If you develop nausea and vomiting that is not controlled by your nausea medication, call the clinic.   BELOW ARE SYMPTOMS THAT SHOULD BE REPORTED IMMEDIATELY:  *FEVER GREATER THAN 100.5 F  *CHILLS WITH OR WITHOUT FEVER  NAUSEA AND VOMITING THAT IS NOT CONTROLLED WITH YOUR NAUSEA MEDICATION  *UNUSUAL SHORTNESS OF BREATH  *UNUSUAL BRUISING OR BLEEDING  TENDERNESS IN MOUTH AND THROAT WITH OR WITHOUT PRESENCE OF ULCERS  *URINARY PROBLEMS  *BOWEL PROBLEMS  UNUSUAL RASH Items with * indicate a potential emergency and should be followed up as soon as possible.  Feel free to call the clinic should you have any questions or concerns. The clinic phone number is [phone].  Please show the [facility] at check-in to the Emergency Department and triage nurse.

## 2019-12-17 ENCOUNTER — Encounter: Payer: Self-pay | Admitting: Emergency Medicine

## 2019-12-17 ENCOUNTER — Other Ambulatory Visit: Payer: Self-pay | Admitting: *Deleted

## 2019-12-17 DIAGNOSIS — C642 Malignant neoplasm of left kidney, except renal pelvis: Secondary | ICD-10-CM

## 2019-12-17 MED ORDER — INLYTA 5 MG PO TABS: 5.0000 mg | ORAL_TABLET | Freq: Two times a day (BID) | ORAL | 1 refills | Status: DC

## 2019-12-17 NOTE — Progress Notes (Signed)
Dr. Alen Blew approved refill via paper to [facility] for Inlyta 5 mg

## 2020-01-01 ENCOUNTER — Inpatient Hospital Stay (HOSPITAL_BASED_OUTPATIENT_CLINIC_OR_DEPARTMENT_OTHER): Payer: BC Managed Care – PPO | Admitting: Oncology

## 2020-01-01 ENCOUNTER — Inpatient Hospital Stay: Payer: BC Managed Care – PPO

## 2020-01-01 ENCOUNTER — Inpatient Hospital Stay: Payer: BC Managed Care – PPO | Attending: Oncology

## 2020-01-01 ENCOUNTER — Other Ambulatory Visit: Payer: Self-pay

## 2020-01-01 VITALS — BP 135/96 | HR 73 | Temp 97.7°F | Resp 18 | Ht 70.0 in | Wt 238.4 lb

## 2020-01-01 DIAGNOSIS — C642 Malignant neoplasm of left kidney, except renal pelvis: Secondary | ICD-10-CM

## 2020-01-01 DIAGNOSIS — Z5112 Encounter for antineoplastic immunotherapy: Secondary | ICD-10-CM | POA: Diagnosis not present

## 2020-01-01 DIAGNOSIS — I10 Essential (primary) hypertension: Secondary | ICD-10-CM | POA: Insufficient documentation

## 2020-01-01 DIAGNOSIS — C787 Secondary malignant neoplasm of liver and intrahepatic bile duct: Secondary | ICD-10-CM | POA: Insufficient documentation

## 2020-01-01 DIAGNOSIS — Z79899 Other long term (current) drug therapy: Secondary | ICD-10-CM | POA: Insufficient documentation

## 2020-01-01 LAB — CMP (CANCER CENTER ONLY)
ALT: 21 U/L (ref 0–44)
AST: 20 U/L (ref 15–41)
Albumin: 4 g/dL (ref 3.5–5.0)
Alkaline Phosphatase: 76 U/L (ref 38–126)
Anion gap: 13 (ref 5–15)
BUN: 14 mg/dL (ref 6–20)
CO2: 18 mmol/L — ABNORMAL LOW (ref 22–32)
Calcium: 9.4 mg/dL (ref 8.9–10.3)
Chloride: 108 mmol/L (ref 98–111)
Creatinine: 1.43 mg/dL — ABNORMAL HIGH (ref 0.61–1.24)
GFR, Est AFR Am: >60 mL/min (ref >60)
GFR, Estimated: 56 mL/min — ABNORMAL LOW (ref >60)
Glucose, Bld: 130 mg/dL — ABNORMAL HIGH (ref 70–99)
Potassium: 3.6 mmol/L (ref 3.5–5.1)
Sodium: 139 mmol/L (ref 135–145)
Total Bilirubin: 0.3 mg/dL (ref 0.3–1.2)
Total Protein: 7.8 g/dL (ref 6.5–8.1)

## 2020-01-01 LAB — CBC WITH DIFFERENTIAL (CANCER CENTER ONLY)
Abs Immature Granulocytes: 0.02 10*3/uL (ref 0.00–0.07)
Basophils Absolute: 0 10*3/uL (ref 0.0–0.1)
Basophils Relative: 1 %
Eosinophils Absolute: 0.1 10*3/uL (ref 0.0–0.5)
Eosinophils Relative: 1 %
HCT: 36.4 % — ABNORMAL LOW (ref 39.0–52.0)
Hemoglobin: 11.6 g/dL — ABNORMAL LOW (ref 13.0–17.0)
Immature Granulocytes: 0 %
Lymphocytes Relative: 29 %
Lymphs Abs: 2.4 10*3/uL (ref 0.7–4.0)
MCH: 24.4 pg — ABNORMAL LOW (ref 26.0–34.0)
MCHC: 31.9 g/dL (ref 30.0–36.0)
MCV: 76.5 fL — ABNORMAL LOW (ref 80.0–100.0)
Monocytes Absolute: 0.6 10*3/uL (ref 0.1–1.0)
Monocytes Relative: 8 %
Neutro Abs: 5.1 10*3/uL (ref 1.7–7.7)
Neutrophils Relative %: 61 %
Platelet Count: 344 10*3/uL (ref 150–400)
RBC: 4.76 MIL/uL (ref 4.22–5.81)
RDW: 17.2 % — ABNORMAL HIGH (ref 11.5–15.5)
WBC Count: 8.2 10*3/uL (ref 4.0–10.5)
nRBC: 0 % (ref 0.0–0.2)

## 2020-01-01 LAB — TSH: TSH: 1.323 u[IU]/mL (ref 0.320–4.118)

## 2020-01-01 MED ORDER — SODIUM CHLORIDE 0.9 % IV SOLN
Freq: Once | INTRAVENOUS | Status: AC
  Filled 2020-01-01: qty 250

## 2020-01-01 MED ORDER — SODIUM CHLORIDE 0.9 % IV SOLN
200.0000 mg | Freq: Once | INTRAVENOUS | Status: AC
  Administered 2020-01-01: 200 mg via INTRAVENOUS
  Filled 2020-01-01: qty 8

## 2020-01-01 NOTE — Progress Notes (Signed)
Hematology and Oncology Follow Up Visit  Marcus Callahan [id number] Dec 10, 1970 49 y.o. 01/01/2020 10:47 AM [name], [place], [facility], *   Principle Diagnosis: 49 year old man with kidney cancer diagnosed in February 2021.  He was found to have stage IV clear-cell with hepatic involvement.  Prior Therapy:  He is status post robotic assisted laparoscopic left radical nephrectomy completed on August 26, 2019.  Final pathology indicated T3a tumor with invasion into the perirenal soft tissue, renal pelvis and segmental renal vein and renal sinus.  Current therapy: Axitinib 5 mg twice a day with Pembrolizumab 200 mg every 3 weeks started on September 18, 2019.  He is here for cycle 6 of therapy.  Interim History:  Marcus Callahan presents today for a follow-up evaluation.  Since the last visit, he reports feeling well without any recent complaints.  Denied any excessive fatigue tiredness or dysphagia.  He denies any difficulty ambulating or excessive fatigue.  He denies any shortness of breath, wheezing or hemoptysis.  He denies any diarrhea or weight loss.  His performance status and quality of life remain excellent and he continues to work full-time.     Medications: Updated on review. Current Outpatient Medications  Medication Sig Dispense Refill   amLODipine (NORVASC) 10 MG tablet Take 1 tablet (10 mg total) by mouth daily. 90 tablet 2   INLYTA 5 MG tablet Take 1 tablet (5 mg total) by mouth 2 (two) times daily. 60 tablet 1   lisinopril-hydrochlorothiazide (ZESTORETIC) 20-25 MG tablet TAKE 1 TABLET BY MOUTH DAILY 90 tablet 1   metoprolol succinate (TOPROL-XL) 100 MG 24 hr tablet Take 1 tablet (100 mg total) by mouth daily. Take with or immediately following a meal. 90 tablet 2   No current facility-administered medications for this visit.     Allergies:  Allergies  Allergen Reactions   Codeine Other (See Comments)    Nose bleeds      Physical  Exam:   Blood pressure (!) 135/96, pulse 73, temperature 97.7 F (36.5 C), temperature source Temporal, resp. rate 18, height 5\' 10"  (1.778 m), weight 238 lb 6.4 oz (108.1 kg), SpO2 100 %.     ECOG: 0     General appearance: Alert, awake without any distress. Head: Atraumatic without abnormalities Oropharynx: Without any thrush or ulcers. Eyes: No scleral icterus. Lymph nodes: No lymphadenopathy noted in the cervical, supraclavicular, or axillary nodes Heart:regular rate and rhythm, without any murmurs or gallops.   Lung: Clear to auscultation without any rhonchi, wheezes or dullness to percussion. Abdomin: Soft, nontender without any shifting dullness or ascites. Musculoskeletal: No clubbing or cyanosis. Neurological: No motor or sensory deficits. Skin: No rashes or lesions.          Lab Results: Lab Results  Component Value Date   WBC 7.5 12/11/2019   HGB 12.2 (L) 12/11/2019   HCT 37.6 (L) 12/11/2019   MCV 75.4 (L) 12/11/2019   PLT 320 12/11/2019     Chemistry      Component Value Date/Time   NA 139 12/11/2019 1114   NA 141 09/28/2019 1517   K 3.7 12/11/2019 1114   CL 107 12/11/2019 1114   CO2 19 (L) 12/11/2019 1114   BUN 16 12/11/2019 1114   BUN 22 09/28/2019 1517   CREATININE 1.44 (H) 12/11/2019 1114      Component Value Date/Time   CALCIUM 9.8 12/11/2019 1114   ALKPHOS 78 12/11/2019 1114   AST 20 12/11/2019 1114   ALT 20 12/11/2019 1114  BILITOT 0.4 12/11/2019 1114          Impression and Plan:  49 year old man with:  1.  Clear-cell renal cell carcinoma diagnosed in February 2021.  He was found to have stage IV with hepatic involvement.  He continues to tolerate current therapy with axitinib and pembrolizumab without any major complications.  Risks and benefits of continuing this therapy were reviewed.  Alternative options would be combination immunotherapy utilizing nivolumab ipilimumab.  We recommended continuing this regimen for the  time being and will repeat imaging studies in the next 2 months for a follow-up and determine next course of action pending these results.  He is agreeable to continue at this time.  2.  Hypertension: His blood pressure under reasonable control.  We will continue to monitor on axitinib.  3.  Immune mediated complications.  Potential complications including pneumonitis, colitis and thyroid disease were reiterated.  4.  Nausea prophylaxis: No nausea or vomiting reported at this time.  5.  IV access: No issues reported with his peripheral veins.  Port-A-Cath option has been deferred.  6.  Follow-up:  in 3 weeks for a follow-up visit next cycle of therapy.   30 minutes were dedicated to this encounter.  The time was spent on reviewing his disease status, treatment options and addressing complications related to cancer and cancer therapy.    Zola Button, MD 7/2/202110:47 AM

## 2020-01-01 NOTE — Patient Instructions (Signed)
[facility] Discharge Instructions for Patients Receiving Chemotherapy  Today you received the following chemotherapy agents:  Keytruda.  To help prevent nausea and vomiting after your treatment, we encourage you to take your nausea medication as directed.   If you develop nausea and vomiting that is not controlled by your nausea medication, call the clinic.   BELOW ARE SYMPTOMS THAT SHOULD BE REPORTED IMMEDIATELY:  *FEVER GREATER THAN 100.5 F  *CHILLS WITH OR WITHOUT FEVER  NAUSEA AND VOMITING THAT IS NOT CONTROLLED WITH YOUR NAUSEA MEDICATION  *UNUSUAL SHORTNESS OF BREATH  *UNUSUAL BRUISING OR BLEEDING  TENDERNESS IN MOUTH AND THROAT WITH OR WITHOUT PRESENCE OF ULCERS  *URINARY PROBLEMS  *BOWEL PROBLEMS  UNUSUAL RASH Items with * indicate a potential emergency and should be followed up as soon as possible.  Feel free to call the clinic should you have any questions or concerns. The clinic phone number is [phone].  Please show the CHEMO ALERT CARD at check-in to the Emergency Department and triage nurse.    

## 2020-01-05 ENCOUNTER — Telehealth: Payer: Self-pay | Admitting: Oncology

## 2020-01-05 DIAGNOSIS — C642 Malignant neoplasm of left kidney, except renal pelvis: Secondary | ICD-10-CM | POA: Diagnosis not present

## 2020-01-05 NOTE — Telephone Encounter (Signed)
Scheduled per los, patient has been called and notified. 

## 2020-01-12 DIAGNOSIS — C642 Malignant neoplasm of left kidney, except renal pelvis: Secondary | ICD-10-CM | POA: Diagnosis not present

## 2020-01-12 DIAGNOSIS — C787 Secondary malignant neoplasm of liver and intrahepatic bile duct: Secondary | ICD-10-CM | POA: Diagnosis not present

## 2020-01-21 ENCOUNTER — Inpatient Hospital Stay: Payer: BC Managed Care – PPO

## 2020-01-21 ENCOUNTER — Inpatient Hospital Stay (HOSPITAL_BASED_OUTPATIENT_CLINIC_OR_DEPARTMENT_OTHER): Payer: BC Managed Care – PPO | Admitting: Oncology

## 2020-01-21 ENCOUNTER — Other Ambulatory Visit: Payer: Self-pay

## 2020-01-21 VITALS — BP 136/96 | HR 69 | Temp 97.7°F | Resp 18 | Ht 70.0 in | Wt 237.5 lb

## 2020-01-21 DIAGNOSIS — C787 Secondary malignant neoplasm of liver and intrahepatic bile duct: Secondary | ICD-10-CM | POA: Diagnosis not present

## 2020-01-21 DIAGNOSIS — I10 Essential (primary) hypertension: Secondary | ICD-10-CM | POA: Diagnosis not present

## 2020-01-21 DIAGNOSIS — C642 Malignant neoplasm of left kidney, except renal pelvis: Secondary | ICD-10-CM | POA: Diagnosis not present

## 2020-01-21 DIAGNOSIS — Z79899 Other long term (current) drug therapy: Secondary | ICD-10-CM | POA: Diagnosis not present

## 2020-01-21 DIAGNOSIS — Z5112 Encounter for antineoplastic immunotherapy: Secondary | ICD-10-CM | POA: Diagnosis not present

## 2020-01-21 LAB — CBC WITH DIFFERENTIAL (CANCER CENTER ONLY)
Abs Immature Granulocytes: 0.02 10*3/uL (ref 0.00–0.07)
Basophils Absolute: 0 10*3/uL (ref 0.0–0.1)
Basophils Relative: 1 %
Eosinophils Absolute: 0.1 10*3/uL (ref 0.0–0.5)
Eosinophils Relative: 2 %
HCT: 36.6 % — ABNORMAL LOW (ref 39.0–52.0)
Hemoglobin: 11.9 g/dL — ABNORMAL LOW (ref 13.0–17.0)
Immature Granulocytes: 0 %
Lymphocytes Relative: 30 %
Lymphs Abs: 2.2 10*3/uL (ref 0.7–4.0)
MCH: 24.7 pg — ABNORMAL LOW (ref 26.0–34.0)
MCHC: 32.5 g/dL (ref 30.0–36.0)
MCV: 75.9 fL — ABNORMAL LOW (ref 80.0–100.0)
Monocytes Absolute: 0.8 10*3/uL (ref 0.1–1.0)
Monocytes Relative: 11 %
Neutro Abs: 4.2 10*3/uL (ref 1.7–7.7)
Neutrophils Relative %: 56 %
Platelet Count: 369 10*3/uL (ref 150–400)
RBC: 4.82 MIL/uL (ref 4.22–5.81)
RDW: 16.4 % — ABNORMAL HIGH (ref 11.5–15.5)
WBC Count: 7.4 10*3/uL (ref 4.0–10.5)
nRBC: 0 % (ref 0.0–0.2)

## 2020-01-21 LAB — CMP (CANCER CENTER ONLY)
ALT: 25 U/L (ref 0–44)
AST: 27 U/L (ref 15–41)
Albumin: 4.2 g/dL (ref 3.5–5.0)
Alkaline Phosphatase: 77 U/L (ref 38–126)
Anion gap: 12 (ref 5–15)
BUN: 18 mg/dL (ref 6–20)
CO2: 20 mmol/L — ABNORMAL LOW (ref 22–32)
Calcium: 9.8 mg/dL (ref 8.9–10.3)
Chloride: 105 mmol/L (ref 98–111)
Creatinine: 1.44 mg/dL — ABNORMAL HIGH (ref 0.61–1.24)
GFR, Est AFR Am: >60 mL/min (ref >60)
GFR, Estimated: 56 mL/min — ABNORMAL LOW (ref >60)
Glucose, Bld: 87 mg/dL (ref 70–99)
Potassium: 3.7 mmol/L (ref 3.5–5.1)
Sodium: 137 mmol/L (ref 135–145)
Total Bilirubin: 0.5 mg/dL (ref 0.3–1.2)
Total Protein: 8.1 g/dL (ref 6.5–8.1)

## 2020-01-21 LAB — TSH: TSH: 1.409 u[IU]/mL (ref 0.320–4.118)

## 2020-01-21 MED ORDER — SODIUM CHLORIDE 0.9 % IV SOLN
200.0000 mg | Freq: Once | INTRAVENOUS | Status: AC
  Administered 2020-01-21: 200 mg via INTRAVENOUS
  Filled 2020-01-21: qty 8

## 2020-01-21 MED ORDER — SODIUM CHLORIDE 0.9 % IV SOLN
Freq: Once | INTRAVENOUS | Status: AC
  Filled 2020-01-21: qty 250

## 2020-01-21 NOTE — Progress Notes (Signed)
Hematology and Oncology Follow Up Visit  Marcus Callahan [id number] 09/01/1970 49 y.o. 01/21/2020 12:32 PM Marcus Callahan, MDSagardia, [facility], [place]   Principle Diagnosis: 49 year old man with stage IV clear-cell renal cell carcinoma with hepatic metastasis diagnosed in February 2021.    Prior Therapy:  He is status post robotic assisted laparoscopic left radical nephrectomy completed on August 26, 2019.  Final pathology indicated T3a tumor with invasion into the perirenal soft tissue, renal pelvis and segmental renal vein and renal sinus.  Current therapy: Axitinib 5 mg twice a day with Pembrolizumab 200 mg every 3 weeks started on September 18, 2019.  He is here for cycle 7 of therapy.  Interim History:  Marcus Callahan returns today for a follow-up visit.  Since the last visit, he reports no major complications at this time.  He denies any nausea, vomiting or abdominal pain.  Recent hospitalization illnesses denies any diarrhea or oral ulcers or pain.  His performance status quality of life remained excellent.  Continues to work full-time at this time.     Medications: Reviewed without changes. Current Outpatient Medications  Medication Sig Dispense Refill  . amLODipine (NORVASC) 10 MG tablet Take 1 tablet (10 mg total) by mouth daily. 90 tablet 2  . INLYTA 5 MG tablet Take 1 tablet (5 mg total) by mouth 2 (two) times daily. 60 tablet 1  . lisinopril-hydrochlorothiazide (ZESTORETIC) 20-25 MG tablet TAKE 1 TABLET BY MOUTH DAILY 90 tablet 1  . metoprolol succinate (TOPROL-XL) 100 MG 24 hr tablet Take 1 tablet (100 mg total) by mouth daily. Take with or immediately following a meal. 90 tablet 2   No current facility-administered medications for this visit.   Facility-Administered Medications Ordered in Other Visits  Medication Dose Route Frequency Provider Last Rate Last Admin  . pembrolizumab (KEYTRUDA) 200 mg in sodium chloride 0.9 % 50 mL chemo infusion  200 mg Intravenous  Once Marcus Portela, MD         Allergies:  Allergies  Allergen Reactions  . Codeine Other (See Comments)    Nose bleeds      Physical Exam:   Blood pressure (!) 136/96, pulse 69, temperature 97.7 F (36.5 C), temperature source Temporal, resp. rate 18, height 5\' 10"  (1.778 m), weight (!) 237 lb 8 oz (107.7 kg), SpO2 100 %.     ECOG: 0      General appearance: Comfortable appearing without any discomfort Head: Normocephalic without any trauma Oropharynx: Mucous membranes are moist and pink without any thrush or ulcers. Eyes: Pupils are equal and round reactive to light. Lymph nodes: No cervical, supraclavicular, inguinal or axillary lymphadenopathy.   Heart:regular rate and rhythm.  S1 and S2 without leg edema. Lung: Clear without any rhonchi or wheezes.  No dullness to percussion. Abdomin: Soft, nontender, nondistended with good bowel sounds.  No hepatosplenomegaly. Musculoskeletal: No joint deformity or effusion.  Full range of motion noted. Neurological: No deficits noted on motor, sensory and deep tendon reflex exam. Skin: No petechial rash or dryness.  Appeared moist.            Lab Results: Lab Results  Component Value Date   WBC 7.4 01/21/2020   HGB 11.9 (L) 01/21/2020   HCT 36.6 (L) 01/21/2020   MCV 75.9 (L) 01/21/2020   PLT 369 01/21/2020     Chemistry      Component Value Date/Time   NA 137 01/21/2020 1131   NA 141 09/28/2019 1517   K 3.7 01/21/2020 1131  CL 105 01/21/2020 1131   CO2 20 (L) 01/21/2020 1131   BUN 18 01/21/2020 1131   BUN 22 09/28/2019 1517   CREATININE 1.44 (H) 01/21/2020 1131      Component Value Date/Time   CALCIUM 9.8 01/21/2020 1131   ALKPHOS 77 01/21/2020 1131   AST 27 01/21/2020 1131   ALT 25 01/21/2020 1131   BILITOT 0.5 01/21/2020 1131          Impression and Plan:  49 year old man with:  1.  Stage IV clear-cell renal cell carcinoma with hepatic metastasis diagnosed in February 2021.    He  does not report any complication related to his current regimen which she has tolerated reasonably well.  Risks and benefits of continuing this treatment long-term were discussed.  Potential complications including hypertension and immune mediated complications were reviewed.  The plan is to repeat imaging studies in September 2021.  2.  Hypertension: No issues reported with his blood pressure at this time.  Manageable without any recent complaints.  3.  Immune mediated complications.  I continue to reiterate and educate him about potential complications.  These include thyroid disease, pneumonitis, colitis, hepatitis and hypophysitis.  4.  Nausea prophylaxis: Antiemetics are available to him.  No nausea or vomiting reported.  5.  IV access: Peripheral veins are currently in use without any issues.  No Port-A-Cath is needed.  6.  Follow-up: He will return for the next cycle of therapy in 3 weeks.   30 minutes were spent on this visit.  The time was spent on reviewing his disease status, discussing treatment complications and future plan of care reviewed.    Marcus Button, MD 7/22/202112:32 PM

## 2020-01-21 NOTE — Patient Instructions (Signed)
[facility] Discharge Instructions for Patients Receiving Chemotherapy  Today you received the following chemotherapy agents:  Keytruda.  To help prevent nausea and vomiting after your treatment, we encourage you to take your nausea medication as directed.   If you develop nausea and vomiting that is not controlled by your nausea medication, call the clinic.   BELOW ARE SYMPTOMS THAT SHOULD BE REPORTED IMMEDIATELY:  *FEVER GREATER THAN 100.5 F  *CHILLS WITH OR WITHOUT FEVER  NAUSEA AND VOMITING THAT IS NOT CONTROLLED WITH YOUR NAUSEA MEDICATION  *UNUSUAL SHORTNESS OF BREATH  *UNUSUAL BRUISING OR BLEEDING  TENDERNESS IN MOUTH AND THROAT WITH OR WITHOUT PRESENCE OF ULCERS  *URINARY PROBLEMS  *BOWEL PROBLEMS  UNUSUAL RASH Items with * indicate a potential emergency and should be followed up as soon as possible.  Feel free to call the clinic should you have any questions or concerns. The clinic phone number is [phone].  Please show the CHEMO ALERT CARD at check-in to the Emergency Department and triage nurse.    

## 2020-02-01 ENCOUNTER — Telehealth: Payer: Self-pay | Admitting: Oncology

## 2020-02-01 NOTE — Telephone Encounter (Signed)
Scheduled per 07/22 los, patient has been called and voicemail was left. 

## 2020-02-11 ENCOUNTER — Encounter: Payer: Self-pay | Admitting: Emergency Medicine

## 2020-02-11 ENCOUNTER — Ambulatory Visit (INDEPENDENT_AMBULATORY_CARE_PROVIDER_SITE_OTHER): Payer: BC Managed Care – PPO | Admitting: Emergency Medicine

## 2020-02-11 ENCOUNTER — Other Ambulatory Visit: Payer: Self-pay

## 2020-02-11 VITALS — BP 127/82 | HR 64 | Temp 98.0°F | Resp 16 | Ht 70.0 in | Wt 237.0 lb

## 2020-02-11 DIAGNOSIS — C642 Malignant neoplasm of left kidney, except renal pelvis: Secondary | ICD-10-CM

## 2020-02-11 DIAGNOSIS — I10 Essential (primary) hypertension: Secondary | ICD-10-CM | POA: Diagnosis not present

## 2020-02-11 NOTE — Assessment & Plan Note (Signed)
Well-controlled hypertension.  Continue present medications.  No changes. Follow-up in 6 months. 

## 2020-02-11 NOTE — Progress Notes (Signed)
Marcus Callahan 49 y.o.   Chief Complaint  Patient presents with   Hypertension    FOLLOW UP - per patient no refill needed    HISTORY OF PRESENT ILLNESS: This is a 49 y.o. male with history of hypertension here for follow-up. Presently taking amlodipine 10 mg daily, Zestoretic 20-25 mg daily, and metoprolol succinate 100 mg daily. Has no complaints or medical concerns today. BP Readings from Last 3 Encounters:  02/11/20 127/82  01/21/20 (!) 136/96  01/01/20 (!) 135/96  Has history of left kidney cancer, status post nephrectomy.  Sees oncologist on a regular basis.  Doing well.  HPI   Prior to Admission medications   Medication Sig Start Date End Date Taking? Authorizing Provider  INLYTA 5 MG tablet Take 1 tablet (5 mg total) by mouth 2 (two) times daily. 12/17/19  Yes Wyatt Portela, MD  lisinopril-hydrochlorothiazide (ZESTORETIC) 20-25 MG tablet TAKE 1 TABLET BY MOUTH DAILY 10/31/19  Yes Giordan Fordham, Ines Bloomer, MD  amLODipine (NORVASC) 10 MG tablet Take 1 tablet (10 mg total) by mouth daily. 11/23/19 12/23/19  [name], MD  metoprolol succinate (TOPROL-XL) 100 MG 24 hr tablet Take 1 tablet (100 mg total) by mouth daily. Take with or immediately following a meal. 11/23/19 12/23/19  [name], MD    Allergies  Allergen Reactions   Codeine Other (See Comments)    Nose bleeds    Patient Active Problem List   Diagnosis Date Noted   Goals of care, counseling/discussion 09/08/2019   Kidney cancer, primary, with metastasis from kidney to other site [facility]) 09/08/2019   Renal mass 08/26/2019   Essential hypertension 11/18/2018    Past Medical History:  Diagnosis Date   Allergy    no diagnosis per pt    Hypertension     Past Surgical History:  Procedure Laterality Date   CYSTOSCOPY N/A 08/26/2019   Procedure: CYSTOSCOPY FLEXIBLE;  Surgeon: Ceasar Mons, MD;  Location: [facility];  Service: Urology;  Laterality: N/A;   ROBOT  ASSISTED LAPAROSCOPIC NEPHRECTOMY Left 08/26/2019   Procedure: XI ROBOTIC ASSISTED LAPAROSCOPIC NEPHRECTOMY;  Surgeon: Ceasar Mons, MD;  Location: [facility];  Service: Urology;  Laterality: Left;   surgery as a child     thinks matbe was a hernia repair as a premature baby     Social History   Socioeconomic History   Marital status: Single    Spouse name: Not on file   Number of children: Not on file   Years of education: Not on file   Highest education level: Not on file  Occupational History   Not on file  Tobacco Use   Smoking status: Never Smoker   Smokeless tobacco: Never Used  Vaping Use   Vaping Use: Never used  Substance and Sexual Activity   Alcohol use: Yes    Comment: occassionaly   Drug use: Never   Sexual activity: Not on file  Other Topics Concern   Not on file  Social History Narrative   Not on file   Social Determinants of Health   Financial Resource Strain:    Difficulty of Paying Living Expenses:   Food Insecurity:    Worried About Charity fundraiser in the Last Year:    Arboriculturist in the Last Year:   Transportation Needs:    Film/video editor (Medical):    Lack of Transportation (Non-Medical):   Physical Activity:    Days of Exercise per Week:  Minutes of Exercise per Session:   Stress:    Feeling of Stress :   Social Connections:    Frequency of Communication with Friends and Family:    Frequency of Social Gatherings with Friends and Family:    Attends Religious Services:    Active Member of Clubs or Organizations:    Attends Music therapist:    Marital Status:   Intimate Partner Violence:    Fear of Current or Ex-Partner:    Emotionally Abused:    Physically Abused:    Sexually Abused:     Family History  Problem Relation Age of Onset   Colon polyps Mother    Colon cancer Neg Hx    Esophageal cancer Neg Hx    Prostate cancer Neg Hx    Rectal cancer Neg Hx        Review of Systems  Constitutional: Negative.  Negative for chills and fever.  HENT: Negative.  Negative for congestion and sore throat.   Respiratory: Negative.  Negative for cough and shortness of breath.   Cardiovascular: Negative.  Negative for chest pain and palpitations.  Gastrointestinal: Negative.  Negative for abdominal pain, diarrhea, nausea and vomiting.  Genitourinary: Negative.  Negative for dysuria and hematuria.  Musculoskeletal: Negative.  Negative for back pain, myalgias and neck pain.  Skin: Negative.  Negative for rash.  Neurological: Negative.  Negative for dizziness and headaches.  Endo/Heme/Allergies: Negative.   All other systems reviewed and are negative.  Today's Vitals   02/11/20 1630  BP: 127/82  Pulse: 64  Resp: 16  Temp: 98 F (36.7 C)  TempSrc: Temporal  SpO2: 98%  Weight: 237 lb (107.5 kg)  Height: 5\' 10"  (1.778 m)   Body mass index is 34.01 kg/m.   Physical Exam Vitals reviewed.  Constitutional:      Appearance: Normal appearance.  HENT:     Head: Normocephalic.  Eyes:     Extraocular Movements: Extraocular movements intact.     Pupils: Pupils are equal, round, and reactive to light.  Cardiovascular:     Rate and Rhythm: Normal rate and regular rhythm.     Pulses: Normal pulses.     Heart sounds: Normal heart sounds.  Pulmonary:     Effort: Pulmonary effort is normal.     Breath sounds: Normal breath sounds.  Abdominal:     Palpations: Abdomen is soft.     Tenderness: There is no abdominal tenderness.  Musculoskeletal:        General: Normal range of motion.     Cervical back: Normal range of motion and neck supple.  Skin:    General: Skin is warm and dry.     Capillary Refill: Capillary refill takes less than 2 seconds.  Neurological:     General: No focal deficit present.     Mental Status: He is alert and oriented to person, place, and time.  Psychiatric:        Mood and Affect: Mood normal.        Behavior: Behavior  normal.      ASSESSMENT & PLAN: Essential hypertension Well-controlled hypertension.  Continue present medications.  No changes.  Follow-up in 6 months.  Marcus Callahan was seen today for hypertension.  Diagnoses and all orders for this visit:  Essential hypertension  Primary malignant neoplasm of left kidney with metastasis from kidney to other site [facility])    Patient Instructions       If you have lab work done today you will  be contacted with your lab results within the next 2 weeks.  If you have not heard from [place] then please contact us. The fastest way to get your results is to register for My Chart.   IF you received an x-ray today, you will receive an invoice from [facility]. Please contact [facility] at [phone] with questions or concerns regarding your invoice.   IF you received labwork today, you will receive an invoice from [facility]. Please contact [facility] at [phone] with questions or concerns regarding your invoice.   Our billing staff will not be able to assist you with questions regarding bills from these companies.  You will be contacted with the lab results as soon as they are available. The fastest way to get your results is to activate your My Chart account. Instructions are located on the last page of this paperwork. If you have not heard from [place] regarding the results in 2 weeks, please contact this office.     Hypertension, Adult High blood pressure (hypertension) is when the force of blood pumping through the arteries is too strong. The arteries are the blood vessels that carry blood from the heart throughout the body. Hypertension forces the heart to work harder to pump blood and may cause arteries to become narrow or stiff. Untreated or uncontrolled hypertension can cause a heart attack, heart failure, a stroke, kidney disease, and other problems. A blood pressure reading consists of a higher number over a lower number. Ideally, your  blood pressure should be below 120/80. The first ("top") number is called the systolic pressure. It is a measure of the pressure in your arteries as your heart beats. The second ("bottom") number is called the diastolic pressure. It is a measure of the pressure in your arteries as the heart relaxes. What are the causes? The exact cause of this condition is not known. There are some conditions that result in or are related to high blood pressure. What increases the risk? Some risk factors for high blood pressure are under your control. The following factors may make you more likely to develop this condition:  Smoking.  Having type 2 diabetes mellitus, high cholesterol, or both.  Not getting enough exercise or physical activity.  Being overweight.  Having too much fat, sugar, calories, or salt (sodium) in your diet.  Drinking too much alcohol. Some risk factors for high blood pressure may be difficult or impossible to change. Some of these factors include:  Having chronic kidney disease.  Having a family history of high blood pressure.  Age. Risk increases with age.  Race. You may be at higher risk if you are African American.  Gender. Men are at higher risk than women before age 69. After age 58, women are at higher risk than men.  Having obstructive sleep apnea.  Stress. What are the signs or symptoms? High blood pressure may not cause symptoms. Very high blood pressure (hypertensive crisis) may cause:  Headache.  Anxiety.  Shortness of breath.  Nosebleed.  Nausea and vomiting.  Vision changes.  Severe chest pain.  Seizures. How is this diagnosed? This condition is diagnosed by measuring your blood pressure while you are seated, with your arm resting on a flat surface, your legs uncrossed, and your feet flat on the floor. The cuff of the blood pressure monitor will be placed directly against the skin of your upper arm at the level of your heart. It should be  measured at least twice using the same arm.  Certain conditions can cause a difference in blood pressure between your right and left arms. Certain factors can cause blood pressure readings to be lower or higher than normal for a short period of time:  When your blood pressure is higher when you are in a health care provider's office than when you are at home, this is called white coat hypertension. Most people with this condition do not need medicines.  When your blood pressure is higher at home than when you are in a health care provider's office, this is called masked hypertension. Most people with this condition may need medicines to control blood pressure. If you have a high blood pressure reading during one visit or you have normal blood pressure with other risk factors, you may be asked to:  Return on a different day to have your blood pressure checked again.  Monitor your blood pressure at home for 1 week or longer. If you are diagnosed with hypertension, you may have other blood or imaging tests to help your health care provider understand your overall risk for other conditions. How is this treated? This condition is treated by making healthy lifestyle changes, such as eating healthy foods, exercising more, and reducing your alcohol intake. Your health care provider may prescribe medicine if lifestyle changes are not enough to get your blood pressure under control, and if:  Your systolic blood pressure is above 130.  Your diastolic blood pressure is above 80. Your personal target blood pressure may vary depending on your medical conditions, your age, and other factors. Follow these instructions at home: Eating and drinking   Eat a diet that is high in fiber and potassium, and low in sodium, added sugar, and fat. An example eating plan is called the DASH (Dietary Approaches to Stop Hypertension) diet. To eat this way: ? Eat plenty of fresh fruits and vegetables. Try to fill one half of  your plate at each meal with fruits and vegetables. ? Eat whole grains, such as whole-wheat pasta, brown rice, or whole-grain bread. Fill about one fourth of your plate with whole grains. ? Eat or drink low-fat dairy products, such as skim milk or low-fat yogurt. ? Avoid fatty cuts of meat, processed or cured meats, and poultry with skin. Fill about one fourth of your plate with lean proteins, such as fish, chicken without skin, beans, eggs, or tofu. ? Avoid pre-made and processed foods. These tend to be higher in sodium, added sugar, and fat.  Reduce your daily sodium intake. Most people with hypertension should eat less than 1,500 mg of sodium a day.  Do not drink alcohol if: ? Your health care provider tells you not to drink. ? You are pregnant, may be pregnant, or are planning to become pregnant.  If you drink alcohol: ? Limit how much you use to:  0-1 drink a day for women.  0-2 drinks a day for men. ? Be aware of how much alcohol is in your drink. In the [place] one drink equals one 12 oz bottle of beer (355 mL), one 5 oz glass of wine (148 mL), or one 1 oz glass of hard liquor (44 mL). Lifestyle   Work with your health care provider to maintain a healthy body weight or to lose weight. Ask what an ideal weight is for you.  Get at least 30 minutes of exercise most days of the week. Activities may include walking, swimming, or biking.  Include exercise to strengthen your muscles (resistance exercise), such as  Pilates or lifting weights, as part of your weekly exercise routine. Try to do these types of exercises for 30 minutes at least 3 days a week.  Do not use any products that contain nicotine or tobacco, such as cigarettes, e-cigarettes, and chewing tobacco. If you need help quitting, ask your health care provider.  Monitor your blood pressure at home as told by your health care provider.  Keep all follow-up visits as told by your health care provider. This is  important. Medicines  Take over-the-counter and prescription medicines only as told by your health care provider. Follow directions carefully. Blood pressure medicines must be taken as prescribed.  Do not skip doses of blood pressure medicine. Doing this puts you at risk for problems and can make the medicine less effective.  Ask your health care provider about side effects or reactions to medicines that you should watch for. Contact a health care provider if you:  Think you are having a reaction to a medicine you are taking.  Have headaches that keep coming back (recurring).  Feel dizzy.  Have swelling in your ankles.  Have trouble with your vision. Get help right away if you:  Develop a severe headache or confusion.  Have unusual weakness or numbness.  Feel faint.  Have severe pain in your chest or abdomen.  Vomit repeatedly.  Have trouble breathing. Summary  Hypertension is when the force of blood pumping through your arteries is too strong. If this condition is not controlled, it may put you at risk for serious complications.  Your personal target blood pressure may vary depending on your medical conditions, your age, and other factors. For most people, a normal blood pressure is less than 120/80.  Hypertension is treated with lifestyle changes, medicines, or a combination of both. Lifestyle changes include losing weight, eating a healthy, low-sodium diet, exercising more, and limiting alcohol. This information is not intended to replace advice given to you by your health care provider. Make sure you discuss any questions you have with your health care provider. Document Revised: 02/26/2018 Document Reviewed: 02/26/2018 Elsevier Patient Education  2020 [employer]      Agustina Caroli, MD [facility]

## 2020-02-11 NOTE — Patient Instructions (Addendum)
   If you have lab work done today you will be contacted with your lab results within the next 2 weeks.  If you have not heard from us then please contact us. The fastest way to get your results is to register for My Chart.   IF you received an x-ray today, you will receive an invoice from [facility]. Please contact [facility] at [phone] with questions or concerns regarding your invoice.   IF you received labwork today, you will receive an invoice from [facility]. Please contact [facility] at [phone] with questions or concerns regarding your invoice.   Our billing staff will not be able to assist you with questions regarding bills from these companies.  You will be contacted with the lab results as soon as they are available. The fastest way to get your results is to activate your My Chart account. Instructions are located on the last page of this paperwork. If you have not heard from us regarding the results in 2 weeks, please contact this office.      Hypertension, Adult High blood pressure (hypertension) is when the force of blood pumping through the arteries is too strong. The arteries are the blood vessels that carry blood from the heart throughout the body. Hypertension forces the heart to work harder to pump blood and may cause arteries to become narrow or stiff. Untreated or uncontrolled hypertension can cause a heart attack, heart failure, a stroke, kidney disease, and other problems. A blood pressure reading consists of a higher number over a lower number. Ideally, your blood pressure should be below 120/80. The first ("top") number is called the systolic pressure. It is a measure of the pressure in your arteries as your heart beats. The second ("bottom") number is called the diastolic pressure. It is a measure of the pressure in your arteries as the heart relaxes. What are the causes? The exact cause of this condition is not known. There are some conditions  that result in or are related to high blood pressure. What increases the risk? Some risk factors for high blood pressure are under your control. The following factors may make you more likely to develop this condition:  Smoking.  Having type 2 diabetes mellitus, high cholesterol, or both.  Not getting enough exercise or physical activity.  Being overweight.  Having too much fat, sugar, calories, or salt (sodium) in your diet.  Drinking too much alcohol. Some risk factors for high blood pressure may be difficult or impossible to change. Some of these factors include:  Having chronic kidney disease.  Having a family history of high blood pressure.  Age. Risk increases with age.  Race. You may be at higher risk if you are African American.  Gender. Men are at higher risk than women before age 45. After age 65, women are at higher risk than men.  Having obstructive sleep apnea.  Stress. What are the signs or symptoms? High blood pressure may not cause symptoms. Very high blood pressure (hypertensive crisis) may cause:  Headache.  Anxiety.  Shortness of breath.  Nosebleed.  Nausea and vomiting.  Vision changes.  Severe chest pain.  Seizures. How is this diagnosed? This condition is diagnosed by measuring your blood pressure while you are seated, with your arm resting on a flat surface, your legs uncrossed, and your feet flat on the floor. The cuff of the blood pressure monitor will be placed directly against the skin of your upper arm at the level of your   heart. It should be measured at least twice using the same arm. Certain conditions can cause a difference in blood pressure between your right and left arms. Certain factors can cause blood pressure readings to be lower or higher than normal for a short period of time:  When your blood pressure is higher when you are in a health care provider's office than when you are at home, this is called white coat hypertension.  Most people with this condition do not need medicines.  When your blood pressure is higher at home than when you are in a health care provider's office, this is called masked hypertension. Most people with this condition may need medicines to control blood pressure. If you have a high blood pressure reading during one visit or you have normal blood pressure with other risk factors, you may be asked to:  Return on a different day to have your blood pressure checked again.  Monitor your blood pressure at home for 1 week or longer. If you are diagnosed with hypertension, you may have other blood or imaging tests to help your health care provider understand your overall risk for other conditions. How is this treated? This condition is treated by making healthy lifestyle changes, such as eating healthy foods, exercising more, and reducing your alcohol intake. Your health care provider may prescribe medicine if lifestyle changes are not enough to get your blood pressure under control, and if:  Your systolic blood pressure is above 130.  Your diastolic blood pressure is above 80. Your personal target blood pressure may vary depending on your medical conditions, your age, and other factors. Follow these instructions at home: Eating and drinking   Eat a diet that is high in fiber and potassium, and low in sodium, added sugar, and fat. An example eating plan is called the DASH (Dietary Approaches to Stop Hypertension) diet. To eat this way: ? Eat plenty of fresh fruits and vegetables. Try to fill one half of your plate at each meal with fruits and vegetables. ? Eat whole grains, such as whole-wheat pasta, brown rice, or whole-grain bread. Fill about one fourth of your plate with whole grains. ? Eat or drink low-fat dairy products, such as skim milk or low-fat yogurt. ? Avoid fatty cuts of meat, processed or cured meats, and poultry with skin. Fill about one fourth of your plate with lean proteins, such  as fish, chicken without skin, beans, eggs, or tofu. ? Avoid pre-made and processed foods. These tend to be higher in sodium, added sugar, and fat.  Reduce your daily sodium intake. Most people with hypertension should eat less than 1,500 mg of sodium a day.  Do not drink alcohol if: ? Your health care provider tells you not to drink. ? You are pregnant, may be pregnant, or are planning to become pregnant.  If you drink alcohol: ? Limit how much you use to:  0-1 drink a day for women.  0-2 drinks a day for men. ? Be aware of how much alcohol is in your drink. In the [place] one drink equals one 12 oz bottle of beer (355 mL), one 5 oz glass of wine (148 mL), or one 1 oz glass of hard liquor (44 mL). Lifestyle   Work with your health care provider to maintain a healthy body weight or to lose weight. Ask what an ideal weight is for you.  Get at least 30 minutes of exercise most days of the week. Activities may include walking, swimming,   or biking.  Include exercise to strengthen your muscles (resistance exercise), such as Pilates or lifting weights, as part of your weekly exercise routine. Try to do these types of exercises for 30 minutes at least 3 days a week.  Do not use any products that contain nicotine or tobacco, such as cigarettes, e-cigarettes, and chewing tobacco. If you need help quitting, ask your health care provider.  Monitor your blood pressure at home as told by your health care provider.  Keep all follow-up visits as told by your health care provider. This is important. Medicines  Take over-the-counter and prescription medicines only as told by your health care provider. Follow directions carefully. Blood pressure medicines must be taken as prescribed.  Do not skip doses of blood pressure medicine. Doing this puts you at risk for problems and can make the medicine less effective.  Ask your health care provider about side effects or reactions to medicines that you  should watch for. Contact a health care provider if you:  Think you are having a reaction to a medicine you are taking.  Have headaches that keep coming back (recurring).  Feel dizzy.  Have swelling in your ankles.  Have trouble with your vision. Get help right away if you:  Develop a severe headache or confusion.  Have unusual weakness or numbness.  Feel faint.  Have severe pain in your chest or abdomen.  Vomit repeatedly.  Have trouble breathing. Summary  Hypertension is when the force of blood pumping through your arteries is too strong. If this condition is not controlled, it may put you at risk for serious complications.  Your personal target blood pressure may vary depending on your medical conditions, your age, and other factors. For most people, a normal blood pressure is less than 120/80.  Hypertension is treated with lifestyle changes, medicines, or a combination of both. Lifestyle changes include losing weight, eating a healthy, low-sodium diet, exercising more, and limiting alcohol. This information is not intended to replace advice given to you by your health care provider. Make sure you discuss any questions you have with your health care provider. Document Revised: 02/26/2018 Document Reviewed: 02/26/2018 Elsevier Patient Education  2020 [employer]vier Inc.  

## 2020-02-12 ENCOUNTER — Inpatient Hospital Stay: Payer: BC Managed Care – PPO | Attending: Oncology

## 2020-02-12 ENCOUNTER — Other Ambulatory Visit: Payer: Self-pay

## 2020-02-12 ENCOUNTER — Inpatient Hospital Stay: Payer: BC Managed Care – PPO

## 2020-02-12 ENCOUNTER — Inpatient Hospital Stay (HOSPITAL_BASED_OUTPATIENT_CLINIC_OR_DEPARTMENT_OTHER): Payer: BC Managed Care – PPO | Admitting: Oncology

## 2020-02-12 VITALS — BP 152/97 | HR 79 | Temp 97.8°F | Resp 18 | Ht 70.0 in | Wt 239.5 lb

## 2020-02-12 DIAGNOSIS — C642 Malignant neoplasm of left kidney, except renal pelvis: Secondary | ICD-10-CM | POA: Insufficient documentation

## 2020-02-12 DIAGNOSIS — Z79899 Other long term (current) drug therapy: Secondary | ICD-10-CM | POA: Diagnosis not present

## 2020-02-12 DIAGNOSIS — I10 Essential (primary) hypertension: Secondary | ICD-10-CM | POA: Diagnosis not present

## 2020-02-12 DIAGNOSIS — Z5112 Encounter for antineoplastic immunotherapy: Secondary | ICD-10-CM | POA: Insufficient documentation

## 2020-02-12 LAB — CMP (CANCER CENTER ONLY)
ALT: 31 U/L (ref 0–44)
AST: 36 U/L (ref 15–41)
Albumin: 4.1 g/dL (ref 3.5–5.0)
Alkaline Phosphatase: 79 U/L (ref 38–126)
Anion gap: 14 (ref 5–15)
BUN: 21 mg/dL — ABNORMAL HIGH (ref 6–20)
CO2: 18 mmol/L — ABNORMAL LOW (ref 22–32)
Calcium: 10.3 mg/dL (ref 8.9–10.3)
Chloride: 106 mmol/L (ref 98–111)
Creatinine: 1.51 mg/dL — ABNORMAL HIGH (ref 0.61–1.24)
GFR, Est AFR Am: >60 mL/min (ref >60)
GFR, Estimated: 53 mL/min — ABNORMAL LOW (ref >60)
Glucose, Bld: 120 mg/dL — ABNORMAL HIGH (ref 70–99)
Potassium: 3.7 mmol/L (ref 3.5–5.1)
Sodium: 138 mmol/L (ref 135–145)
Total Bilirubin: 0.3 mg/dL (ref 0.3–1.2)
Total Protein: 7.9 g/dL (ref 6.5–8.1)

## 2020-02-12 LAB — CBC WITH DIFFERENTIAL (CANCER CENTER ONLY)
Abs Immature Granulocytes: 0.01 10*3/uL (ref 0.00–0.07)
Basophils Absolute: 0 10*3/uL (ref 0.0–0.1)
Basophils Relative: 0 %
Eosinophils Absolute: 0.1 10*3/uL (ref 0.0–0.5)
Eosinophils Relative: 1 %
HCT: 35.4 % — ABNORMAL LOW (ref 39.0–52.0)
Hemoglobin: 11.3 g/dL — ABNORMAL LOW (ref 13.0–17.0)
Immature Granulocytes: 0 %
Lymphocytes Relative: 28 %
Lymphs Abs: 2.4 10*3/uL (ref 0.7–4.0)
MCH: 25.4 pg — ABNORMAL LOW (ref 26.0–34.0)
MCHC: 31.9 g/dL (ref 30.0–36.0)
MCV: 79.6 fL — ABNORMAL LOW (ref 80.0–100.0)
Monocytes Absolute: 0.8 10*3/uL (ref 0.1–1.0)
Monocytes Relative: 9 %
Neutro Abs: 5.1 10*3/uL (ref 1.7–7.7)
Neutrophils Relative %: 62 %
Platelet Count: 377 10*3/uL (ref 150–400)
RBC: 4.45 MIL/uL (ref 4.22–5.81)
RDW: 15.9 % — ABNORMAL HIGH (ref 11.5–15.5)
WBC Count: 8.4 10*3/uL (ref 4.0–10.5)
nRBC: 0 % (ref 0.0–0.2)

## 2020-02-12 LAB — TSH: TSH: 2.322 u[IU]/mL (ref 0.320–4.118)

## 2020-02-12 MED ORDER — SODIUM CHLORIDE 0.9 % IV SOLN
200.0000 mg | Freq: Once | INTRAVENOUS | Status: AC
  Administered 2020-02-12: 200 mg via INTRAVENOUS
  Filled 2020-02-12: qty 8

## 2020-02-12 MED ORDER — SODIUM CHLORIDE 0.9 % IV SOLN
Freq: Once | INTRAVENOUS | Status: AC
  Filled 2020-02-12: qty 250

## 2020-02-12 NOTE — Progress Notes (Signed)
Ok to treat w/ Creatinine 1.51 per V.O.  Dr. Bebe Liter

## 2020-02-12 NOTE — Patient Instructions (Signed)
[facility] Discharge Instructions for Patients Receiving Chemotherapy  Today you received the following chemotherapy agents:  Keytruda.  To help prevent nausea and vomiting after your treatment, we encourage you to take your nausea medication as directed.   If you develop nausea and vomiting that is not controlled by your nausea medication, call the clinic.   BELOW ARE SYMPTOMS THAT SHOULD BE REPORTED IMMEDIATELY:  *FEVER GREATER THAN 100.5 F  *CHILLS WITH OR WITHOUT FEVER  NAUSEA AND VOMITING THAT IS NOT CONTROLLED WITH YOUR NAUSEA MEDICATION  *UNUSUAL SHORTNESS OF BREATH  *UNUSUAL BRUISING OR BLEEDING  TENDERNESS IN MOUTH AND THROAT WITH OR WITHOUT PRESENCE OF ULCERS  *URINARY PROBLEMS  *BOWEL PROBLEMS  UNUSUAL RASH Items with * indicate a potential emergency and should be followed up as soon as possible.  Feel free to call the clinic should you have any questions or concerns. The clinic phone number is [phone].  Please show the CHEMO ALERT CARD at check-in to the Emergency Department and triage nurse.    

## 2020-02-12 NOTE — Progress Notes (Signed)
Hematology and Oncology Follow Up Visit  Marcus Callahan [id number] [birth year] 49 y.o. 02/12/2020 12:35 PM [place], [place], [facility], [place]   Principle Diagnosis: 49 year old man with kidney cancer diagnosed in February 2021.  He was found to have stage IV clear-cell histology with liver involvement. Prior Therapy:  He is status post robotic assisted laparoscopic left radical nephrectomy completed on August 26, 2019.  Final pathology indicated T3a tumor with invasion into the perirenal soft tissue, renal pelvis and segmental renal vein and renal sinus.  Current therapy: Axitinib 5 mg twice a day with Pembrolizumab 200 mg every 3 weeks started on September 18, 2019.  He is here for cycle 8 of therapy.  Interim History:  Marcus Callahan is here for a return evaluation.  Since the last visit, he reports no major changes in his health.  He continues to tolerate therapy without any major complications.  He denies any nausea, fatigue or anorexia.  He denies any appetite change.  Denies any worsening diarrhea or respiratory complaints.     Medications: Updated on review. Current Outpatient Medications  Medication Sig Dispense Refill  . amLODipine (NORVASC) 10 MG tablet Take 1 tablet (10 mg total) by mouth daily. 90 tablet 2  . INLYTA 5 MG tablet Take 1 tablet (5 mg total) by mouth 2 (two) times daily. 60 tablet 1  . lisinopril-hydrochlorothiazide (ZESTORETIC) 20-25 MG tablet TAKE 1 TABLET BY MOUTH DAILY 90 tablet 1  . metoprolol succinate (TOPROL-XL) 100 MG 24 hr tablet Take 1 tablet (100 mg total) by mouth daily. Take with or immediately following a meal. 90 tablet 2   No current facility-administered medications for this visit.     Allergies:  Allergies  Allergen Reactions  . Codeine Other (See Comments)    Nose bleeds      Physical Exam:   Blood pressure (!) 152/97, pulse 79, temperature 97.8 F (36.6 C), temperature source Temporal, resp. rate 18, height 5'  10" (1.778 m), weight 239 lb 8 oz (108.6 kg), SpO2 100 %.     ECOG: 0      General appearance: Alert, awake without any distress. Head: Atraumatic without abnormalities Oropharynx: Without any thrush or ulcers. Eyes: No scleral icterus. Lymph nodes: No lymphadenopathy noted in the cervical, supraclavicular, or axillary nodes Heart:regular rate and rhythm, without any murmurs or gallops.   Lung: Clear to auscultation without any rhonchi, wheezes or dullness to percussion. Abdomin: Soft, nontender without any shifting dullness or ascites. Musculoskeletal: No clubbing or cyanosis. Neurological: No motor or sensory deficits. Skin: No rashes or lesions.             Lab Results: Lab Results  Component Value Date   WBC 8.4 02/12/2020   HGB 11.3 (L) 02/12/2020   HCT 35.4 (L) 02/12/2020   MCV 79.6 (L) 02/12/2020   PLT 377 02/12/2020     Chemistry      Component Value Date/Time   NA 137 01/21/2020 1131   NA 141 09/28/2019 1517   K 3.7 01/21/2020 1131   CL 105 01/21/2020 1131   CO2 20 (L) 01/21/2020 1131   BUN 18 01/21/2020 1131   BUN 22 09/28/2019 1517   CREATININE 1.44 (H) 01/21/2020 1131      Component Value Date/Time   CALCIUM 9.8 01/21/2020 1131   ALKPHOS 77 01/21/2020 1131   AST 27 01/21/2020 1131   ALT 25 01/21/2020 1131   BILITOT 0.5 01/21/2020 1131  Impression and Plan:  49 year old man with:  1.  Kidney cancer diagnosed in February 2021.  He was found to have stage IV clear-cell with liver involvement.     He had continues to tolerate Pembrolizumab and axitinib without any major complications.  Risks and benefits of continuing this treatment were discussed today.  Potential complications include nausea, fatigue, anorexia, hand-foot syndrome as well as immune mediated complications.  The plan is to repeat imaging studies before the next cycle of therapy.  He is agreeable to proceed with this plan.   2.  Hypertension: His blood  pressure is within normal range between visits although mildly elevated today.  We will continue to monitor.  3.  Immune mediated complications.  I continue to educate him and reiterate he is complications including pneumonitis, colitis and thyroid disease.  4.  Nausea prophylaxis: No nausea or vomiting reported.  Antiemetics are available to him.  5.  IV access: No Port-A-Cath with peripheral veins are currently in use.  6.  Follow-up: In 3 weeks for repeat evaluation after repeat imaging studies.   30 minutes were dedicated to this encounter.  The time was spent on outlining his future plan of care, addressing complication related to therapy and alternative treatment for his cancer.    Zola Button, MD 8/13/202112:35 PM

## 2020-02-16 ENCOUNTER — Other Ambulatory Visit: Payer: Self-pay | Admitting: Oncology

## 2020-02-16 DIAGNOSIS — C642 Malignant neoplasm of left kidney, except renal pelvis: Secondary | ICD-10-CM

## 2020-03-01 ENCOUNTER — Encounter (HOSPITAL_COMMUNITY): Payer: Self-pay

## 2020-03-01 ENCOUNTER — Ambulatory Visit (HOSPITAL_COMMUNITY)
Admission: RE | Admit: 2020-03-01 | Discharge: 2020-03-01 | Disposition: A | Discharge status: Home / Self Care | Payer: BC Managed Care – PPO | Source: Ambulatory Visit | Attending: Oncology | Admitting: Oncology

## 2020-03-01 ENCOUNTER — Other Ambulatory Visit: Payer: Self-pay

## 2020-03-01 DIAGNOSIS — C642 Malignant neoplasm of left kidney, except renal pelvis: Secondary | ICD-10-CM | POA: Diagnosis not present

## 2020-03-01 DIAGNOSIS — K4090 Unilateral inguinal hernia, without obstruction or gangrene, not specified as recurrent: Secondary | ICD-10-CM | POA: Diagnosis not present

## 2020-03-01 DIAGNOSIS — K7689 Other specified diseases of liver: Secondary | ICD-10-CM | POA: Diagnosis not present

## 2020-03-01 DIAGNOSIS — Z905 Acquired absence of kidney: Secondary | ICD-10-CM | POA: Diagnosis not present

## 2020-03-01 DIAGNOSIS — R911 Solitary pulmonary nodule: Secondary | ICD-10-CM | POA: Diagnosis not present

## 2020-03-01 DIAGNOSIS — R918 Other nonspecific abnormal finding of lung field: Secondary | ICD-10-CM | POA: Diagnosis not present

## 2020-03-01 IMAGING — CT CT CHEST W/ CM
2 of 13 series · 10 of 46 positions shown, 16 images · IV contrast (omnipaque)
Comparison: [DATE]

CLINICAL DATA: Metastatic left renal cell carcinoma, status post
nephrectomy, ongoing immunotherapy, assess treatment response

EXAM:
CT CHEST WITH CONTRAST
CT ABDOMEN AND PELVIS WITH AND WITHOUT CONTRAST
TECHNIQUE: Multidetector CT imaging of the chest was performed during
intravenous contrast administration. Multidetector CT imaging of the
abdomen and pelvis was performed following the standard protocol
before and during bolus administration of intravenous contrast.
CONTRAST:  100mL OMNIPAQUE IOHEXOL 300 MG/ML SOLN, additional oral
enteric contrast

[Series 3: coronal pre · coronal · non-contrast · 0.52mm/px · 2 of 107 slices shown, 3 images]
[im 36/107  soft-tissue]
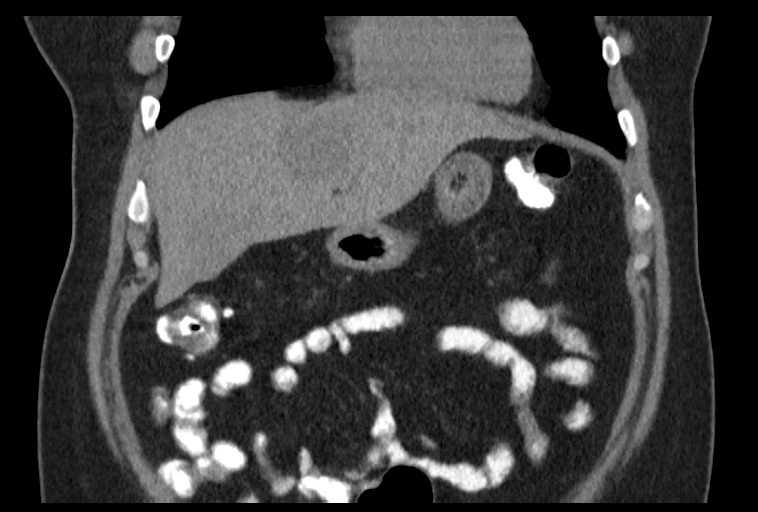
[im 36/107  bone]
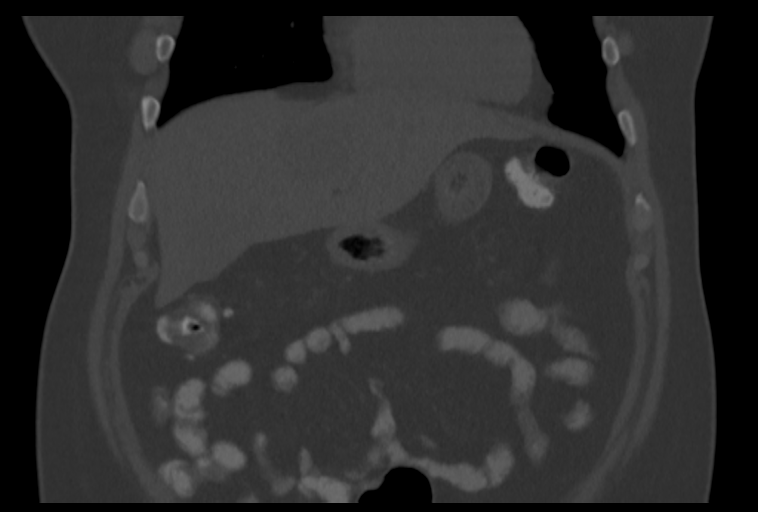
[im 71/107  soft-tissue]
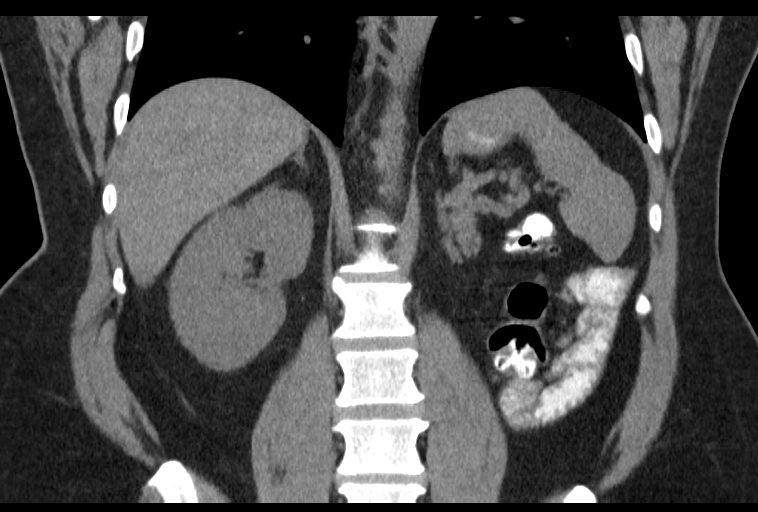

[Series 11: axial nephro · axial · 0.73mm/px · z∈[-590,-86]mm · 8 of 216 slices shown, 13 images]
[im 24/216  soft-tissue]
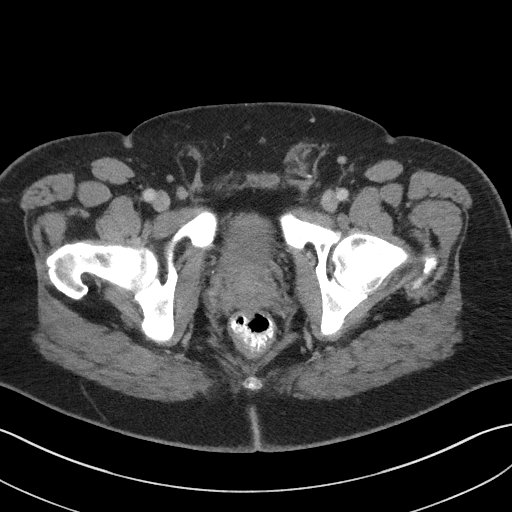
[im 24/216  bone]
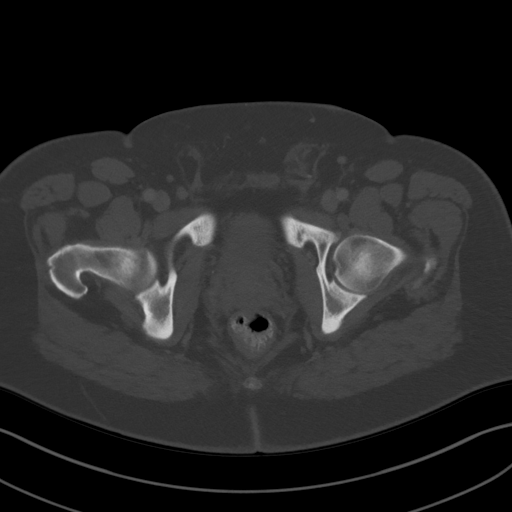
[im 48/216  soft-tissue]
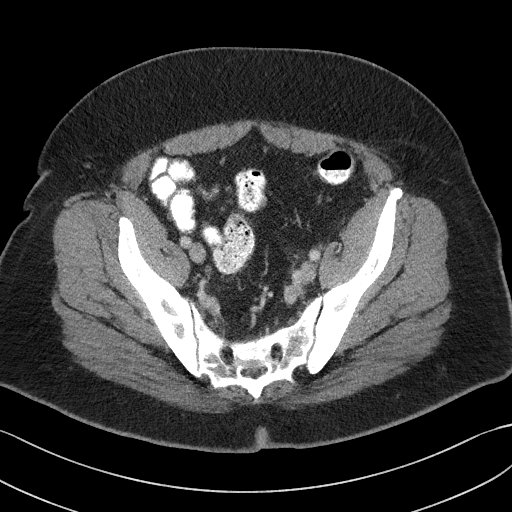
[im 72/216  soft-tissue]
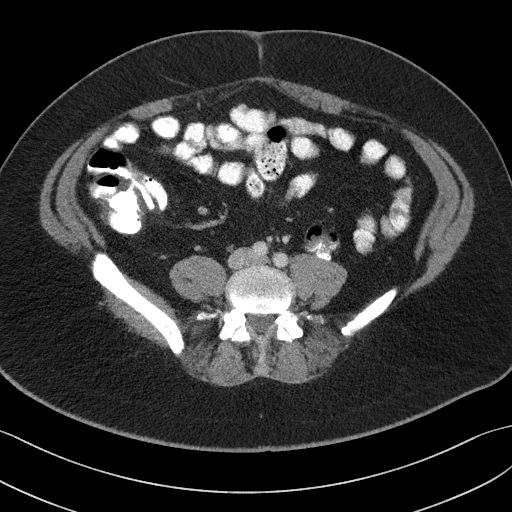
[im 96/216  soft-tissue]
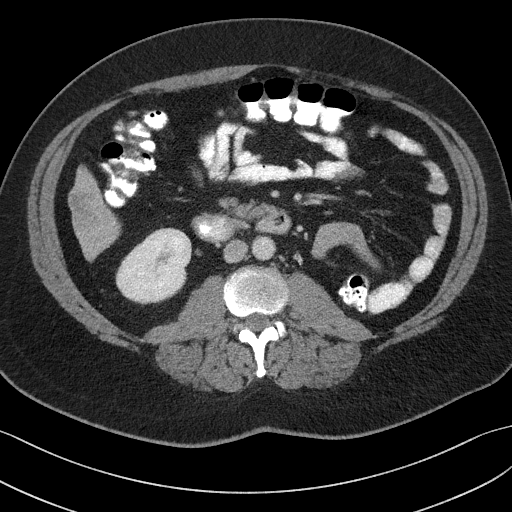
[im 120/216  soft-tissue]
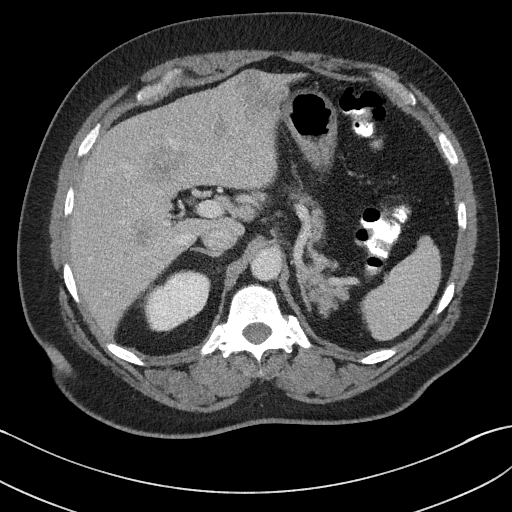
[im 120/216  lung]
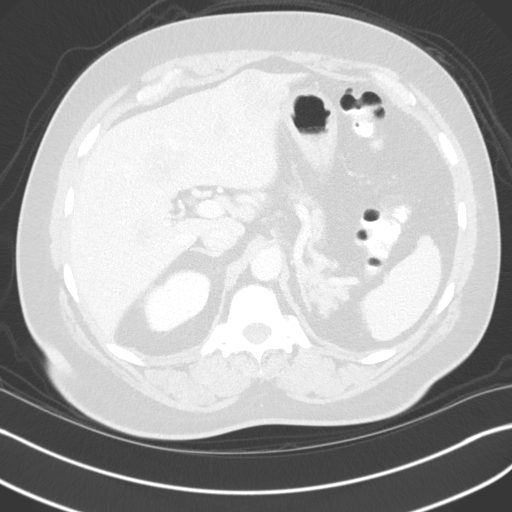
[im 144/216  soft-tissue]
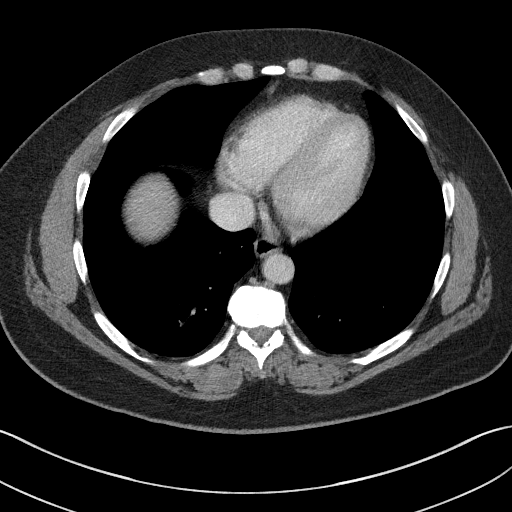
[im 144/216  lung]
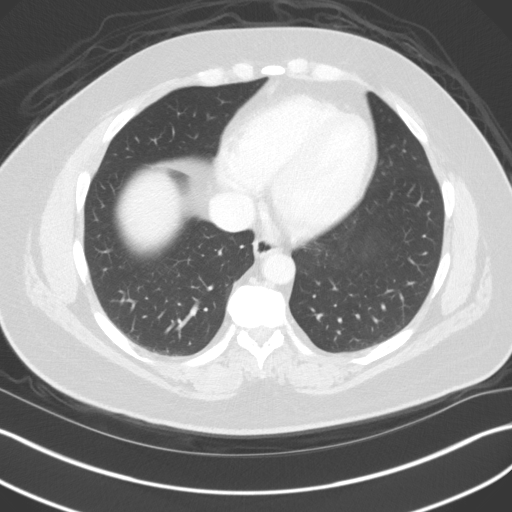
[im 168/216  soft-tissue]
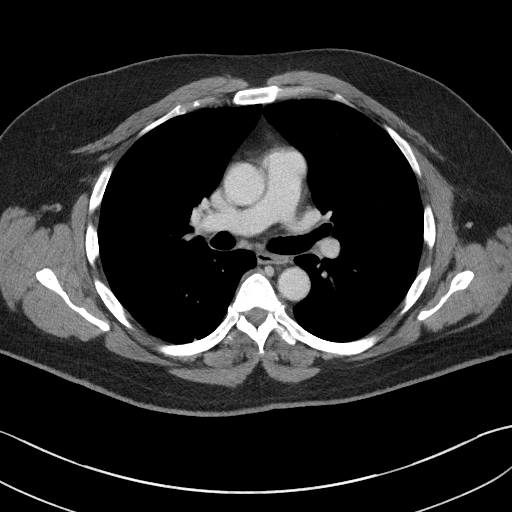
[im 168/216  lung]
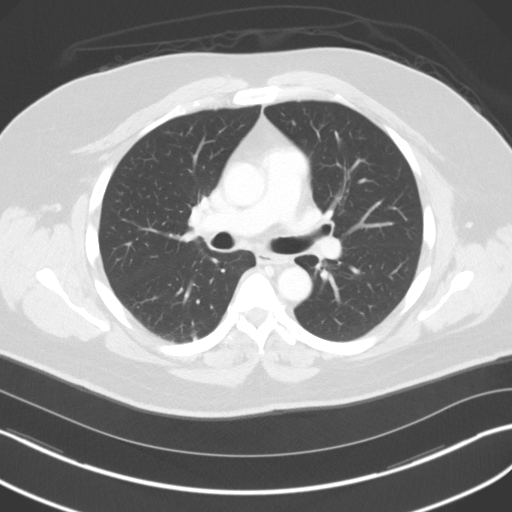
[im 192/216  soft-tissue]
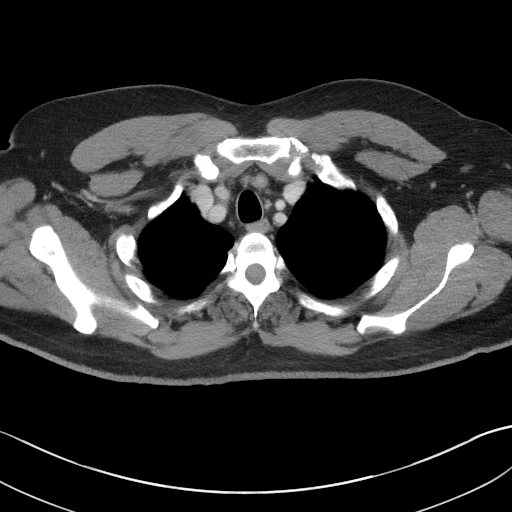
[im 192/216  lung]
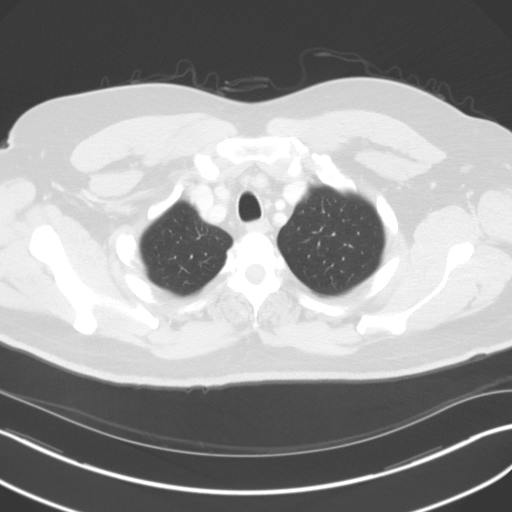

[10 of 46 positions shown; findings below may reference images not displayed]

FINDINGS: CT CHEST FINDINGS

Cardiovascular: No significant vascular findings. Normal heart size.
No pericardial effusion.

Mediastinum/Nodes: No enlarged mediastinal, hilar, or axillary lymph
nodes. Thyroid gland, trachea, and esophagus demonstrate no
significant findings.

Lungs/Pleura: Mild, diffuse bilateral bronchial wall thickening.
Innumerable tiny centrilobular nodules throughout the lungs.
Unchanged discrete 3 mm nodule of the right lower lobe (series 15,
image 100). No pleural effusion or pneumothorax.

Musculoskeletal: No chest wall mass or suspicious bone lesions
identified.

CT ABDOMEN PELVIS FINDINGS

Hepatobiliary: There are numerous hypoenhancing lesions of the liver
parenchyma, which are increased in size compared to prior
examination. An index lesion in the anterior right lobe of the liver
measures 3.5 x 2.8 cm, previously 2.2 x 2.2 cm (series 11, image
86). No gallstones, gallbladder wall thickening, or biliary
dilatation.

Pancreas: Unremarkable. No pancreatic ductal dilatation or
surrounding inflammatory changes.

Spleen: Normal in size without significant abnormality.

Adrenals/Urinary Tract: Adrenal glands are unremarkable. Status post
left nephrectomy. No evidence of local recurrent mass or abnormal
contrast enhancement. The right kidney is normal. No hydronephrosis.
Bladder is unremarkable.

Stomach/Bowel: Stomach is within normal limits. Appendix appears
normal. No evidence of bowel wall thickening, distention, or
inflammatory changes.

Vascular/Lymphatic: No significant vascular findings are present. No
enlarged abdominal or pelvic lymph nodes.

Reproductive: No mass or other abnormality.

Other: Fat containing left inguinal hernia. No abdominopelvic
ascites.

Musculoskeletal: No acute or significant osseous findings.
IMPRESSION: 1. Interval increase in size of multiple hypoenhancing lesions of
the liver. Findings are consistent with worsened hepatic metastatic
disease.
2. No evidence of new metastatic disease in the chest, abdomen, or
pelvis.
3. Unchanged nonspecific 3 mm nodule of the right lower lobe.
Attention on follow-up.

4. Status post left nephrectomy. No evidence of local recurrent mass
or abnormal contrast enhancement.
5. Mild, diffuse bilateral bronchial wall thickening with
innumerable tiny centrilobular nodules throughout the lungs, likely
smoking-related respiratory bronchiolitis.

## 2020-03-01 IMAGING — CT CT ABD-PEL WO/W CM
2 of 13 series · 10 of 46 positions shown, 16 images · IV contrast (omnipaque)
Comparison: [DATE]

CLINICAL DATA: Metastatic left renal cell carcinoma, status post
nephrectomy, ongoing immunotherapy, assess treatment response

EXAM:
CT CHEST WITH CONTRAST
CT ABDOMEN AND PELVIS WITH AND WITHOUT CONTRAST
TECHNIQUE: Multidetector CT imaging of the chest was performed during
intravenous contrast administration. Multidetector CT imaging of the
abdomen and pelvis was performed following the standard protocol
before and during bolus administration of intravenous contrast.
CONTRAST:  100mL OMNIPAQUE IOHEXOL 300 MG/ML SOLN, additional oral
enteric contrast

[Series 3: coronal pre · coronal · non-contrast · 0.52mm/px · 2 of 107 slices shown, 3 images]
[im 36/107  soft-tissue]
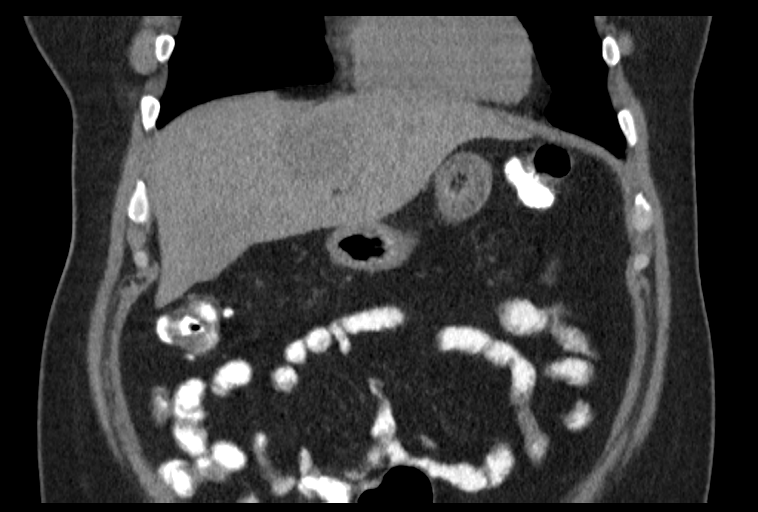
[im 36/107  bone]
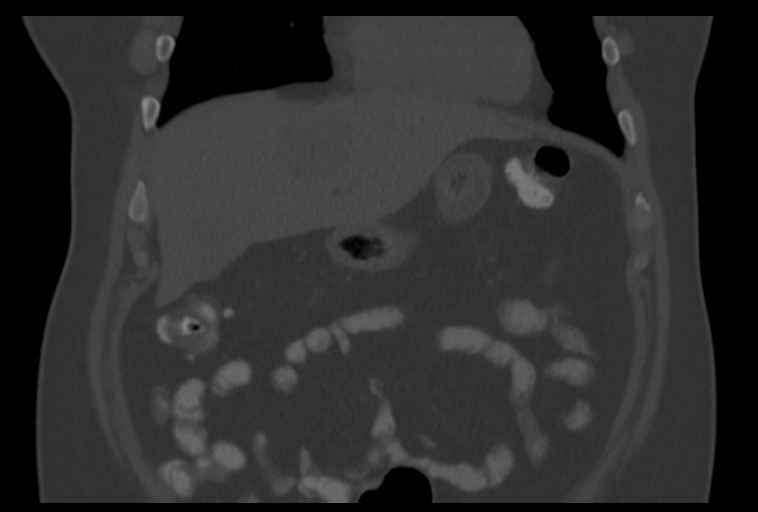
[im 71/107  soft-tissue]
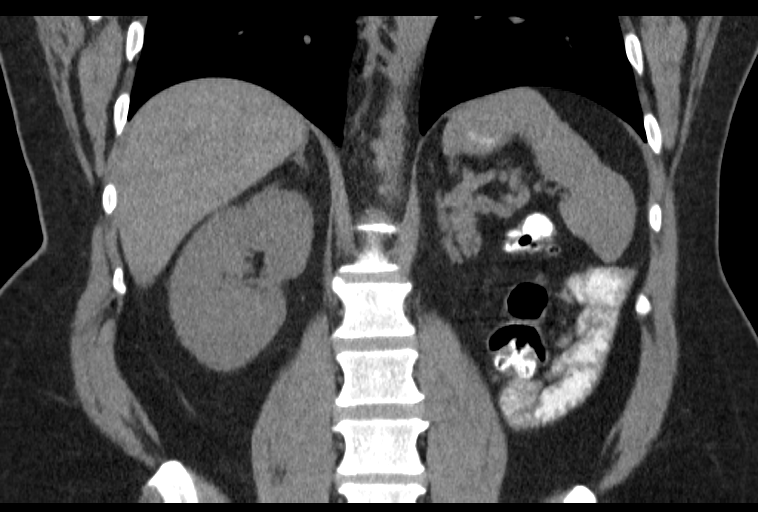

[Series 11: axial nephro · axial · 0.73mm/px · z∈[-590,-86]mm · 8 of 216 slices shown, 13 images]
[im 24/216  soft-tissue]
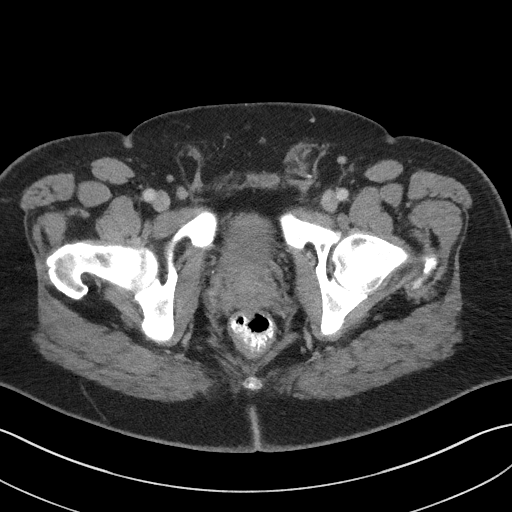
[im 24/216  bone]
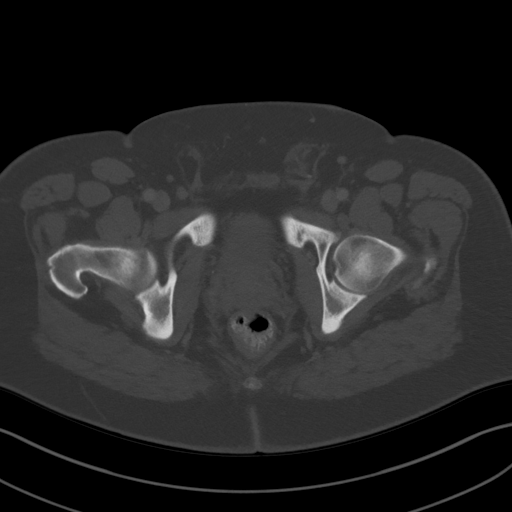
[im 48/216  soft-tissue]
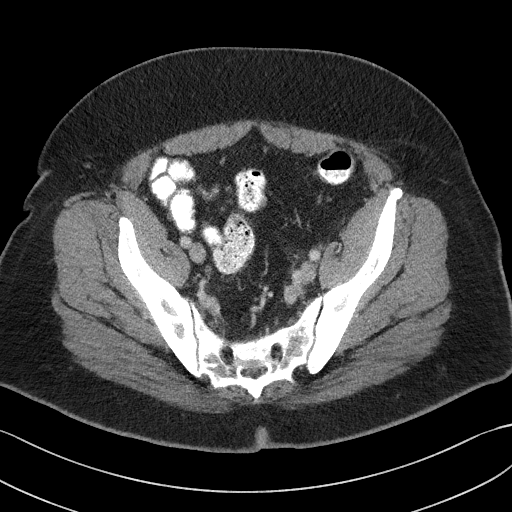
[im 72/216  soft-tissue]
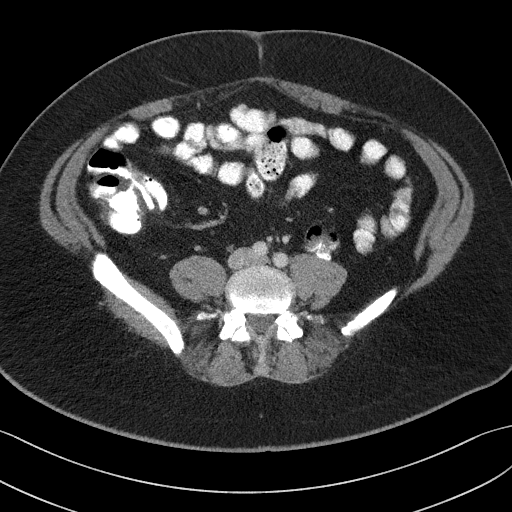
[im 96/216  soft-tissue]
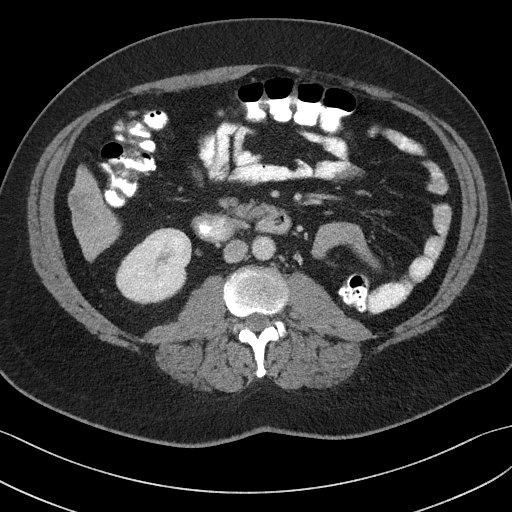
[im 120/216  soft-tissue]
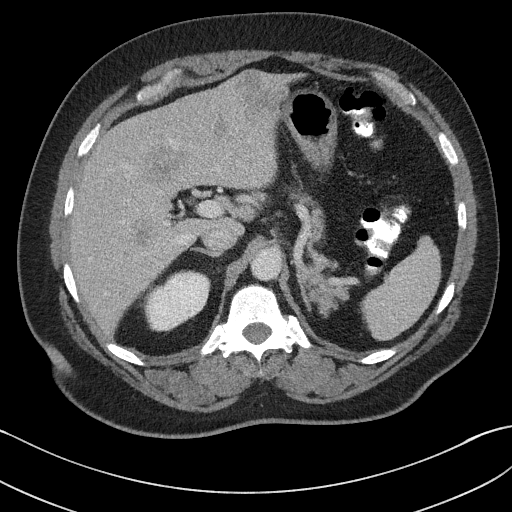
[im 120/216  lung]
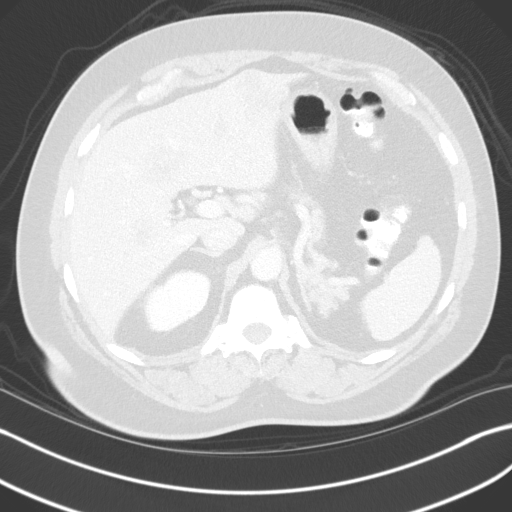
[im 144/216  soft-tissue]
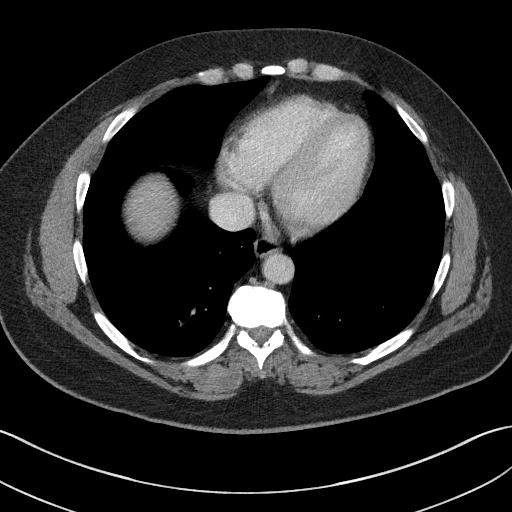
[im 144/216  lung]
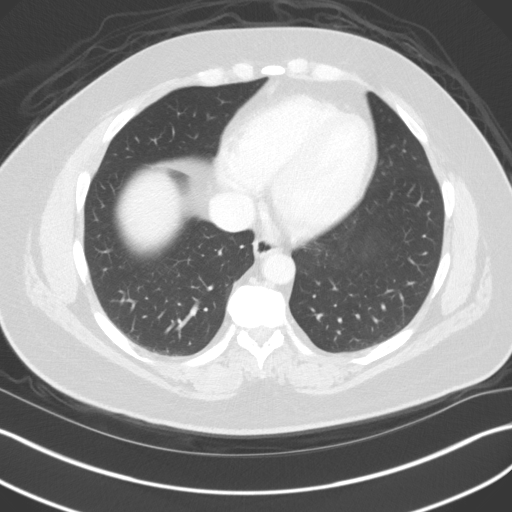
[im 168/216  soft-tissue]
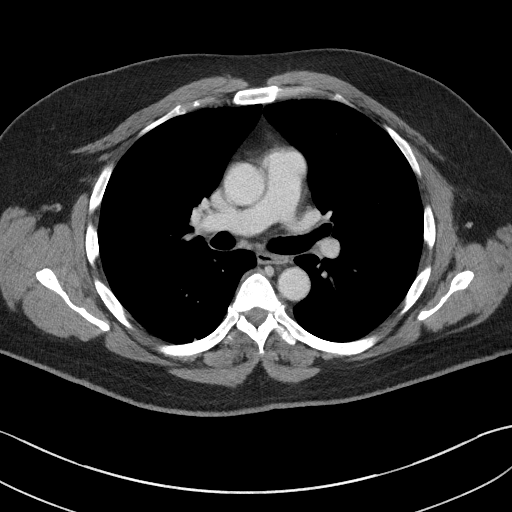
[im 168/216  lung]
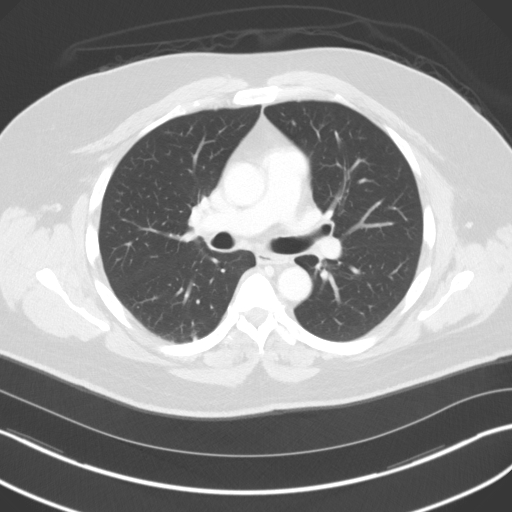
[im 192/216  soft-tissue]
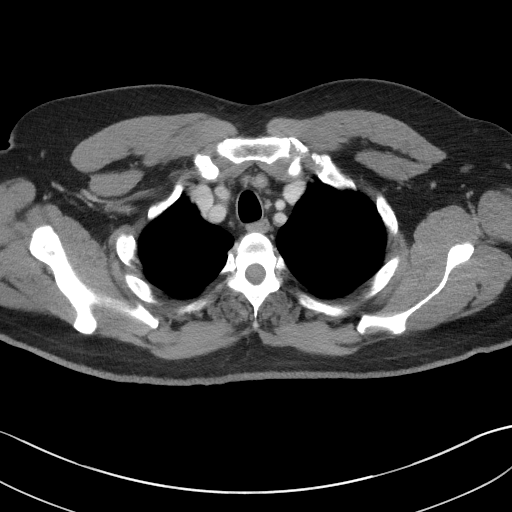
[im 192/216  lung]
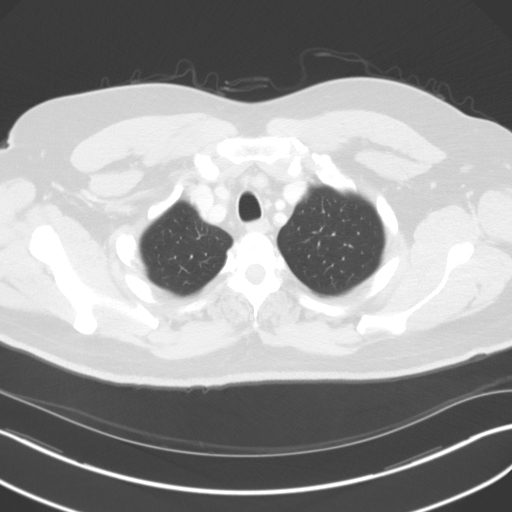

[10 of 46 positions shown; findings below may reference images not displayed]

FINDINGS: CT CHEST FINDINGS

Cardiovascular: No significant vascular findings. Normal heart size.
No pericardial effusion.

Mediastinum/Nodes: No enlarged mediastinal, hilar, or axillary lymph
nodes. Thyroid gland, trachea, and esophagus demonstrate no
significant findings.

Lungs/Pleura: Mild, diffuse bilateral bronchial wall thickening.
Innumerable tiny centrilobular nodules throughout the lungs.
Unchanged discrete 3 mm nodule of the right lower lobe (series 15,
image 100). No pleural effusion or pneumothorax.

Musculoskeletal: No chest wall mass or suspicious bone lesions
identified.

CT ABDOMEN PELVIS FINDINGS

Hepatobiliary: There are numerous hypoenhancing lesions of the liver
parenchyma, which are increased in size compared to prior
examination. An index lesion in the anterior right lobe of the liver
measures 3.5 x 2.8 cm, previously 2.2 x 2.2 cm (series 11, image
86). No gallstones, gallbladder wall thickening, or biliary
dilatation.

Pancreas: Unremarkable. No pancreatic ductal dilatation or
surrounding inflammatory changes.

Spleen: Normal in size without significant abnormality.

Adrenals/Urinary Tract: Adrenal glands are unremarkable. Status post
left nephrectomy. No evidence of local recurrent mass or abnormal
contrast enhancement. The right kidney is normal. No hydronephrosis.
Bladder is unremarkable.

Stomach/Bowel: Stomach is within normal limits. Appendix appears
normal. No evidence of bowel wall thickening, distention, or
inflammatory changes.

Vascular/Lymphatic: No significant vascular findings are present. No
enlarged abdominal or pelvic lymph nodes.

Reproductive: No mass or other abnormality.

Other: Fat containing left inguinal hernia. No abdominopelvic
ascites.

Musculoskeletal: No acute or significant osseous findings.
IMPRESSION: 1. Interval increase in size of multiple hypoenhancing lesions of
the liver. Findings are consistent with worsened hepatic metastatic
disease.
2. No evidence of new metastatic disease in the chest, abdomen, or
pelvis.
3. Unchanged nonspecific 3 mm nodule of the right lower lobe.
Attention on follow-up.

4. Status post left nephrectomy. No evidence of local recurrent mass
or abnormal contrast enhancement.
5. Mild, diffuse bilateral bronchial wall thickening with
innumerable tiny centrilobular nodules throughout the lungs, likely
smoking-related respiratory bronchiolitis.

## 2020-03-01 MED ORDER — SODIUM CHLORIDE (PF) 0.9 % IJ SOLN
INTRAMUSCULAR | Status: AC
  Filled 2020-03-01: qty 50

## 2020-03-01 MED ORDER — IOHEXOL 300 MG/ML  SOLN
100.0000 mL | Freq: Once | INTRAMUSCULAR | Status: AC | PRN
  Administered 2020-03-01: 100 mL via INTRAVENOUS

## 2020-03-03 ENCOUNTER — Encounter (HOSPITAL_COMMUNITY): Payer: Self-pay | Admitting: Radiology

## 2020-03-03 ENCOUNTER — Inpatient Hospital Stay (HOSPITAL_BASED_OUTPATIENT_CLINIC_OR_DEPARTMENT_OTHER): Payer: BC Managed Care – PPO | Admitting: Oncology

## 2020-03-03 ENCOUNTER — Inpatient Hospital Stay: Payer: BC Managed Care – PPO

## 2020-03-03 ENCOUNTER — Inpatient Hospital Stay: Payer: BC Managed Care – PPO | Attending: Oncology

## 2020-03-03 ENCOUNTER — Other Ambulatory Visit: Payer: Self-pay

## 2020-03-03 VITALS — BP 125/90 | HR 80 | Temp 97.7°F | Resp 18 | Ht 70.0 in | Wt 233.0 lb

## 2020-03-03 DIAGNOSIS — I10 Essential (primary) hypertension: Secondary | ICD-10-CM | POA: Diagnosis not present

## 2020-03-03 DIAGNOSIS — R160 Hepatomegaly, not elsewhere classified: Secondary | ICD-10-CM

## 2020-03-03 DIAGNOSIS — C642 Malignant neoplasm of left kidney, except renal pelvis: Secondary | ICD-10-CM

## 2020-03-03 DIAGNOSIS — Z79899 Other long term (current) drug therapy: Secondary | ICD-10-CM | POA: Insufficient documentation

## 2020-03-03 DIAGNOSIS — Z905 Acquired absence of kidney: Secondary | ICD-10-CM | POA: Insufficient documentation

## 2020-03-03 DIAGNOSIS — Z5112 Encounter for antineoplastic immunotherapy: Secondary | ICD-10-CM | POA: Diagnosis not present

## 2020-03-03 DIAGNOSIS — C787 Secondary malignant neoplasm of liver and intrahepatic bile duct: Secondary | ICD-10-CM | POA: Insufficient documentation

## 2020-03-03 LAB — CMP (CANCER CENTER ONLY)
ALT: 23 U/L (ref 0–44)
AST: 30 U/L (ref 15–41)
Albumin: 4.1 g/dL (ref 3.5–5.0)
Alkaline Phosphatase: 89 U/L (ref 38–126)
Anion gap: 15 (ref 5–15)
BUN: 18 mg/dL (ref 6–20)
CO2: 18 mmol/L — ABNORMAL LOW (ref 22–32)
Calcium: 10.4 mg/dL — ABNORMAL HIGH (ref 8.9–10.3)
Chloride: 105 mmol/L (ref 98–111)
Creatinine: 1.45 mg/dL — ABNORMAL HIGH (ref 0.61–1.24)
GFR, Est AFR Am: >60 mL/min (ref >60)
GFR, Estimated: 55 mL/min — ABNORMAL LOW (ref >60)
Glucose, Bld: 106 mg/dL — ABNORMAL HIGH (ref 70–99)
Potassium: 3.6 mmol/L (ref 3.5–5.1)
Sodium: 138 mmol/L (ref 135–145)
Total Bilirubin: 0.5 mg/dL (ref 0.3–1.2)
Total Protein: 8.1 g/dL (ref 6.5–8.1)

## 2020-03-03 LAB — CBC WITH DIFFERENTIAL (CANCER CENTER ONLY)
Abs Immature Granulocytes: 0.02 10*3/uL (ref 0.00–0.07)
Basophils Absolute: 0 10*3/uL (ref 0.0–0.1)
Basophils Relative: 0 %
Eosinophils Absolute: 0.1 10*3/uL (ref 0.0–0.5)
Eosinophils Relative: 1 %
HCT: 35.6 % — ABNORMAL LOW (ref 39.0–52.0)
Hemoglobin: 11.5 g/dL — ABNORMAL LOW (ref 13.0–17.0)
Immature Granulocytes: 0 %
Lymphocytes Relative: 25 %
Lymphs Abs: 1.8 10*3/uL (ref 0.7–4.0)
MCH: 25.3 pg — ABNORMAL LOW (ref 26.0–34.0)
MCHC: 32.3 g/dL (ref 30.0–36.0)
MCV: 78.2 fL — ABNORMAL LOW (ref 80.0–100.0)
Monocytes Absolute: 0.6 10*3/uL (ref 0.1–1.0)
Monocytes Relative: 8 %
Neutro Abs: 4.7 10*3/uL (ref 1.7–7.7)
Neutrophils Relative %: 66 %
Platelet Count: 427 10*3/uL — ABNORMAL HIGH (ref 150–400)
RBC: 4.55 MIL/uL (ref 4.22–5.81)
RDW: 14.5 % (ref 11.5–15.5)
WBC Count: 7.2 10*3/uL (ref 4.0–10.5)
nRBC: 0 % (ref 0.0–0.2)

## 2020-03-03 LAB — TSH: TSH: 5.272 u[IU]/mL — ABNORMAL HIGH (ref 0.320–4.118)

## 2020-03-03 NOTE — Progress Notes (Signed)
DISCONTINUE ON PATHWAY REGIMEN - Renal Cell     A cycle is every 21 days:     Axitinib      Pembrolizumab   **Always confirm dose/schedule in your pharmacy ordering system**  REASON: Disease Progression PRIOR TREATMENT: [id number]: Pembrolizumab 200 mg D1 + Axitinib 5 mg BID D1-21 q21 Days TREATMENT RESPONSE: Stable Disease (SD)  START ON PATHWAY REGIMEN - Renal Cell     A cycle is every 21 days:     Nivolumab      Ipilimumab    A cycle is every 28 days:     Nivolumab   **Always confirm dose/schedule in your pharmacy ordering system**  Patient Characteristics: Stage IV/Metastatic Disease, Clear Cell, First Line, Intermediate or Poor Risk Therapeutic Status: Stage IV/Metastatic Disease Histology: Clear Cell Line of Therapy: First Line Risk Status: Intermediate Risk Intent of Therapy: Non-Curative / Palliative Intent, Discussed with Patient

## 2020-03-03 NOTE — Progress Notes (Signed)
[name] Male, 49 y.o., 29-Apr-1971 MRN:  [id number] Phone:  [phone] Jerilynn Mages) PCP:  [name], MD Coverage:  Sherre Poot [employer] Ppo Next Appt With Radiology ([facility]-US 2) 03/15/2020 at 1:00 PM  RE: US Liver Biopsy Received: Today Sandi Mariscal, MD  Garth Bigness D US guided liver lesion Bx.   CT CAP - right lobe - image 86, series 11   Thx,  Jay       Previous Messages   ----- Message -----  From: Garth Bigness D  Sent: 03/03/2020  9:26 AM EDT  To: [facility] Procedure Requests  Subject: US Liver Biopsy                  Procedure:  US Liver Biopsy   Reason: Primary malignant neoplasm of left kidney with metastasis from kidney to other site, liver masses   History: CT in computer   Provider: Wyatt Portela   Provider Contact: [phone]

## 2020-03-03 NOTE — Progress Notes (Signed)
Hematology and Oncology Follow Up Visit  Marcus Callahan [id number] 1970-07-20 49 y.o. 03/03/2020 8:07 AM Marcus Callahan, MDSagardia, Marcus Callahan, [place]   Principle Diagnosis: 49 year old man with stage IV renal cell carcinoma with hepatic involvement diagnosed in February 2021. The pathology of his renal malignancy is clear-cell.   Prior Therapy:  He is status post robotic assisted laparoscopic left radical nephrectomy completed on August 26, 2019.  Final pathology indicated T3a tumor with invasion into the perirenal soft tissue, renal pelvis and segmental renal vein and renal sinus.  Current therapy: Axitinib 5 mg twice a day with Pembrolizumab 200 mg every 3 weeks started on September 18, 2019.  He is status post 8 cycles of therapy.  Interim History:  Marcus Callahan returns today for a follow-up visit. Since the last visit, he reports no major changes in his health.  He continues to tolerate therapy without any complaints.  Denies any nausea vomiting or mouth pain.  He did report some minor weight loss some of it is intentional.  Denies any diarrhea or excessive fatigue.  His performance status quality of life remain excellent.     Medications: Reviewed without changes. Current Outpatient Medications  Medication Sig Dispense Refill  . amLODipine (NORVASC) 10 MG tablet Take 1 tablet (10 mg total) by mouth daily. 90 tablet 2  . INLYTA 5 MG tablet TAKE 1 TABLET TWICE A DAY 60 tablet 1  . lisinopril-hydrochlorothiazide (ZESTORETIC) 20-25 MG tablet TAKE 1 TABLET BY MOUTH DAILY 90 tablet 1  . metoprolol succinate (TOPROL-XL) 100 MG 24 hr tablet Take 1 tablet (100 mg total) by mouth daily. Take with or immediately following a meal. 90 tablet 2   No current facility-administered medications for this visit.     Allergies:  Allergies  Allergen Reactions  . Codeine Other (See Comments)    Nose bleeds      Physical Exam:   Blood pressure 125/90, pulse 80, temperature 97.7 F  (36.5 C), temperature source Tympanic, resp. rate 18, height 5\' 10"  (1.778 m), weight 233 lb (105.7 kg), SpO2 100 %.      ECOG: 0   General appearance: Comfortable appearing without any discomfort Head: Normocephalic without any trauma Oropharynx: Mucous membranes are moist and pink without any thrush or ulcers. Eyes: Pupils are equal and round reactive to light. Lymph nodes: No cervical, supraclavicular, inguinal or axillary lymphadenopathy.   Heart:regular rate and rhythm.  S1 and S2 without leg edema. Lung: Clear without any rhonchi or wheezes.  No dullness to percussion. Abdomin: Soft, nontender, nondistended with good bowel sounds.  No hepatosplenomegaly. Musculoskeletal: No joint deformity or effusion.  Full range of motion noted. Neurological: No deficits noted on motor, sensory and deep tendon reflex exam. Skin: No petechial rash or dryness.  Appeared moist.             Lab Results: Lab Results  Component Value Date   WBC 8.4 02/12/2020   HGB 11.3 (L) 02/12/2020   HCT 35.4 (L) 02/12/2020   MCV 79.6 (L) 02/12/2020   PLT 377 02/12/2020     Chemistry      Component Value Date/Time   NA 138 02/12/2020 1225   NA 141 09/28/2019 1517   K 3.7 02/12/2020 1225   CL 106 02/12/2020 1225   CO2 18 (L) 02/12/2020 1225   BUN 21 (H) 02/12/2020 1225   BUN 22 09/28/2019 1517   CREATININE 1.51 (H) 02/12/2020 1225      Component Value Date/Time   CALCIUM  10.3 02/12/2020 1225   ALKPHOS 79 02/12/2020 1225   AST 36 02/12/2020 1225   ALT 31 02/12/2020 1225   BILITOT 0.3 02/12/2020 1225      IMPRESSION: 1. Interval increase in size of multiple hypoenhancing lesions of the liver. Findings are consistent with worsened hepatic metastatic disease. 2. No evidence of new metastatic disease in the chest, abdomen, or pelvis. 3. Unchanged nonspecific 3 mm nodule of the right lower lobe. Attention on follow-up.  4. Status post left nephrectomy. No evidence of local  recurrent mass or abnormal contrast enhancement. 5. Mild, diffuse bilateral bronchial wall thickening with innumerable tiny centrilobular nodules throughout the lungs, likely smoking-related respiratory bronchiolitis.    Impression and Plan:  49 year old man with:  1. Stage IV clear-cell renal cell carcinoma diagnosed in February 2021. He was found to have hepatic lesion in addition to large kidney tumor.  He has completed 8 cycles of axitinib and Pembrolizumab and imaging studies completed on March 01, 2020 were reviewed today.  He has no evidence of disease outside of his hepatic metastasis which have increased since the last evaluation.  Differential diagnosis of these findings were reviewed today which include a renal cell carcinoma that is refractory to the current treatment versus a different pathology.  Before proceeding with different salvage approach at this time I would like to repeat the tissue biopsy on his hepatic metastasis and will determine best of course of action accordingly.  If his tumor is indeed renal cell carcinoma then we will switch to different salvage therapy utilizing ipilimumab and nivolumab.  If this is a different histology we will address accordingly.  I recommended continuing axitinib for the time being.  He is agreeable with this plan.  2.  Hypertension: No issues reported with his blood pressure since the last visit.  3.  Immune mediated complications.  No autoimmune complications noted at this time.  I continue to educate him of potential complications associated with combined immunotherapy.  He is complications including pneumonitis, colitis, hepatitis and hypophysitis.  4.  Nausea prophylaxis: Compazine is available to him without any nausea vomiting.  5.  IV access: Peripheral veins are currently in use.  No need for Port-A-Cath at this time.  6.  Follow-up: We will be in the next few weeks to follow his progress and initiate salvage therapy  accordingly.   30 minutes were spent on this visit.  The time was dedicated to reviewing imaging studies, discussing differential diagnosis and addressing future plan of care.    Zola Button, MD 9/2/20218:07 AM

## 2020-03-11 ENCOUNTER — Telehealth: Payer: Self-pay | Admitting: Oncology

## 2020-03-11 NOTE — Telephone Encounter (Signed)
Called patient to confirm appointments. Left a message for patient with upcoming appointments details.

## 2020-03-13 ENCOUNTER — Other Ambulatory Visit: Payer: Self-pay | Admitting: Radiology

## 2020-03-14 ENCOUNTER — Ambulatory Visit: Payer: BC Managed Care – PPO | Admitting: Emergency Medicine

## 2020-03-14 NOTE — Progress Notes (Signed)
Pharmacist Chemotherapy Monitoring - Initial Assessment    Anticipated start date: 03/18/2020   Regimen:  . Are orders appropriate based on the patient's diagnosis, regimen, and cycle? Yes . Does the plan date match the patient's scheduled date? Yes . Is the sequencing of drugs appropriate? Yes . Are the premedications appropriate for the patient's regimen? Yes . Prior Authorization for treatment is: Approved o If applicable, is the correct biosimilar selected based on the patient's insurance? not applicable  Organ Function and Labs: Marland Kitchen Are dose adjustments needed based on the patient's renal function, hepatic function, or hematologic function? Yes . Are appropriate labs ordered prior to the start of patient's treatment? Yes . Other organ system assessment, if indicated: N/A . The following baseline labs, if indicated, have been ordered: ipilimumab: baseline TSH +/- T4 and nivolumab: baseline TSH +/- T4  Dose Assessment: . Are the drug doses appropriate? Yes . Are the following correct: o Drug concentrations Yes o IV fluid compatible with drug Yes o Administration routes Yes o Timing of therapy Yes . If applicable, does the patient have documented access for treatment and/or plans for port-a-cath placement? no . If applicable, have lifetime cumulative doses been properly documented and assessed? no Lifetime Dose Tracking  No doses have been documented on this patient for the following tracked chemicals: Doxorubicin, Epirubicin, Idarubicin, Daunorubicin, Mitoxantrone, Bleomycin, Oxaliplatin, Carboplatin, Liposomal Doxorubicin  o   Toxicity Monitoring/Prevention: . The patient has the following take home antiemetics prescribed: N/A . The patient has the following take home medications prescribed: N/A . Medication allergies and previous infusion related reactions, if applicable, have been reviewed and addressed. No . The patient's current medication list has been assessed for drug-drug  interactions with their chemotherapy regimen. no significant drug-drug interactions were identified on review.  Order Review: . Are the treatment plan orders signed? Yes . Is the patient scheduled to see a provider prior to their treatment? No  I verify that I have reviewed each item in the above checklist and answered each question accordingly.  Dequavius Kuhner D 03/14/2020 4:05 PM

## 2020-03-15 ENCOUNTER — Other Ambulatory Visit: Payer: Self-pay

## 2020-03-15 ENCOUNTER — Ambulatory Visit (HOSPITAL_COMMUNITY)
Admission: RE | Admit: 2020-03-15 | Discharge: 2020-03-15 | Disposition: A | Discharge status: Home / Self Care | Payer: BC Managed Care – PPO | Source: Ambulatory Visit | Attending: Emergency Medicine | Admitting: Emergency Medicine

## 2020-03-15 ENCOUNTER — Encounter (HOSPITAL_COMMUNITY): Payer: Self-pay

## 2020-03-15 ENCOUNTER — Ambulatory Visit (HOSPITAL_COMMUNITY)
Admission: RE | Admit: 2020-03-15 | Discharge: 2020-03-15 | Disposition: A | Discharge status: Home / Self Care | Payer: BC Managed Care – PPO | Source: Ambulatory Visit | Attending: Oncology | Admitting: Oncology

## 2020-03-15 DIAGNOSIS — I10 Essential (primary) hypertension: Secondary | ICD-10-CM | POA: Insufficient documentation

## 2020-03-15 DIAGNOSIS — Z905 Acquired absence of kidney: Secondary | ICD-10-CM | POA: Insufficient documentation

## 2020-03-15 DIAGNOSIS — R160 Hepatomegaly, not elsewhere classified: Secondary | ICD-10-CM | POA: Insufficient documentation

## 2020-03-15 DIAGNOSIS — C642 Malignant neoplasm of left kidney, except renal pelvis: Secondary | ICD-10-CM | POA: Diagnosis not present

## 2020-03-15 DIAGNOSIS — C787 Secondary malignant neoplasm of liver and intrahepatic bile duct: Secondary | ICD-10-CM | POA: Diagnosis not present

## 2020-03-15 DIAGNOSIS — Z79899 Other long term (current) drug therapy: Secondary | ICD-10-CM | POA: Diagnosis not present

## 2020-03-15 DIAGNOSIS — K7689 Other specified diseases of liver: Secondary | ICD-10-CM | POA: Diagnosis not present

## 2020-03-15 DIAGNOSIS — C229 Malignant neoplasm of liver, not specified as primary or secondary: Secondary | ICD-10-CM | POA: Diagnosis not present

## 2020-03-15 DIAGNOSIS — Z885 Allergy status to narcotic agent status: Secondary | ICD-10-CM | POA: Diagnosis not present

## 2020-03-15 DIAGNOSIS — C649 Malignant neoplasm of unspecified kidney, except renal pelvis: Secondary | ICD-10-CM | POA: Diagnosis not present

## 2020-03-15 LAB — COMPREHENSIVE METABOLIC PANEL
ALT: 27 U/L (ref 0–44)
AST: 35 U/L (ref 15–41)
Albumin: 4.5 g/dL (ref 3.5–5.0)
Alkaline Phosphatase: 64 U/L (ref 38–126)
Anion gap: 15 (ref 5–15)
BUN: 24 mg/dL — ABNORMAL HIGH (ref 6–20)
CO2: 20 mmol/L — ABNORMAL LOW (ref 22–32)
Calcium: 9.7 mg/dL (ref 8.9–10.3)
Chloride: 102 mmol/L (ref 98–111)
Creatinine, Ser: 1.41 mg/dL — ABNORMAL HIGH (ref 0.61–1.24)
GFR calc Af Amer: >60 mL/min (ref >60)
GFR calc non Af Amer: 57 mL/min — ABNORMAL LOW (ref >60)
Glucose, Bld: 103 mg/dL — ABNORMAL HIGH (ref 70–99)
Potassium: 3.6 mmol/L (ref 3.5–5.1)
Sodium: 137 mmol/L (ref 135–145)
Total Bilirubin: 0.6 mg/dL (ref 0.3–1.2)
Total Protein: 8 g/dL (ref 6.5–8.1)

## 2020-03-15 LAB — CBC WITH DIFFERENTIAL/PLATELET
Abs Immature Granulocytes: 0.02 10*3/uL (ref 0.00–0.07)
Basophils Absolute: 0 10*3/uL (ref 0.0–0.1)
Basophils Relative: 0 %
Eosinophils Absolute: 0.1 10*3/uL (ref 0.0–0.5)
Eosinophils Relative: 1 %
HCT: 35.9 % — ABNORMAL LOW (ref 39.0–52.0)
Hemoglobin: 11.4 g/dL — ABNORMAL LOW (ref 13.0–17.0)
Immature Granulocytes: 0 %
Lymphocytes Relative: 25 %
Lymphs Abs: 2.1 10*3/uL (ref 0.7–4.0)
MCH: 25.8 pg — ABNORMAL LOW (ref 26.0–34.0)
MCHC: 31.8 g/dL (ref 30.0–36.0)
MCV: 81.2 fL (ref 80.0–100.0)
Monocytes Absolute: 0.5 10*3/uL (ref 0.1–1.0)
Monocytes Relative: 6 %
Neutro Abs: 5.7 10*3/uL (ref 1.7–7.7)
Neutrophils Relative %: 68 %
Platelets: 375 10*3/uL (ref 150–400)
RBC: 4.42 MIL/uL (ref 4.22–5.81)
RDW: 14.7 % (ref 11.5–15.5)
WBC: 8.4 10*3/uL (ref 4.0–10.5)
nRBC: 0 % (ref 0.0–0.2)

## 2020-03-15 LAB — PROTIME-INR
INR: 0.9 (ref 0.8–1.2)
Prothrombin Time: 11.7 seconds (ref 11.4–15.2)

## 2020-03-15 IMAGING — US US BIOPSY CORE LIVER
1 series · 13 of 25 positions shown · non-contrast
Comparison: none

INDICATION: 49-year-old male with a diagnosis of stage IV renal cell carcinoma.
He presents for ultrasound-guided biopsy of progressive hepatic
metastases.

[Series 1: us biopsy core liver · 13 of 30 slices shown]
[im 1/30]
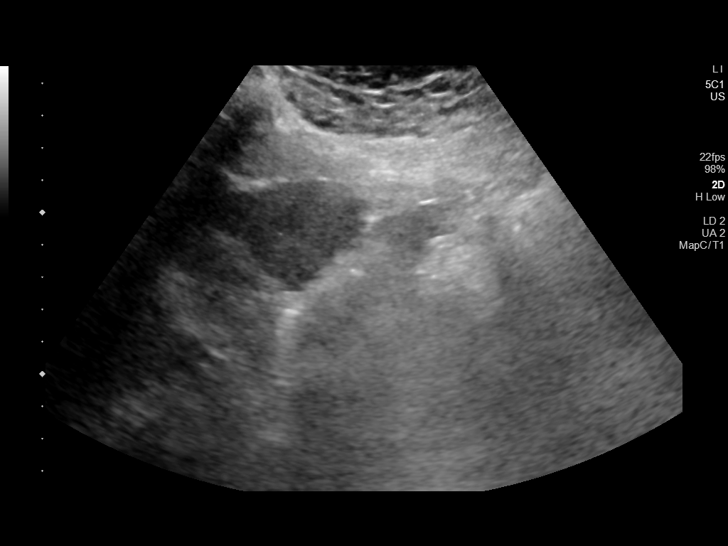
[im 3/30]
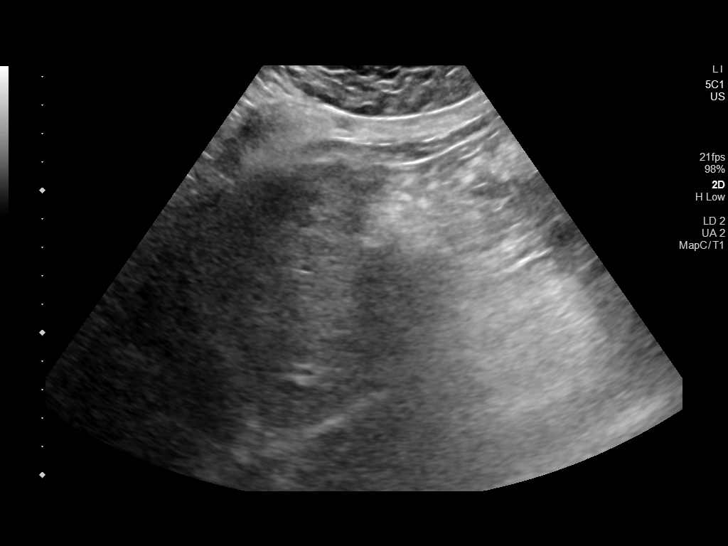
[im 5/30]
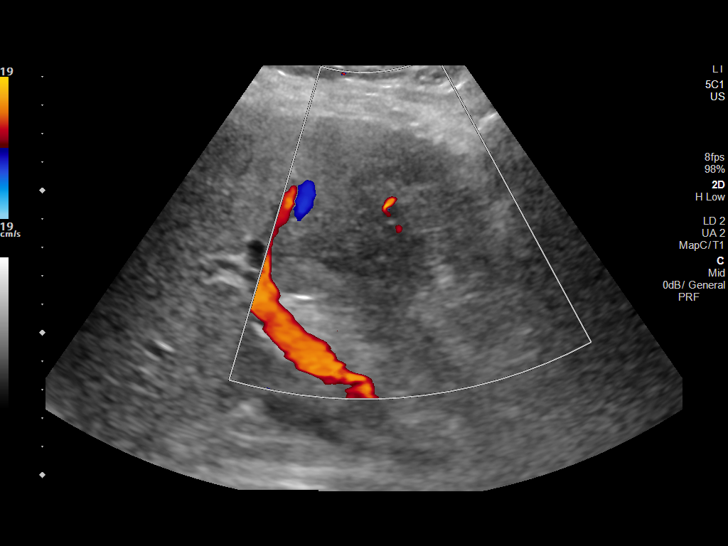
[im 8/30]
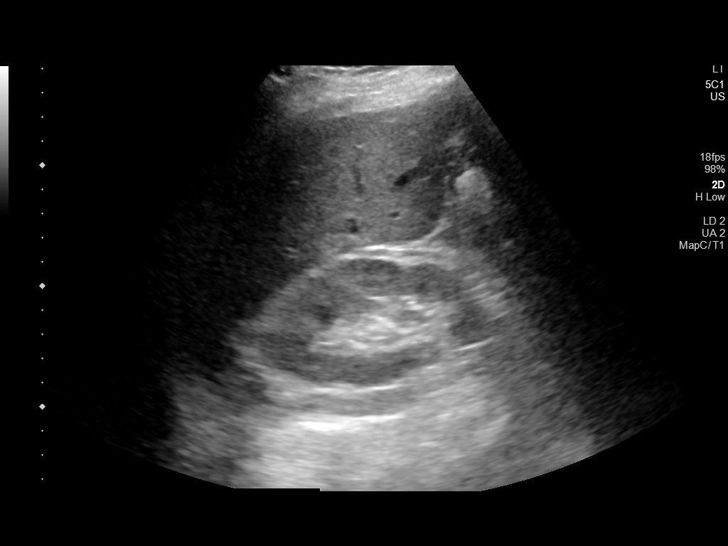
[im 10/30]
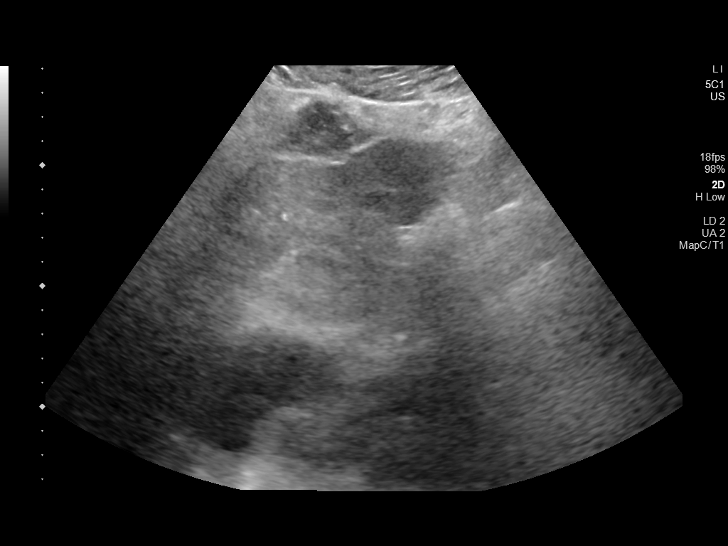
[im 13/30]
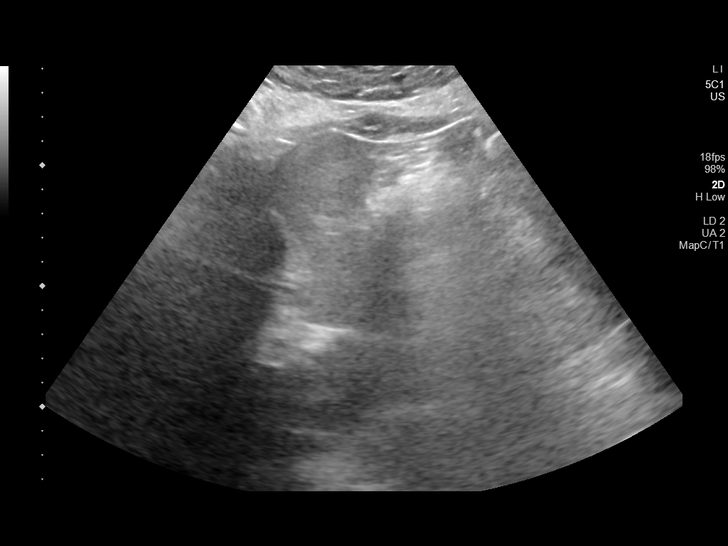
[im 15/30]
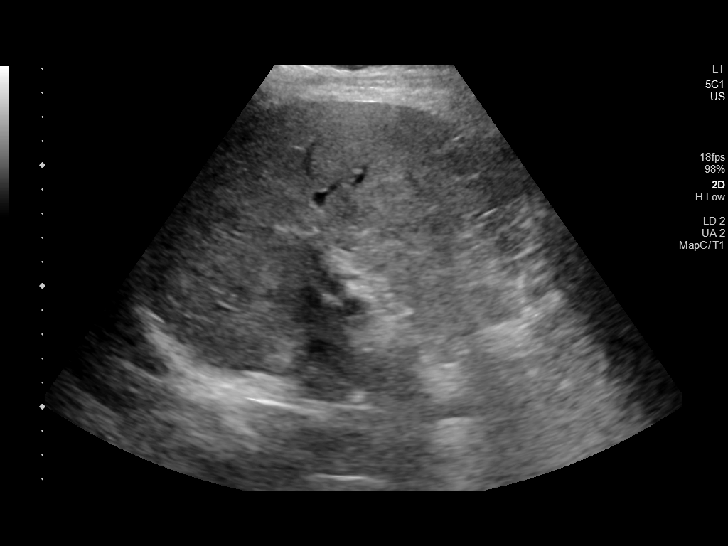
[im 17/30]
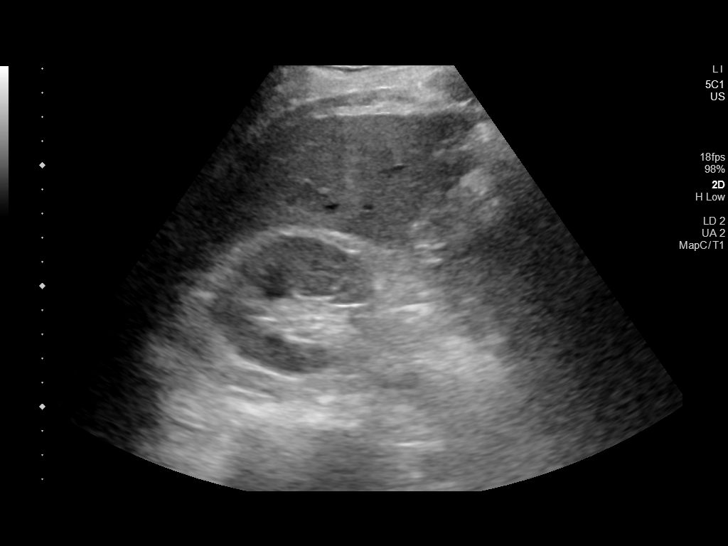
[im 20/30]
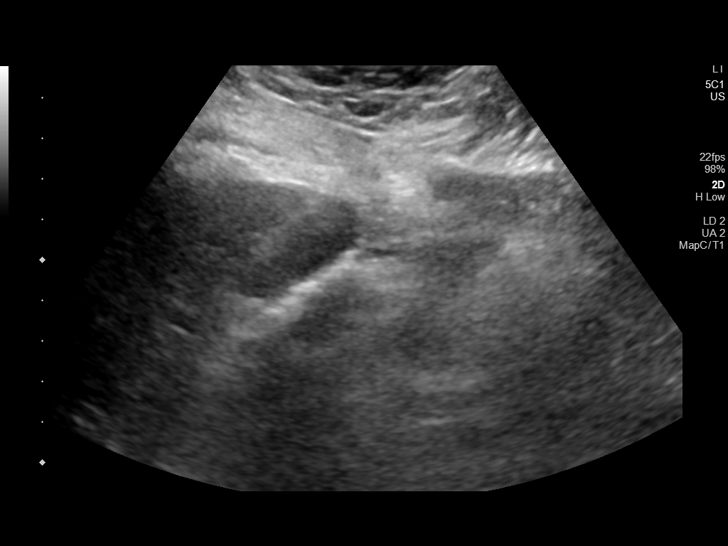
[im 22/30]
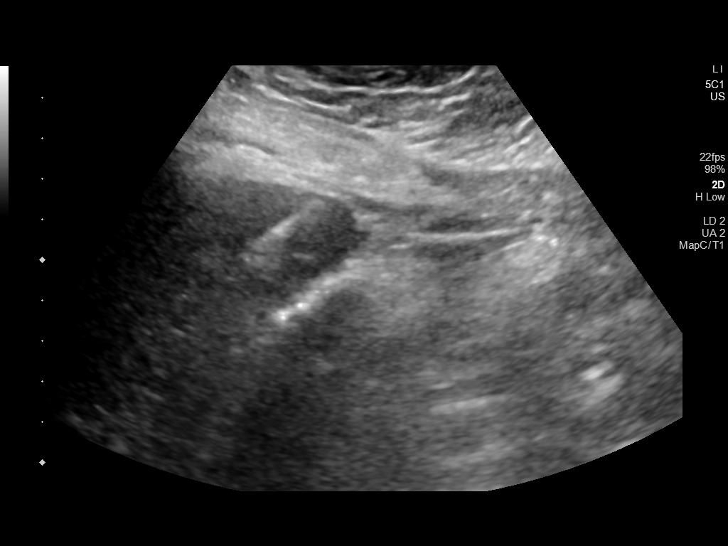
[im 25/30]
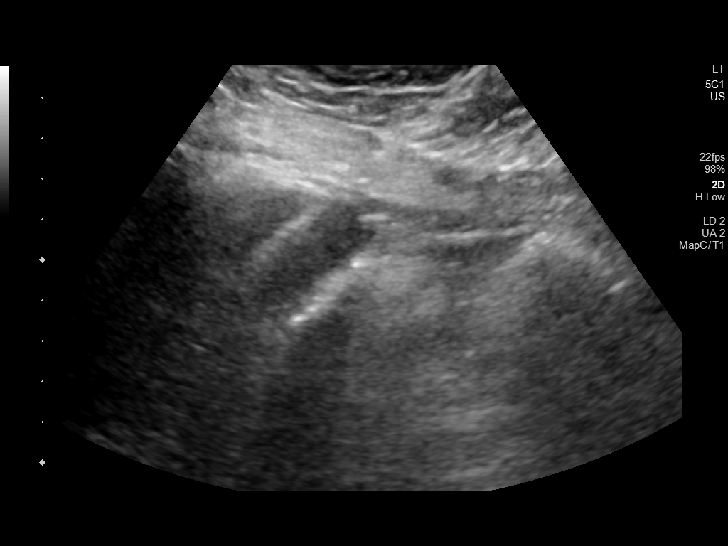
[im 27/30]
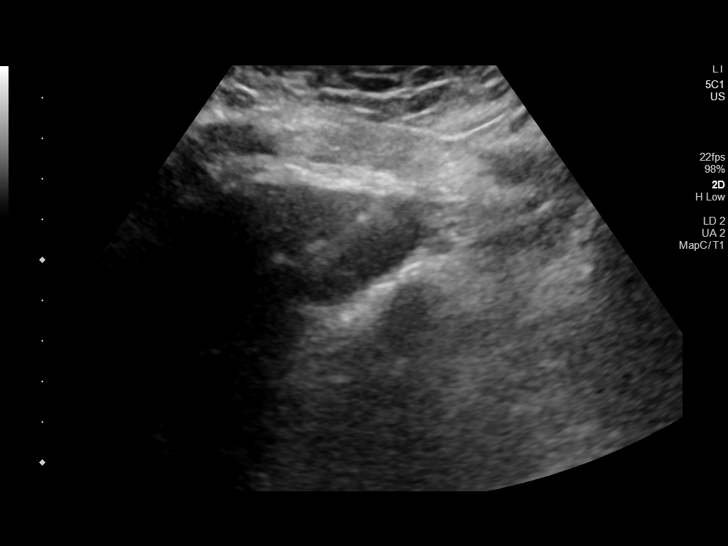
[im 30/30]
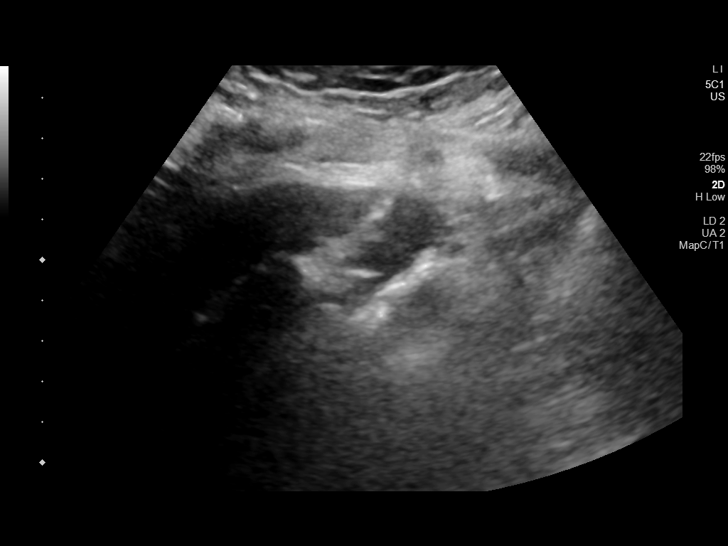

[13 of 25 positions shown; findings below may reference images not displayed]

EXAM:
ULTRASOUND BIOPSY CORE LIVER

MEDICATIONS:
None.

ANESTHESIA/SEDATION:
Moderate (conscious) sedation was employed during this procedure. A
total of Versed 2 mg and Fentanyl 100 mcg was administered
intravenously.

Moderate Sedation Time: 10 minutes. The patient's level of
consciousness and vital signs were monitored continuously by
radiology nursing throughout the procedure under my direct
supervision.

FLUOROSCOPY TIME:  None.

COMPLICATIONS:
None immediate.

PROCEDURE:
Informed written consent was obtained from the patient after a
thorough discussion of the procedural risks, benefits and
alternatives. All questions were addressed. Maximal Sterile Barrier
Technique was utilized including caps, mask, sterile gowns, sterile
gloves, sterile drape, hand hygiene and skin antiseptic. A timeout
was performed prior to the initiation of the procedure.

The liver was interrogated with ultrasound. The hepatic lesions
which are seen so clearly on CT imaging are nearly occult by
ultrasound imaging. An exophytic lesion arising from hepatic segment
2 is successfully identified. A suitable skin entry site was
selected and marked. The overlying skin was sterilely prepped and
draped in the standard fashion using chlorhexidine skin prep. Local
anesthesia was attained by infiltration with 1% lidocaine. A small
dermatotomy was made. Under real-time ultrasound guidance, a 17
gauge introducer needle was advanced into the margin of the lesion.
Multiple 18 gauge core biopsies were then obtained coaxially using
the BEO automated biopsy device. Biopsy specimens were placed
in formalin and delivered to pathology for further analysis. As the
introducer needle was removed, the biopsy tract was embolized with a
Gel-Foam slurry. The patient tolerated the procedure well. There was
no immediate complication.
IMPRESSION: 1. Hepatic lesions are nearly occult by ultrasound imaging.
2. Successful ultrasound-guided core biopsy of hepatic mass.

## 2020-03-15 MED ORDER — SODIUM CHLORIDE 0.9 % IV SOLN: INTRAVENOUS | Status: DC

## 2020-03-15 MED ORDER — FENTANYL CITRATE (PF) 100 MCG/2ML IJ SOLN
INTRAMUSCULAR | Status: AC
  Filled 2020-03-15: qty 2

## 2020-03-15 MED ORDER — LIDOCAINE HCL 1 % IJ SOLN
INTRAMUSCULAR | Status: AC
  Filled 2020-03-15: qty 20

## 2020-03-15 MED ORDER — GELATIN ABSORBABLE 12-7 MM EX MISC
CUTANEOUS | Status: AC
  Filled 2020-03-15: qty 1

## 2020-03-15 MED ORDER — LIDOCAINE HCL (PF) 1 % IJ SOLN
INTRAMUSCULAR | Status: AC | PRN
  Administered 2020-03-15: 10 mL

## 2020-03-15 MED ORDER — MIDAZOLAM HCL 2 MG/2ML IJ SOLN
INTRAMUSCULAR | Status: AC
  Filled 2020-03-15: qty 4

## 2020-03-15 NOTE — Discharge Instructions (Signed)
Liver Biopsy, Care After These instructions give you information on caring for yourself after your procedure. Your doctor may also give you more specific instructions. Call your doctor if you have any problems or questions after your procedure. What can I expect after the procedure? After the procedure, it is common to have:  Pain and soreness where the biopsy was done.  Bruising around the area where the biopsy was done.  Sleepiness and be tired for a few days. Follow these instructions at home: Medicines  Take over-the-counter and prescription medicines only as told by your doctor.  If you were prescribed an antibiotic medicine, take it as told by your doctor. Do not stop taking the antibiotic even if you start to feel better.  Do not take medicines such as aspirin and ibuprofen. These medicines can thin your blood. Do not take these medicines unless your doctor tells you to take them.  If you are taking prescription pain medicine, take actions to prevent or treat constipation. Your doctor may recommend that you: ? Drink enough fluid to keep your pee (urine) clear or pale yellow. ? Take over-the-counter or prescription medicines. ? Eat foods that are high in fiber, such as fresh fruits and vegetables, whole grains, and beans. ? Limit foods that are high in fat and processed sugars, such as fried and sweet foods. Caring for your cut  Follow instructions from your doctor about how to take care of your cuts from surgery (incisions). Make sure you: ? Wash your hands with soap and water before you change your bandage (dressing). If you cannot use soap and water, use hand sanitizer. ? Change your bandage as told by your doctor.  Leave band aid in place until tomorrow.  Check your cuts every day for signs of infection. Check for: ? Redness, swelling, or more pain. ? Fluid or blood. ? Pus or a bad smell. ? Warmth.  Do not take baths, swim, or use a hot tub until your doctor says it is  okay to do so. Activity   Rest at home for 1-2 days or as told by your doctor. ? Avoid sitting for a long time without moving. Get up to take short walks every 1-2 hours.  Return to your normal activities as told by your doctor. Ask what activities are safe for you.  Do not do these things in the first 24 hours: ? Drive. ? Use machinery. ? Take a bath or shower.  Do not lift more than 10 pounds (4.5 kg) or play contact sports for the first 2 weeks. General instructions   Do not drink alcohol in the first week after the procedure.  Have someone stay with you for at least 24 hours after the procedure.  Get your test results. Ask your doctor or the department that is doing the test: ? When will my results be ready? ? How will I get my results? ? What are my treatment options? ? What other tests do I need? ? What are my next steps?  Keep all follow-up visits as told by your doctor. This is important. Contact a doctor if:  A cut bleeds and leaves more than just a small spot of blood.  A cut is red, puffs up (swells), or hurts more than before.  Fluid or something else comes from a cut.  A cut smells bad.  You have a fever or chills. Get help right away if:  You have swelling, bloating, or pain in your belly (abdomen).  You get dizzy or faint.  You have a rash.  You feel sick to your stomach (nauseous) or throw up (vomit).  You have trouble breathing, feel short of breath, or feel faint.  Your chest hurts.  You have problems talking or seeing.  You have trouble with your balance or moving your arms or legs. Summary  After the procedure, it is common to have pain, soreness, bruising, and tiredness.  Your doctor will tell you how to take care of yourself at home. Change your bandage, take your medicines, and limit your activities as told by your doctor.  Call your doctor if you have symptoms of infection. Get help right away if your belly swells, your cut  bleeds a lot, or you have trouble talking or breathing. This information is not intended to replace advice given to you by your health care provider. Make sure you discuss any questions you have with your health care provider. Document Revised: 06/28/2017 Document Reviewed: 06/28/2017 Elsevier Patient Education  [place].      Moderate Conscious Sedation, Adult, Care After These instructions provide you with information about caring for yourself after your procedure. Your health care provider may also give you more specific instructions. Your treatment has been planned according to current medical practices, but problems sometimes occur. Call your health care provider if you have any problems or questions after your procedure. What can I expect after the procedure? After your procedure, it is common:  To feel sleepy for several hours.  To feel clumsy and have poor balance for several hours.  To have poor judgment for several hours.  To vomit if you eat too soon. Follow these instructions at home: For at least 24 hours after the procedure:   Do not: ? Participate in activities where you could fall or become injured. ? Drive. ? Use heavy machinery. ? Drink alcohol. ? Take sleeping pills or medicines that cause drowsiness. ? Make important decisions or sign legal documents. ? Take care of children on your own.  Rest. Eating and drinking  Follow the diet recommended by your health care provider.  If you vomit: ? Drink water, juice, or soup when you can drink without vomiting. ? Make sure you have little or no nausea before eating solid foods. General instructions  Have a responsible adult stay with you until you are awake and alert.  Take over-the-counter and prescription medicines only as told by your health care provider.  If you smoke, do not smoke without supervision.  Keep all follow-up visits as told by your health care provider. This is  important. Contact a health care provider if:  You keep feeling nauseous or you keep vomiting.  You feel light-headed.  You develop a rash.  You have a fever. Get help right away if:  You have trouble breathing. This information is not intended to replace advice given to you by your health care provider. Make sure you discuss any questions you have with your health care provider. Document Revised: 05/31/2017 Document Reviewed: 10/08/2015 Elsevier Patient Education  2020 [employer].

## 2020-03-15 NOTE — Procedures (Signed)
Interventional Radiology Procedure Note  Procedure: US guided biopsy of liver lesion  Complications: None  Estimated Blood Loss: None  Recommendations: - DC home   Signed,  Criselda Peaches, MD

## 2020-03-15 NOTE — Consult Note (Signed)
Chief Complaint: Patient was seen in consultation today for image guided liver lesion biopsy  Referring Physician(s): Wyatt Portela  Supervising Physician: Jacqulynn Cadet  Patient Status: [facility] - Out-pt  History of Present Illness: Marcus Callahan is a 49 y.o. male with history of stage IV renal cell carcinoma with hepatic involvement diagnosed in February of this year.  He is status post robotic assisted lap left radical nephrectomy on 08/26/2019.  He has undergone prior immunotherapy. Recent CT scan has revealed:   1. Interval increase in size of multiple hypoenhancing lesions of the liver. Findings are consistent with worsened hepatic metastatic disease. 2. No evidence of new metastatic disease in the chest, abdomen, or pelvis. 3. Unchanged nonspecific 3 mm nodule of the right lower lobe. Attention on follow-up.  4. Status post left nephrectomy. No evidence of local recurrent mass or abnormal contrast enhancement. 5. Mild, diffuse bilateral bronchial wall thickening with innumerable tiny centrilobular nodules throughout the lungs, likely smoking-related respiratory bronchiolitis.  He presents today for image guided liver lesion biopsy for further evaluation.  Past Medical History:  Diagnosis Date  . Allergy    no diagnosis per pt   . Hypertension     Past Surgical History:  Procedure Laterality Date  . CYSTOSCOPY N/A 08/26/2019   Procedure: Erlene Quan;  Surgeon: Ceasar Mons, MD;  Location: [facility]S;  Service: Urology;  Laterality: N/A;  . ROBOT ASSISTED LAPAROSCOPIC NEPHRECTOMY Left 08/26/2019   Procedure: XI ROBOTIC ASSISTED LAPAROSCOPIC NEPHRECTOMY;  Surgeon: Ceasar Mons, MD;  Location: [facility];  Service: Urology;  Laterality: Left;  . surgery as a child     thinks matbe was a hernia repair as a premature baby     Allergies: Codeine  Medications: Prior to Admission medications   Medication Sig Start Date End Date  Taking? Authorizing Provider  amLODipine (NORVASC) 10 MG tablet Take 1 tablet (10 mg total) by mouth daily. 11/23/19 12/23/19  [name], MD  INLYTA 5 MG tablet TAKE 1 TABLET TWICE A DAY 02/16/20   Wyatt Portela, MD  lisinopril-hydrochlorothiazide (ZESTORETIC) 20-25 MG tablet TAKE 1 TABLET BY MOUTH DAILY 10/31/19   [name], MD  metoprolol succinate (TOPROL-XL) 100 MG 24 hr tablet Take 1 tablet (100 mg total) by mouth daily. Take with or immediately following a meal. 11/23/19 12/23/19  [name], MD     Family History  Problem Relation Age of Onset  . Colon polyps Mother   . Colon cancer Neg Hx   . Esophageal cancer Neg Hx   . Prostate cancer Neg Hx   . Rectal cancer Neg Hx     Social History   Socioeconomic History  . Marital status: Single    Spouse name: Not on file  . Number of children: Not on file  . Years of education: Not on file  . Highest education level: Not on file  Occupational History  . Not on file  Tobacco Use  . Smoking status: Never Smoker  . Smokeless tobacco: Never Used  Vaping Use  . Vaping Use: Never used  Substance and Sexual Activity  . Alcohol use: Yes    Comment: occassionaly  . Drug use: Never  . Sexual activity: Not on file  Other Topics Concern  . Not on file  Social History Narrative  . Not on file   Social Determinants of Health   Financial Resource Strain:   . Difficulty of Paying Living Expenses: Not on file  Food  Insecurity:   . Worried About Charity fundraiser in the Last Year: Not on file  . Ran Out of Food in the Last Year: Not on file  Transportation Needs:   . Lack of Transportation (Medical): Not on file  . Lack of Transportation (Non-Medical): Not on file  Physical Activity:   . Days of Exercise per Week: Not on file  . Minutes of Exercise per Session: Not on file  Stress:   . Feeling of Stress : Not on file  Social Connections:   . Frequency of Communication with Friends and Family: Not  on file  . Frequency of Social Gatherings with Friends and Family: Not on file  . Attends Religious Services: Not on file  . Active Member of Clubs or Organizations: Not on file  . Attends Archivist Meetings: Not on file  . Marital Status: Not on file      Review of Systems:  he currently denies fever, HA, chest pain, dyspnea, cough, abdominal/back pain, nausea, vomiting or bleeding  Vital Signs: Blood pressure 143/107, temp 99.1, heart rate 86, respirations 18, O2 sat 100% room air   Physical Exam awake/alert; chest- CTA bilat; heart- RRR; abd- soft,+BS,NT; no LE edema  Imaging: CT ABDOMEN PELVIS W WO CONTRAST  Result Date: 03/01/2020 CLINICAL DATA:  Metastatic left renal cell carcinoma, status post nephrectomy, ongoing immunotherapy, assess treatment response EXAM: CT CHEST WITH CONTRAST CT ABDOMEN AND PELVIS WITH AND WITHOUT CONTRAST TECHNIQUE: Multidetector CT imaging of the chest was performed during intravenous contrast administration. Multidetector CT imaging of the abdomen and pelvis was performed following the standard protocol before and during bolus administration of intravenous contrast. CONTRAST:  147mL OMNIPAQUE IOHEXOL 300 MG/ML SOLN, additional oral enteric contrast COMPARISON:  12/04/2019 FINDINGS: CT CHEST FINDINGS Cardiovascular: No significant vascular findings. Normal heart size. No pericardial effusion. Mediastinum/Nodes: No enlarged mediastinal, hilar, or axillary lymph nodes. Thyroid gland, trachea, and esophagus demonstrate no significant findings. Lungs/Pleura: Mild, diffuse bilateral bronchial wall thickening. Innumerable tiny centrilobular nodules throughout the lungs. Unchanged discrete 3 mm nodule of the right lower lobe (series 15, image 100). No pleural effusion or pneumothorax. Musculoskeletal: No chest wall mass or suspicious bone lesions identified. CT ABDOMEN PELVIS FINDINGS Hepatobiliary: There are numerous hypoenhancing lesions of the liver  parenchyma, which are increased in size compared to prior examination. An index lesion in the anterior right lobe of the liver measures 3.5 x 2.8 cm, previously 2.2 x 2.2 cm (series 11, image 86). No gallstones, gallbladder wall thickening, or biliary dilatation. Pancreas: Unremarkable. No pancreatic ductal dilatation or surrounding inflammatory changes. Spleen: Normal in size without significant abnormality. Adrenals/Urinary Tract: Adrenal glands are unremarkable. Status post left nephrectomy. No evidence of local recurrent mass or abnormal contrast enhancement. The right kidney is normal. No hydronephrosis. Bladder is unremarkable. Stomach/Bowel: Stomach is within normal limits. Appendix appears normal. No evidence of bowel wall thickening, distention, or inflammatory changes. Vascular/Lymphatic: No significant vascular findings are present. No enlarged abdominal or pelvic lymph nodes. Reproductive: No mass or other abnormality. Other: Fat containing left inguinal hernia. No abdominopelvic ascites. Musculoskeletal: No acute or significant osseous findings. IMPRESSION: 1. Interval increase in size of multiple hypoenhancing lesions of the liver. Findings are consistent with worsened hepatic metastatic disease. 2. No evidence of new metastatic disease in the chest, abdomen, or pelvis. 3. Unchanged nonspecific 3 mm nodule of the right lower lobe. Attention on follow-up. 4. Status post left nephrectomy. No evidence of local recurrent mass or abnormal contrast enhancement.  5. Mild, diffuse bilateral bronchial wall thickening with innumerable tiny centrilobular nodules throughout the lungs, likely smoking-related respiratory bronchiolitis. Electronically Signed   By: Eddie Candle M.D.   On: 03/01/2020 08:47   CT Chest W Contrast  Result Date: 03/01/2020 CLINICAL DATA:  Metastatic left renal cell carcinoma, status post nephrectomy, ongoing immunotherapy, assess treatment response EXAM: CT CHEST WITH CONTRAST CT  ABDOMEN AND PELVIS WITH AND WITHOUT CONTRAST TECHNIQUE: Multidetector CT imaging of the chest was performed during intravenous contrast administration. Multidetector CT imaging of the abdomen and pelvis was performed following the standard protocol before and during bolus administration of intravenous contrast. CONTRAST:  182mL OMNIPAQUE IOHEXOL 300 MG/ML SOLN, additional oral enteric contrast COMPARISON:  12/04/2019 FINDINGS: CT CHEST FINDINGS Cardiovascular: No significant vascular findings. Normal heart size. No pericardial effusion. Mediastinum/Nodes: No enlarged mediastinal, hilar, or axillary lymph nodes. Thyroid gland, trachea, and esophagus demonstrate no significant findings. Lungs/Pleura: Mild, diffuse bilateral bronchial wall thickening. Innumerable tiny centrilobular nodules throughout the lungs. Unchanged discrete 3 mm nodule of the right lower lobe (series 15, image 100). No pleural effusion or pneumothorax. Musculoskeletal: No chest wall mass or suspicious bone lesions identified. CT ABDOMEN PELVIS FINDINGS Hepatobiliary: There are numerous hypoenhancing lesions of the liver parenchyma, which are increased in size compared to prior examination. An index lesion in the anterior right lobe of the liver measures 3.5 x 2.8 cm, previously 2.2 x 2.2 cm (series 11, image 86). No gallstones, gallbladder wall thickening, or biliary dilatation. Pancreas: Unremarkable. No pancreatic ductal dilatation or surrounding inflammatory changes. Spleen: Normal in size without significant abnormality. Adrenals/Urinary Tract: Adrenal glands are unremarkable. Status post left nephrectomy. No evidence of local recurrent mass or abnormal contrast enhancement. The right kidney is normal. No hydronephrosis. Bladder is unremarkable. Stomach/Bowel: Stomach is within normal limits. Appendix appears normal. No evidence of bowel wall thickening, distention, or inflammatory changes. Vascular/Lymphatic: No significant vascular findings  are present. No enlarged abdominal or pelvic lymph nodes. Reproductive: No mass or other abnormality. Other: Fat containing left inguinal hernia. No abdominopelvic ascites. Musculoskeletal: No acute or significant osseous findings. IMPRESSION: 1. Interval increase in size of multiple hypoenhancing lesions of the liver. Findings are consistent with worsened hepatic metastatic disease. 2. No evidence of new metastatic disease in the chest, abdomen, or pelvis. 3. Unchanged nonspecific 3 mm nodule of the right lower lobe. Attention on follow-up. 4. Status post left nephrectomy. No evidence of local recurrent mass or abnormal contrast enhancement. 5. Mild, diffuse bilateral bronchial wall thickening with innumerable tiny centrilobular nodules throughout the lungs, likely smoking-related respiratory bronchiolitis. Electronically Signed   By: Eddie Candle M.D.   On: 03/01/2020 08:47    Labs:  CBC: Recent Labs    01/01/20 1037 01/21/20 1131 02/12/20 1225 03/03/20 0808  WBC 8.2 7.4 8.4 7.2  HGB 11.6* 11.9* 11.3* 11.5*  HCT 36.4* 36.6* 35.4* 35.6*  PLT 344 369 377 427*    COAGS: No results for input(s): INR, APTT in the last 8760 hours.  BMP: Recent Labs    01/01/20 1037 01/21/20 1131 02/12/20 1225 03/03/20 0808  NA 139 137 138 138  K 3.6 3.7 3.7 3.6  CL 108 105 106 105  CO2 18* 20* 18* 18*  GLUCOSE 130* 87 120* 106*  BUN 14 18 21* 18  CALCIUM 9.4 9.8 10.3 10.4*  CREATININE 1.43* 1.44* 1.51* 1.45*  GFRNONAA 56* 56* 53* 55*  GFRAA >60 >60 >60 >60    LIVER FUNCTION TESTS: Recent Labs    01/01/20 1037  01/21/20 1131 02/12/20 1225 03/03/20 0808  BILITOT 0.3 0.5 0.3 0.5  AST 20 27 36 30  ALT 21 25 31 23   ALKPHOS 76 77 79 89  PROT 7.8 8.1 7.9 8.1  ALBUMIN 4.0 4.2 4.1 4.1    TUMOR MARKERS: No results for input(s): AFPTM, CEA, CA199, CHROMGRNA in the last 8760 hours.  Assessment and Plan: 49 y.o. male with history of stage IV renal cell carcinoma with hepatic involvement  diagnosed in February of this year.  He is status post robotic assisted lap left radical nephrectomy on 08/26/2019.  He has undergone prior immunotherapy. Recent CT scan has revealed:   1. Interval increase in size of multiple hypoenhancing lesions of the liver. Findings are consistent with worsened hepatic metastatic disease. 2. No evidence of new metastatic disease in the chest, abdomen, or pelvis. 3. Unchanged nonspecific 3 mm nodule of the right lower lobe. Attention on follow-up.  4. Status post left nephrectomy. No evidence of local recurrent mass or abnormal contrast enhancement. 5. Mild, diffuse bilateral bronchial wall thickening with innumerable tiny centrilobular nodules throughout the lungs, likely smoking-related respiratory bronchiolitis.  He presents today for image guided liver lesion biopsy for further evaluation.Risks and benefits of procedure was discussed with the patient  including, but not limited to bleeding, infection, damage to adjacent structures or low yield requiring additional tests.  All of the questions were answered and there is agreement to proceed.  Consent signed and in chart.  Labs pending   Thank you for this interesting consult.  I greatly enjoyed meeting Marcus Callahan and look forward to participating in their care.  A copy of this report was sent to the requesting provider on this date.  Electronically Signed: D. Rowe Robert, PA-C 03/15/2020, 11:16 AM   I spent a total of  25 minutes   in face to face in clinical consultation, greater than 50% of which was counseling/coordinating care for image guided liver lesion biopsy

## 2020-03-16 ENCOUNTER — Telehealth: Payer: Self-pay

## 2020-03-16 LAB — SURGICAL PATHOLOGY

## 2020-03-16 NOTE — Telephone Encounter (Signed)
Sent email to [employer] for [facility] one testing.

## 2020-03-16 NOTE — Telephone Encounter (Signed)
-----   Message from Wyatt Portela, MD sent at 03/16/2020  8:36 AM EDT ----- Please call path [phone]) and ask for this specimen to be sent to [facility] One testing.  Diagnosis is stage IV renal cancer, Code is C64.9

## 2020-03-16 NOTE — Progress Notes (Signed)
Sent email to [employer] for foundation one testing per Dr Alen Blew.

## 2020-03-17 ENCOUNTER — Telehealth: Payer: Self-pay | Admitting: Oncology

## 2020-03-17 NOTE — Telephone Encounter (Signed)
Scheduled per los, patient has been called and notified. 

## 2020-03-18 ENCOUNTER — Inpatient Hospital Stay: Payer: BC Managed Care – PPO

## 2020-03-18 ENCOUNTER — Encounter: Payer: Self-pay | Admitting: Oncology

## 2020-03-18 ENCOUNTER — Other Ambulatory Visit: Payer: Self-pay

## 2020-03-18 VITALS — BP 132/84 | HR 74 | Temp 98.6°F | Resp 18

## 2020-03-18 DIAGNOSIS — Z905 Acquired absence of kidney: Secondary | ICD-10-CM | POA: Diagnosis not present

## 2020-03-18 DIAGNOSIS — I10 Essential (primary) hypertension: Secondary | ICD-10-CM | POA: Diagnosis not present

## 2020-03-18 DIAGNOSIS — Z5112 Encounter for antineoplastic immunotherapy: Secondary | ICD-10-CM | POA: Diagnosis not present

## 2020-03-18 DIAGNOSIS — C642 Malignant neoplasm of left kidney, except renal pelvis: Secondary | ICD-10-CM

## 2020-03-18 DIAGNOSIS — Z79899 Other long term (current) drug therapy: Secondary | ICD-10-CM | POA: Diagnosis not present

## 2020-03-18 DIAGNOSIS — C787 Secondary malignant neoplasm of liver and intrahepatic bile duct: Secondary | ICD-10-CM | POA: Diagnosis not present

## 2020-03-18 LAB — CBC WITH DIFFERENTIAL (CANCER CENTER ONLY)
Abs Immature Granulocytes: 0.03 10*3/uL (ref 0.00–0.07)
Basophils Absolute: 0.1 10*3/uL (ref 0.0–0.1)
Basophils Relative: 1 %
Eosinophils Absolute: 0.2 10*3/uL (ref 0.0–0.5)
Eosinophils Relative: 2 %
HCT: 35.9 % — ABNORMAL LOW (ref 39.0–52.0)
Hemoglobin: 11.5 g/dL — ABNORMAL LOW (ref 13.0–17.0)
Immature Granulocytes: 0 %
Lymphocytes Relative: 33 %
Lymphs Abs: 2.6 10*3/uL (ref 0.7–4.0)
MCH: 25.3 pg — ABNORMAL LOW (ref 26.0–34.0)
MCHC: 32 g/dL (ref 30.0–36.0)
MCV: 79.1 fL — ABNORMAL LOW (ref 80.0–100.0)
Monocytes Absolute: 0.8 10*3/uL (ref 0.1–1.0)
Monocytes Relative: 10 %
Neutro Abs: 4.2 10*3/uL (ref 1.7–7.7)
Neutrophils Relative %: 54 %
Platelet Count: 372 10*3/uL (ref 150–400)
RBC: 4.54 MIL/uL (ref 4.22–5.81)
RDW: 15 % (ref 11.5–15.5)
WBC Count: 7.8 10*3/uL (ref 4.0–10.5)
nRBC: 0 % (ref 0.0–0.2)

## 2020-03-18 LAB — CMP (CANCER CENTER ONLY)
ALT: 23 U/L (ref 0–44)
AST: 26 U/L (ref 15–41)
Albumin: 4.2 g/dL (ref 3.5–5.0)
Alkaline Phosphatase: 83 U/L (ref 38–126)
Anion gap: 11 (ref 5–15)
BUN: 18 mg/dL (ref 6–20)
CO2: 19 mmol/L — ABNORMAL LOW (ref 22–32)
Calcium: 9.6 mg/dL (ref 8.9–10.3)
Chloride: 108 mmol/L (ref 98–111)
Creatinine: 1.56 mg/dL — ABNORMAL HIGH (ref 0.61–1.24)
GFR, Est AFR Am: 59 mL/min — ABNORMAL LOW (ref >60)
GFR, Estimated: 51 mL/min — ABNORMAL LOW (ref >60)
Glucose, Bld: 93 mg/dL (ref 70–99)
Potassium: 3.7 mmol/L (ref 3.5–5.1)
Sodium: 138 mmol/L (ref 135–145)
Total Bilirubin: 0.3 mg/dL (ref 0.3–1.2)
Total Protein: 8.4 g/dL — ABNORMAL HIGH (ref 6.5–8.1)

## 2020-03-18 LAB — TSH: TSH: 4.874 u[IU]/mL — ABNORMAL HIGH (ref 0.320–4.118)

## 2020-03-18 MED ORDER — FAMOTIDINE IN NACL 20-0.9 MG/50ML-% IV SOLN
INTRAVENOUS | Status: AC
  Filled 2020-03-18: qty 50

## 2020-03-18 MED ORDER — SODIUM CHLORIDE 0.9 % IV SOLN
Freq: Once | INTRAVENOUS | Status: AC
  Filled 2020-03-18: qty 250

## 2020-03-18 MED ORDER — FAMOTIDINE IN NACL 20-0.9 MG/50ML-% IV SOLN
20.0000 mg | Freq: Once | INTRAVENOUS | Status: AC
  Administered 2020-03-18: 20 mg via INTRAVENOUS

## 2020-03-18 MED ORDER — SODIUM CHLORIDE 0.9 % IV SOLN
300.0000 mg | Freq: Once | INTRAVENOUS | Status: AC
  Administered 2020-03-18: 300 mg via INTRAVENOUS
  Filled 2020-03-18: qty 24

## 2020-03-18 MED ORDER — DIPHENHYDRAMINE HCL 50 MG/ML IJ SOLN
INTRAMUSCULAR | Status: AC
  Filled 2020-03-18: qty 1

## 2020-03-18 MED ORDER — DIPHENHYDRAMINE HCL 50 MG/ML IJ SOLN
25.0000 mg | Freq: Once | INTRAMUSCULAR | Status: AC
  Administered 2020-03-18: 25 mg via INTRAVENOUS

## 2020-03-18 MED ORDER — SODIUM CHLORIDE 0.9 % IV SOLN
1.0000 mg/kg | Freq: Once | INTRAVENOUS | Status: AC
  Administered 2020-03-18: 100 mg via INTRAVENOUS
  Filled 2020-03-18: qty 20

## 2020-03-18 NOTE — Progress Notes (Signed)
Pre Yervoy patient monitoring checklist completed - all pt responses are  "No".

## 2020-03-18 NOTE — Patient Instructions (Addendum)
[facility] Discharge Instructions for Patients Receiving Chemotherapy  Today you received the following IMMUNOTHERAPY  agents Ipilimumab and Nivolumab  To help prevent nausea and vomiting after your treatment, we encourage you to take your nausea medication as directed   If you develop nausea and vomiting that is not controlled by your nausea medication, call the clinic.   BELOW ARE SYMPTOMS THAT SHOULD BE REPORTED IMMEDIATELY:  *FEVER GREATER THAN 100.5 F  *CHILLS WITH OR WITHOUT FEVER  NAUSEA AND VOMITING THAT IS NOT CONTROLLED WITH YOUR NAUSEA MEDICATION  *UNUSUAL SHORTNESS OF BREATH  *UNUSUAL BRUISING OR BLEEDING  TENDERNESS IN MOUTH AND THROAT WITH OR WITHOUT PRESENCE OF ULCERS  *URINARY PROBLEMS  *BOWEL PROBLEMS- cramping,diarrhea, bloody stools ,change in bowel pattern  UNUSUAL RASH Items with * indicate a potential emergency and should be followed up as soon as possible.  Feel free to call the clinic should you have any questions or concerns. The clinic phone number is [phone].  Please show the [facility] at check-in to the Emergency Department and triage nurse.

## 2020-03-22 ENCOUNTER — Encounter: Payer: Self-pay | Admitting: Oncology

## 2020-03-25 DIAGNOSIS — C659 Malignant neoplasm of unspecified renal pelvis: Secondary | ICD-10-CM | POA: Diagnosis not present

## 2020-03-28 ENCOUNTER — Encounter (HOSPITAL_COMMUNITY): Payer: Self-pay | Admitting: Oncology

## 2020-03-29 ENCOUNTER — Telehealth: Payer: Self-pay

## 2020-03-29 NOTE — Telephone Encounter (Signed)
Called patient and let him know that he is to stop Inlyta. Patient also made aware that Dr. Alen Blew will call him later to discuss biopsy results.

## 2020-03-29 NOTE — Telephone Encounter (Signed)
-----   Message from Wyatt Portela, MD sent at 03/29/2020 10:46 AM EDT ----- He is to stop Inlyta.  I called him earlier to discuss his biopsy results.  Please let him know that I will call him later to discuss it further.  Thanks ----- Message ----- From: Tami Lin, RN Sent: 03/29/2020  10:30 AM EDT To: Wyatt Portela, MD  Patient called and wants to know results of his most recent liver biopsy. He also wants to confirm that he is not supposed to be taking Inlyta. He said at his last visit he thought you said to stop Inlyta when he starts Opdivo/Yervoy. He started D1C1 Opdivo/Yevoy on 9/17.  Lanelle Bal

## 2020-04-08 ENCOUNTER — Other Ambulatory Visit: Payer: Self-pay

## 2020-04-08 ENCOUNTER — Inpatient Hospital Stay: Payer: BC Managed Care – PPO

## 2020-04-08 ENCOUNTER — Inpatient Hospital Stay: Payer: BC Managed Care – PPO | Attending: Oncology

## 2020-04-08 ENCOUNTER — Inpatient Hospital Stay (HOSPITAL_BASED_OUTPATIENT_CLINIC_OR_DEPARTMENT_OTHER): Payer: BC Managed Care – PPO | Admitting: Oncology

## 2020-04-08 VITALS — BP 135/71 | HR 75 | Temp 98.1°F | Resp 18 | Wt 234.7 lb

## 2020-04-08 DIAGNOSIS — C642 Malignant neoplasm of left kidney, except renal pelvis: Secondary | ICD-10-CM | POA: Diagnosis not present

## 2020-04-08 DIAGNOSIS — C787 Secondary malignant neoplasm of liver and intrahepatic bile duct: Secondary | ICD-10-CM | POA: Diagnosis not present

## 2020-04-08 DIAGNOSIS — Z79899 Other long term (current) drug therapy: Secondary | ICD-10-CM | POA: Insufficient documentation

## 2020-04-08 DIAGNOSIS — Z7952 Long term (current) use of systemic steroids: Secondary | ICD-10-CM | POA: Insufficient documentation

## 2020-04-08 DIAGNOSIS — C7931 Secondary malignant neoplasm of brain: Secondary | ICD-10-CM | POA: Diagnosis not present

## 2020-04-08 DIAGNOSIS — Z5112 Encounter for antineoplastic immunotherapy: Secondary | ICD-10-CM | POA: Diagnosis not present

## 2020-04-08 DIAGNOSIS — I10 Essential (primary) hypertension: Secondary | ICD-10-CM | POA: Diagnosis not present

## 2020-04-08 LAB — CMP (CANCER CENTER ONLY)
ALT: 53 U/L — ABNORMAL HIGH (ref 0–44)
AST: 74 U/L — ABNORMAL HIGH (ref 15–41)
Albumin: 3.7 g/dL (ref 3.5–5.0)
Alkaline Phosphatase: 144 U/L — ABNORMAL HIGH (ref 38–126)
Anion gap: 10 (ref 5–15)
BUN: 23 mg/dL — ABNORMAL HIGH (ref 6–20)
CO2: 20 mmol/L — ABNORMAL LOW (ref 22–32)
Calcium: 9.7 mg/dL (ref 8.9–10.3)
Chloride: 106 mmol/L (ref 98–111)
Creatinine: 1.71 mg/dL — ABNORMAL HIGH (ref 0.61–1.24)
GFR, Estimated: 45 mL/min — ABNORMAL LOW (ref >60)
Glucose, Bld: 113 mg/dL — ABNORMAL HIGH (ref 70–99)
Potassium: 4 mmol/L (ref 3.5–5.1)
Sodium: 136 mmol/L (ref 135–145)
Total Bilirubin: 0.4 mg/dL (ref 0.3–1.2)
Total Protein: 7.6 g/dL (ref 6.5–8.1)

## 2020-04-08 LAB — CBC WITH DIFFERENTIAL (CANCER CENTER ONLY)
Abs Immature Granulocytes: 0.06 10*3/uL (ref 0.00–0.07)
Basophils Absolute: 0.1 10*3/uL (ref 0.0–0.1)
Basophils Relative: 0 %
Eosinophils Absolute: 0.2 10*3/uL (ref 0.0–0.5)
Eosinophils Relative: 2 %
HCT: 30.9 % — ABNORMAL LOW (ref 39.0–52.0)
Hemoglobin: 9.7 g/dL — ABNORMAL LOW (ref 13.0–17.0)
Immature Granulocytes: 1 %
Lymphocytes Relative: 16 %
Lymphs Abs: 1.8 10*3/uL (ref 0.7–4.0)
MCH: 24.5 pg — ABNORMAL LOW (ref 26.0–34.0)
MCHC: 31.4 g/dL (ref 30.0–36.0)
MCV: 78 fL — ABNORMAL LOW (ref 80.0–100.0)
Monocytes Absolute: 1.1 10*3/uL — ABNORMAL HIGH (ref 0.1–1.0)
Monocytes Relative: 10 %
Neutro Abs: 8.2 10*3/uL — ABNORMAL HIGH (ref 1.7–7.7)
Neutrophils Relative %: 71 %
Platelet Count: 446 10*3/uL — ABNORMAL HIGH (ref 150–400)
RBC: 3.96 MIL/uL — ABNORMAL LOW (ref 4.22–5.81)
RDW: 14.3 % (ref 11.5–15.5)
WBC Count: 11.3 10*3/uL — ABNORMAL HIGH (ref 4.0–10.5)
nRBC: 0 % (ref 0.0–0.2)

## 2020-04-08 LAB — TSH: TSH: 2.827 u[IU]/mL (ref 0.320–4.118)

## 2020-04-08 MED ORDER — FAMOTIDINE IN NACL 20-0.9 MG/50ML-% IV SOLN
INTRAVENOUS | Status: AC
  Filled 2020-04-08: qty 50

## 2020-04-08 MED ORDER — DIPHENHYDRAMINE HCL 50 MG/ML IJ SOLN
25.0000 mg | Freq: Once | INTRAMUSCULAR | Status: AC
  Administered 2020-04-08: 25 mg via INTRAVENOUS

## 2020-04-08 MED ORDER — DIPHENHYDRAMINE HCL 50 MG/ML IJ SOLN
INTRAMUSCULAR | Status: AC
  Filled 2020-04-08: qty 1

## 2020-04-08 MED ORDER — SODIUM CHLORIDE 0.9 % IV SOLN
300.0000 mg | Freq: Once | INTRAVENOUS | Status: AC
  Administered 2020-04-08: 300 mg via INTRAVENOUS
  Filled 2020-04-08: qty 24

## 2020-04-08 MED ORDER — SODIUM CHLORIDE 0.9 % IV SOLN
1.0000 mg/kg | Freq: Once | INTRAVENOUS | Status: AC
  Administered 2020-04-08: 100 mg via INTRAVENOUS
  Filled 2020-04-08: qty 20

## 2020-04-08 MED ORDER — FAMOTIDINE IN NACL 20-0.9 MG/50ML-% IV SOLN
20.0000 mg | Freq: Once | INTRAVENOUS | Status: AC
  Administered 2020-04-08: 20 mg via INTRAVENOUS

## 2020-04-08 MED ORDER — SODIUM CHLORIDE 0.9 % IV SOLN
Freq: Once | INTRAVENOUS | Status: AC
  Filled 2020-04-08: qty 250

## 2020-04-08 NOTE — Patient Instructions (Signed)
[facility] Discharge Instructions for Patients Receiving Chemotherapy  Today you received the following chemotherapy agents: Opdivo, Yervoy  To help prevent nausea and vomiting after your treatment, we encourage you to take your nausea medication as directed.    If you develop nausea and vomiting that is not controlled by your nausea medication, call the clinic.   BELOW ARE SYMPTOMS THAT SHOULD BE REPORTED IMMEDIATELY:  *FEVER GREATER THAN 100.5 F  *CHILLS WITH OR WITHOUT FEVER  NAUSEA AND VOMITING THAT IS NOT CONTROLLED WITH YOUR NAUSEA MEDICATION  *UNUSUAL SHORTNESS OF BREATH  *UNUSUAL BRUISING OR BLEEDING  TENDERNESS IN MOUTH AND THROAT WITH OR WITHOUT PRESENCE OF ULCERS  *URINARY PROBLEMS  *BOWEL PROBLEMS  UNUSUAL RASH Items with * indicate a potential emergency and should be followed up as soon as possible.  Feel free to call the clinic should you have any questions or concerns. The clinic phone number is [phone].  Please show the CHEMO ALERT CARD at check-in to the Emergency Department and triage nurse.   

## 2020-04-08 NOTE — Progress Notes (Signed)
Hematology and Oncology Follow Up Visit  Marcus Callahan [id number] 1970-12-20 49 y.o. 04/08/2020 10:26 AM Marcus Callahan, MDSagardia, [place], [place]   Principle Diagnosis: 49 year old man with clear-cell renal cell carcinoma diagnosed in February 2021.  He was found to have stage IV disease with hepatic involvement.   Prior Therapy:  He is status post robotic assisted laparoscopic left radical nephrectomy completed on August 26, 2019.  Final pathology indicated T3a tumor with invasion into the perirenal soft tissue, renal pelvis and segmental renal vein and renal sinus.  Axitinib 5 mg twice a day with Pembrolizumab 200 mg every 3 weeks started on September 18, 2019.  He completed 8 cycles of therapy in August 2021 and developed progression of disease.   Current therapy: Ipilimumab and nivolumab started on March 18, 2020.  He is here for cycle 2 of therapy.  Interim History:  Marcus Callahan presents today for a follow-up visit.  Since the last visit, he completed a hepatic biopsy which confirmed the presence of clear-cell renal cell carcinoma.  He has tolerated therapy with nivolumab and ipilimumab without any new complaints.  He denies any nausea, vomiting, diarrhea or respiratory complaints.  His performance status quality of life remained excellent.  Continues to work full-time.     Medications: Updated on review. Current Outpatient Medications  Medication Sig Dispense Refill   lisinopril-hydrochlorothiazide (ZESTORETIC) 20-25 MG tablet TAKE 1 TABLET BY MOUTH DAILY 90 tablet 1   amLODipine (NORVASC) 10 MG tablet Take 1 tablet (10 mg total) by mouth daily. 90 tablet 2   INLYTA 5 MG tablet TAKE 1 TABLET TWICE A DAY 60 tablet 1   metoprolol succinate (TOPROL-XL) 100 MG 24 hr tablet Take 1 tablet (100 mg total) by mouth daily. Take with or immediately following a meal. 90 tablet 2   No current facility-administered medications for this visit.     Allergies:   Allergies  Allergen Reactions   Codeine Other (See Comments)    Nose bleeds      Physical Exam:   Blood pressure 135/71, pulse 75, temperature 98.1 F (36.7 C), temperature source Tympanic, resp. rate 18, weight 234 lb 11.2 oz (106.5 kg), SpO2 100 %.      ECOG: 0    General appearance: Alert, awake without any distress. Head: Atraumatic without abnormalities Oropharynx: Without any thrush or ulcers. Eyes: No scleral icterus. Lymph nodes: No lymphadenopathy noted in the cervical, supraclavicular, or axillary nodes Heart:regular rate and rhythm, without any murmurs or gallops.   Lung: Clear to auscultation without any rhonchi, wheezes or dullness to percussion. Abdomin: Soft, nontender without any shifting dullness or ascites. Musculoskeletal: No clubbing or cyanosis. Neurological: No motor or sensory deficits. Skin: No rashes or lesions.             Lab Results: Lab Results  Component Value Date   WBC 11.3 (H) 04/08/2020   HGB 9.7 (L) 04/08/2020   HCT 30.9 (L) 04/08/2020   MCV 78.0 (L) 04/08/2020   PLT 446 (H) 04/08/2020     Chemistry      Component Value Date/Time   NA 138 03/18/2020 1133   NA 141 09/28/2019 1517   K 3.7 03/18/2020 1133   CL 108 03/18/2020 1133   CO2 19 (L) 03/18/2020 1133   BUN 18 03/18/2020 1133   BUN 22 09/28/2019 1517   CREATININE 1.56 (H) 03/18/2020 1133      Component Value Date/Time   CALCIUM 9.6 03/18/2020 1133   ALKPHOS 83 03/18/2020  1133   AST 26 03/18/2020 1133   ALT 23 03/18/2020 1133   BILITOT 0.3 03/18/2020 1133          Impression and Plan:  49 year old man with:  1.  Clear-cell renal cell carcinoma diagnosed in February 2021.  He was found to have stage IV disease with hepatic involvement.  The natural course of this disease was updated and treatment options were reviewed.  Biopsy results was also discussed which confirmed the presence of renal cell carcinoma with similar histology.  Risks and  benefits of continuing ipilimumab and nivolumab were discussed at this time.  Alternative options including oral targeted therapy with cabozantinib would be a different salvage option.  The plan is to complete 4 cycles of therapy and repeat imaging studies at that time.  2.  Hypertension: Blood pressure is within normal range off of axitinib at this time.  3.  Immune mediated complications.  I continue to reiterate potential complication associated with combination immunotherapy.  These include autoimmune hepatitis, thyroid disease, pneumonitis among others.  4.  Nausea prophylaxis: No nausea or vomiting reported at this time.  Compazine is available to him.  5.  IV access: No issues reported with his peripheral veins and Port-A-Cath option will be deferred for the time being.  6.  Follow-up: 3 weeks for the next cycle of therapy.   30 minutes were dedicated to this encounter.  Time was spent on reviewing his disease status, discussing treatment options and future plan of care review.    Zola Button, MD 10/8/202110:26 AM

## 2020-04-08 NOTE — Progress Notes (Signed)
Per Dr. Alen Blew, [place] to proceed with treatment with elevated creatinine.

## 2020-04-21 ENCOUNTER — Encounter (HOSPITAL_COMMUNITY): Payer: Self-pay

## 2020-04-21 ENCOUNTER — Other Ambulatory Visit: Payer: Self-pay

## 2020-04-21 DIAGNOSIS — Z20822 Contact with and (suspected) exposure to covid-19: Secondary | ICD-10-CM | POA: Diagnosis not present

## 2020-04-21 DIAGNOSIS — C649 Malignant neoplasm of unspecified kidney, except renal pelvis: Secondary | ICD-10-CM | POA: Insufficient documentation

## 2020-04-21 DIAGNOSIS — C7931 Secondary malignant neoplasm of brain: Principal | ICD-10-CM | POA: Insufficient documentation

## 2020-04-21 DIAGNOSIS — R531 Weakness: Secondary | ICD-10-CM | POA: Diagnosis not present

## 2020-04-21 DIAGNOSIS — I619 Nontraumatic intracerebral hemorrhage, unspecified: Secondary | ICD-10-CM | POA: Diagnosis not present

## 2020-04-21 DIAGNOSIS — I10 Essential (primary) hypertension: Secondary | ICD-10-CM | POA: Insufficient documentation

## 2020-04-21 DIAGNOSIS — Z79899 Other long term (current) drug therapy: Secondary | ICD-10-CM | POA: Insufficient documentation

## 2020-04-21 DIAGNOSIS — G9389 Other specified disorders of brain: Secondary | ICD-10-CM | POA: Diagnosis not present

## 2020-04-21 NOTE — ED Triage Notes (Signed)
Patient states he was having mid lower back yesterday. Patient denied having any pain in the right leg at this time. Today, after lying in bed this afternoon, patien tstates he got up and could not move the moves of his right foot and states that he is unable to move his right leg. Patient denies having numbness to the right leg and/or foot.

## 2020-04-22 ENCOUNTER — Observation Stay (HOSPITAL_COMMUNITY): Payer: BC Managed Care – PPO

## 2020-04-22 ENCOUNTER — Encounter (HOSPITAL_COMMUNITY): Payer: Self-pay | Admitting: Internal Medicine

## 2020-04-22 ENCOUNTER — Emergency Department (HOSPITAL_COMMUNITY): Payer: BC Managed Care – PPO

## 2020-04-22 ENCOUNTER — Observation Stay (HOSPITAL_COMMUNITY)
Admission: EM | Admit: 2020-04-22 | Discharge: 2020-04-23 | Disposition: A | Discharge status: Home / Self Care | Payer: BC Managed Care – PPO | Attending: Internal Medicine | Admitting: Internal Medicine

## 2020-04-22 DIAGNOSIS — M5136 Other intervertebral disc degeneration, lumbar region: Secondary | ICD-10-CM | POA: Diagnosis not present

## 2020-04-22 DIAGNOSIS — Z789 Other specified health status: Secondary | ICD-10-CM | POA: Insufficient documentation

## 2020-04-22 DIAGNOSIS — M4807 Spinal stenosis, lumbosacral region: Secondary | ICD-10-CM | POA: Diagnosis not present

## 2020-04-22 DIAGNOSIS — R29898 Other symptoms and signs involving the musculoskeletal system: Secondary | ICD-10-CM

## 2020-04-22 DIAGNOSIS — Z20822 Contact with and (suspected) exposure to covid-19: Secondary | ICD-10-CM | POA: Diagnosis not present

## 2020-04-22 DIAGNOSIS — C649 Malignant neoplasm of unspecified kidney, except renal pelvis: Secondary | ICD-10-CM | POA: Diagnosis not present

## 2020-04-22 DIAGNOSIS — G9389 Other specified disorders of brain: Secondary | ICD-10-CM | POA: Diagnosis not present

## 2020-04-22 DIAGNOSIS — M5126 Other intervertebral disc displacement, lumbar region: Secondary | ICD-10-CM | POA: Diagnosis not present

## 2020-04-22 DIAGNOSIS — Z79899 Other long term (current) drug therapy: Secondary | ICD-10-CM | POA: Diagnosis not present

## 2020-04-22 DIAGNOSIS — Z85528 Personal history of other malignant neoplasm of kidney: Secondary | ICD-10-CM | POA: Diagnosis not present

## 2020-04-22 DIAGNOSIS — C7931 Secondary malignant neoplasm of brain: Secondary | ICD-10-CM | POA: Diagnosis not present

## 2020-04-22 DIAGNOSIS — Z7189 Other specified counseling: Secondary | ICD-10-CM | POA: Diagnosis not present

## 2020-04-22 DIAGNOSIS — I619 Nontraumatic intracerebral hemorrhage, unspecified: Secondary | ICD-10-CM | POA: Diagnosis not present

## 2020-04-22 DIAGNOSIS — I10 Essential (primary) hypertension: Secondary | ICD-10-CM | POA: Diagnosis not present

## 2020-04-22 DIAGNOSIS — R531 Weakness: Secondary | ICD-10-CM | POA: Diagnosis not present

## 2020-04-22 DIAGNOSIS — M5127 Other intervertebral disc displacement, lumbosacral region: Secondary | ICD-10-CM | POA: Diagnosis not present

## 2020-04-22 DIAGNOSIS — Z515 Encounter for palliative care: Secondary | ICD-10-CM | POA: Diagnosis not present

## 2020-04-22 HISTORY — DX: Chronic kidney disease, unspecified: N18.9

## 2020-04-22 HISTORY — DX: Anxiety disorder, unspecified: F41.9

## 2020-04-22 HISTORY — DX: Malignant (primary) neoplasm, unspecified: C80.1

## 2020-04-22 LAB — CBC WITH DIFFERENTIAL/PLATELET
Abs Immature Granulocytes: 0.04 10*3/uL (ref 0.00–0.07)
Basophils Absolute: 0 10*3/uL (ref 0.0–0.1)
Basophils Relative: 0 %
Eosinophils Absolute: 0.1 10*3/uL (ref 0.0–0.5)
Eosinophils Relative: 1 %
HCT: 26.8 % — ABNORMAL LOW (ref 39.0–52.0)
Hemoglobin: 8.5 g/dL — ABNORMAL LOW (ref 13.0–17.0)
Immature Granulocytes: 0 %
Lymphocytes Relative: 13 %
Lymphs Abs: 1.4 10*3/uL (ref 0.7–4.0)
MCH: 25 pg — ABNORMAL LOW (ref 26.0–34.0)
MCHC: 31.7 g/dL (ref 30.0–36.0)
MCV: 78.8 fL — ABNORMAL LOW (ref 80.0–100.0)
Monocytes Absolute: 0.8 10*3/uL (ref 0.1–1.0)
Monocytes Relative: 8 %
Neutro Abs: 8.1 10*3/uL — ABNORMAL HIGH (ref 1.7–7.7)
Neutrophils Relative %: 78 %
Platelets: 255 10*3/uL (ref 150–400)
RBC: 3.4 MIL/uL — ABNORMAL LOW (ref 4.22–5.81)
RDW: 14.5 % (ref 11.5–15.5)
WBC: 10.5 10*3/uL (ref 4.0–10.5)
nRBC: 0 % (ref 0.0–0.2)

## 2020-04-22 LAB — BASIC METABOLIC PANEL
Anion gap: 11 (ref 5–15)
BUN: 26 mg/dL — ABNORMAL HIGH (ref 6–20)
CO2: 21 mmol/L — ABNORMAL LOW (ref 22–32)
Calcium: 9.1 mg/dL (ref 8.9–10.3)
Chloride: 104 mmol/L (ref 98–111)
Creatinine, Ser: 1.75 mg/dL — ABNORMAL HIGH (ref 0.61–1.24)
GFR, Estimated: 47 mL/min — ABNORMAL LOW (ref >60)
Glucose, Bld: 124 mg/dL — ABNORMAL HIGH (ref 70–99)
Potassium: 4.2 mmol/L (ref 3.5–5.1)
Sodium: 136 mmol/L (ref 135–145)

## 2020-04-22 LAB — RESPIRATORY PANEL BY RT PCR (FLU A&B, COVID)
Influenza A by PCR: NEGATIVE
Influenza B by PCR: NEGATIVE
SARS Coronavirus 2 by RT PCR: NEGATIVE

## 2020-04-22 LAB — HIV ANTIBODY (ROUTINE TESTING W REFLEX): HIV Screen 4th Generation wRfx: NONREACTIVE

## 2020-04-22 IMAGING — MR MR THORACIC SPINE WO/W CM
4 of 8 series · 17 of 48 positions shown · IV contrast (gadavist)
Comparison: Prior brain MRI from earlier the same day.

CLINICAL DATA: Initial evaluation for right lower extremity
weakness, history of metastatic renal cell carcinoma.

EXAM:
MRI CERVICAL, THORACIC AND LUMBAR SPINE WITHOUT AND WITH CONTRAST
TECHNIQUE: Multiplanar and multiecho pulse sequences of the cervical spine, to
include the craniocervical junction and cervicothoracic junction,
and thoracic and lumbar spine, were obtained without and with
intravenous contrast.
CONTRAST:  10mL GADAVIST GADOBUTROL 1 MMOL/ML IV SOLN

[Series 14: T2 · sagittal · 3.0mm · 0.66mm/px · 5 of 22 slices shown (1 of 2)]
[im 1/22]
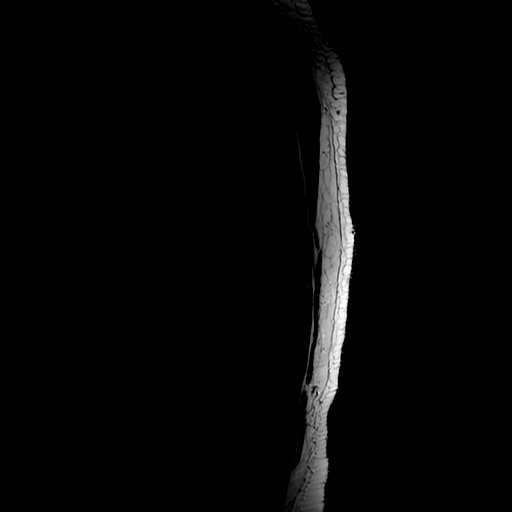
[im 6/22]
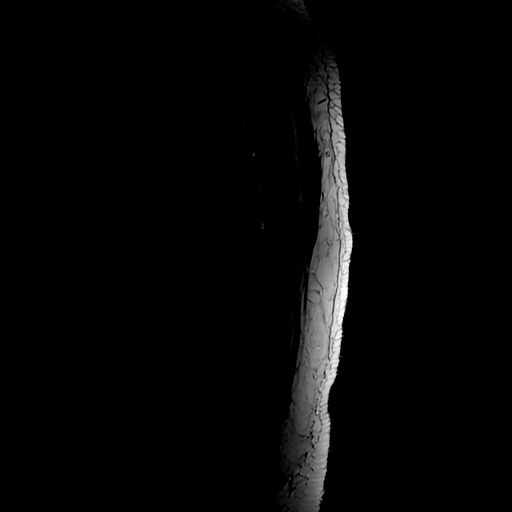
[im 11/22]
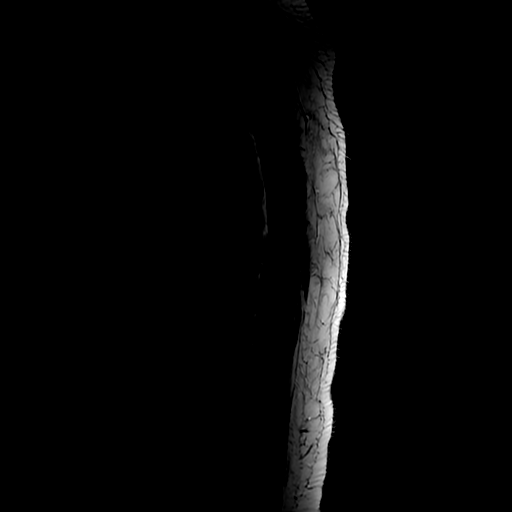
[im 16/22]
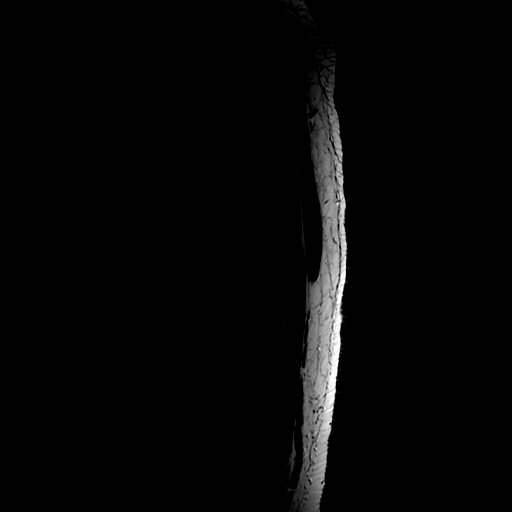
[im 22/22]
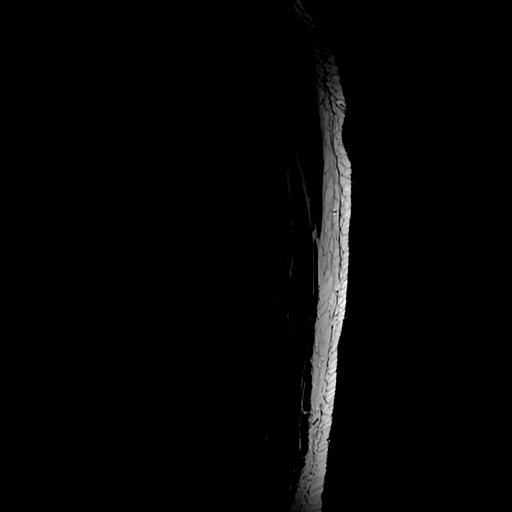

[Series 16: T1 · sagittal · 3.0mm · 0.66mm/px · 3 of 22 slices shown (1 of 2)]
[im 1/22]
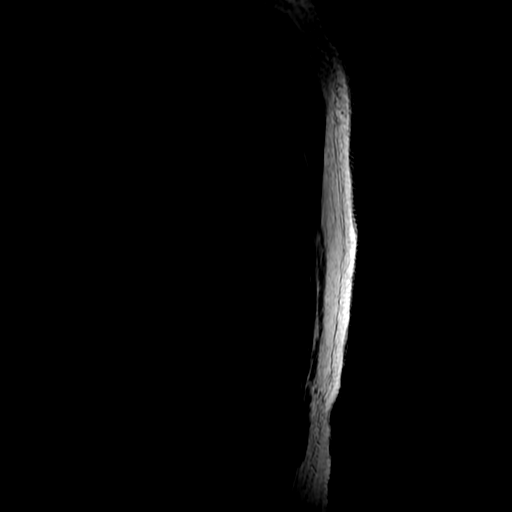
[im 11/22]
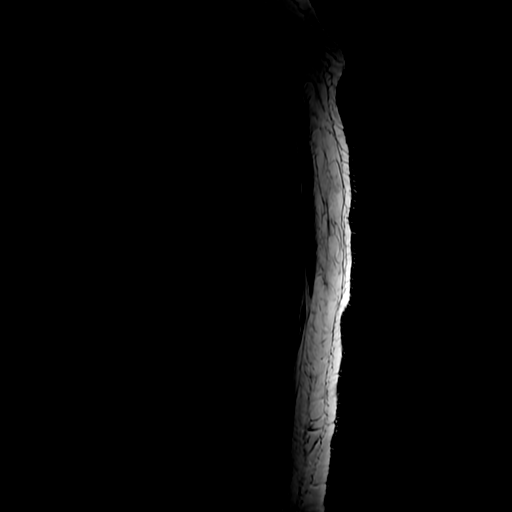
[im 22/22]
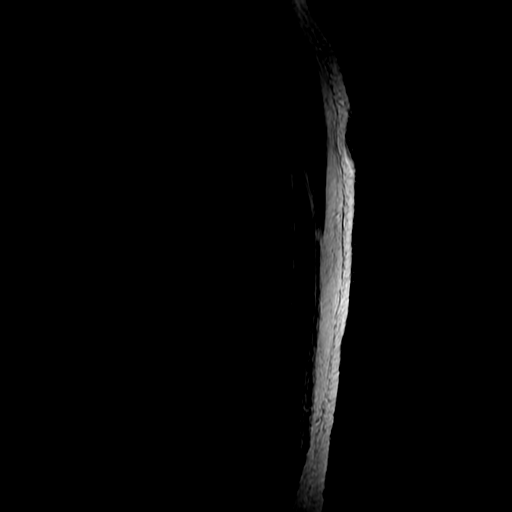

[Series 17: T2 · axial · 4.0mm · 0.39mm/px · z∈[-359,-177]mm · 6 of 36 slices shown (2 of 2)]
[im 1/36]
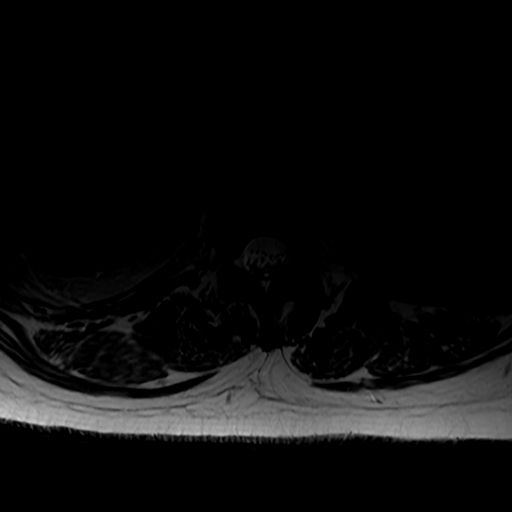
[im 6/36]
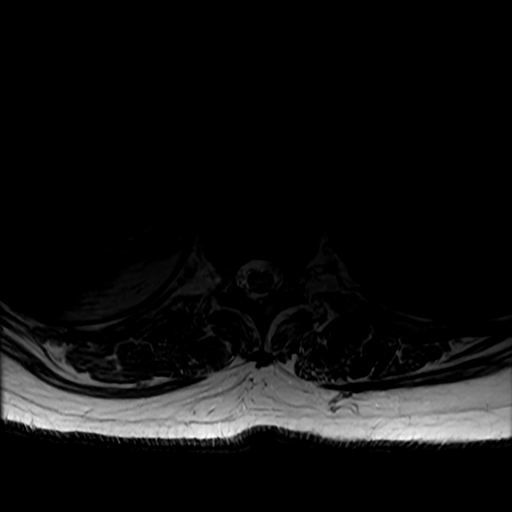
[im 12/36]
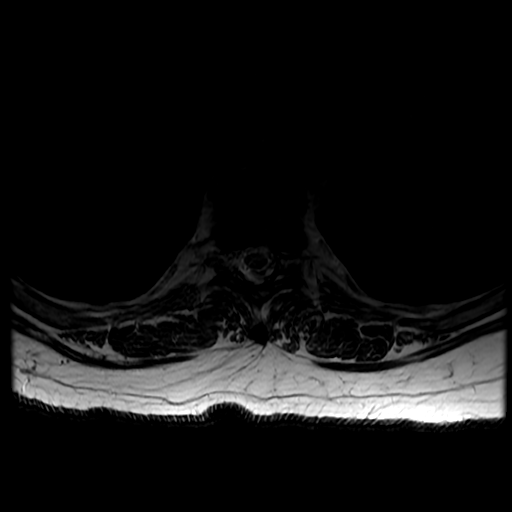
[im 18/36]
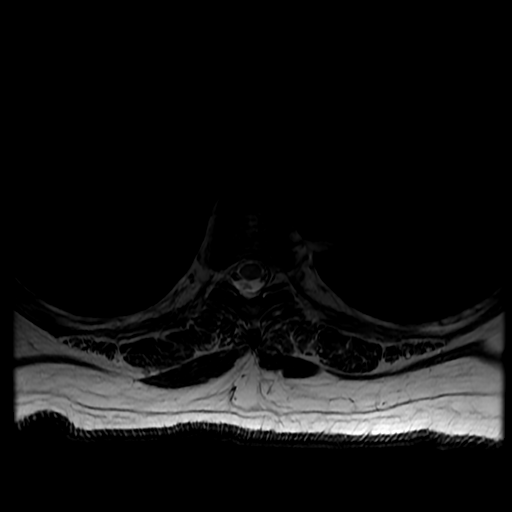
[im 24/36]
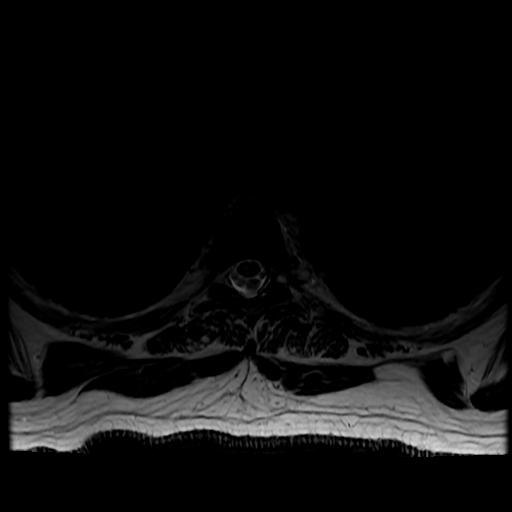
[im 30/36]
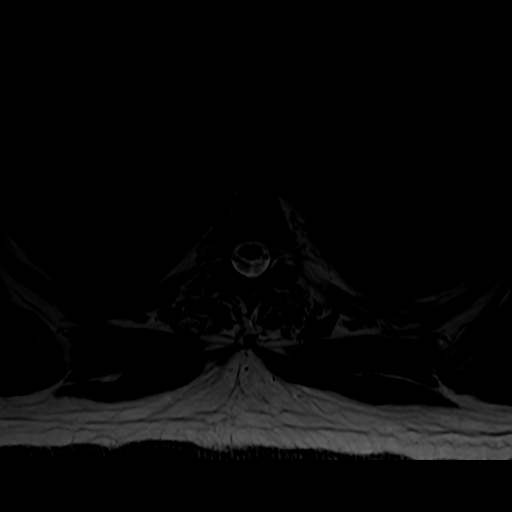

[Series 19: T1 · axial · non-contrast · 4.0mm · 0.39mm/px · z∈[-322,-177]mm · 3 of 36 slices shown (2 of 2)]
[im 6/36]
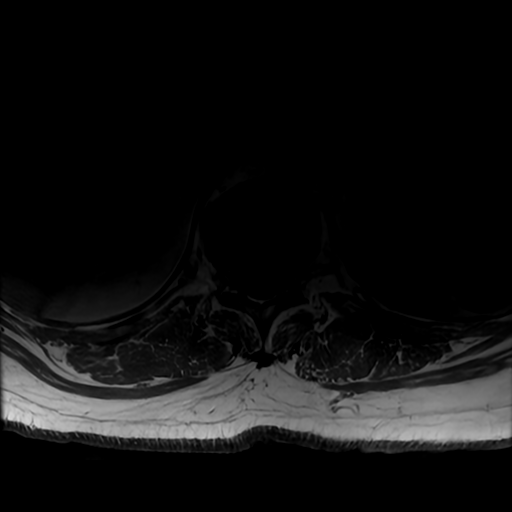
[im 18/36]
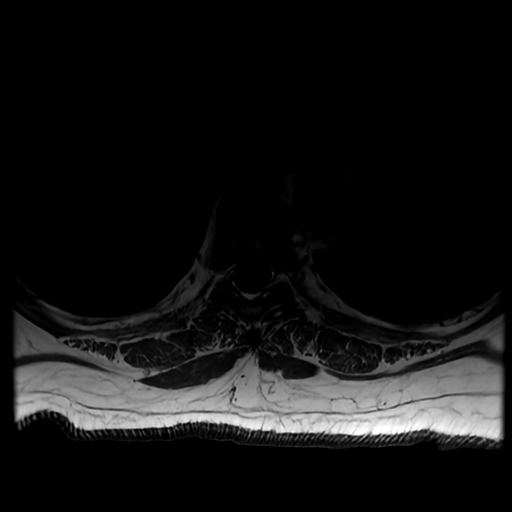
[im 30/36]
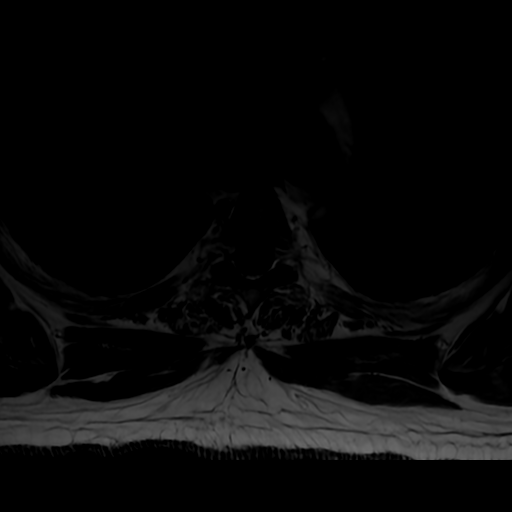

[17 of 48 positions shown; findings below may reference images not displayed]

FINDINGS: MRI CERVICAL SPINE FINDINGS

Alignment: Examination degraded by motion artifact.

Straightening of the normal cervical lordosis.  No listhesis.

Vertebrae: Vertebral body height maintained without acute or chronic
fracture. Bone marrow signal intensity normal. No focal marrow
replacing lesion. No abnormal marrow edema or enhancement.

Cord: Normal signal and morphology. No abnormal enhancement. No
epidural mass or tumor.

Posterior Fossa, vertebral arteries, paraspinal tissues: Multiple
hemorrhagic brain metastases partially visualize, better evaluated
on prior brain MRI. Craniocervical junction normal. Paraspinous and
prevertebral soft tissues within normal limits. Normal flow voids
seen within the vertebral arteries bilaterally.

Disc levels:

No significant disc pathology seen within the cervical spine. No
significant spinal stenosis. Foramina remain patent.

MRI THORACIC SPINE FINDINGS

Alignment:  Examination degraded by motion artifact.

Vertebral bodies normally aligned with preservation of the normal
thoracic kyphosis. No listhesis.

Vertebrae: Vertebral body height maintained without acute or chronic
fracture. Bone marrow signal intensity within normal limits. No
discrete or worrisome osseous lesions. No abnormal marrow edema or
enhancement.

Cord: Normal signal and morphology. No abnormal enhancement. No
epidural tumor.

Paraspinal and other soft tissues: Negative. Visceral structures
better evaluated on prior cross-sectional imaging.

Disc levels:

No significant disc pathology seen within the thoracic spine. No
disc bulge or focal disc herniation. No stenosis or neural
impingement.

MRI LUMBAR SPINE FINDINGS

Segmentation:  Examination degraded by motion artifact.

Standard segmentation. Lowest well-formed disc space labeled the
L5-S1 level.

Alignment: Physiologic with preservation of the normal lumbar
lordosis. No listhesis.

Vertebrae: Vertebral body height maintained without acute or chronic
fracture. Bone marrow signal intensity within normal limits. No
discrete or worrisome osseous lesions. No abnormal marrow edema or
enhancement.

Conus medullaris and cauda equina: Conus extends to the T12-L1
level. Conus and cauda equina appear normal. No abnormal
enhancement. No epidural tumor.

Paraspinal and other soft tissues: Paraspinous soft tissues within
normal limits. Left kidney appears to be absent.

Disc levels:

L4-5: Diffuse disc bulge with disc desiccation. Superimposed shallow
central disc protrusion with annular fissure. No spinal stenosis.
Foramina remain patent.

L5-S1: Diffuse disc bulge with disc desiccation. Mild reactive
endplate change. Superimposed small central disc protrusion indents
the ventral thecal sac. No significant spinal stenosis. Mild
bilateral L5 foraminal narrowing. No impingement.
IMPRESSION: 1. Normal MRI appearance of the cervical, thoracic, and lumbar
spine. No evidence for metastatic disease. No other findings to
explain patient's symptoms identified.
2. Mild lower lumbar degenerative disc disease without significant
stenosis or neural impingement.

## 2020-04-22 IMAGING — MR MR LUMBAR SPINE WO/W CM
4 of 7 series · 17 of 48 positions shown · IV contrast (10 gv)
Comparison: Prior brain MRI from earlier the same day.

CLINICAL DATA: Initial evaluation for right lower extremity
weakness, history of metastatic renal cell carcinoma.

EXAM:
MRI CERVICAL, THORACIC AND LUMBAR SPINE WITHOUT AND WITH CONTRAST
TECHNIQUE: Multiplanar and multiecho pulse sequences of the cervical spine, to
include the craniocervical junction and cervicothoracic junction,
and thoracic and lumbar spine, were obtained without and with
intravenous contrast.
CONTRAST:  10mL GADAVIST GADOBUTROL 1 MMOL/ML IV SOLN

[Series 22: T2 · sagittal · 4.0mm · 0.55mm/px · 4 of 15 slices shown (1 of 2)]
[im 1/15]
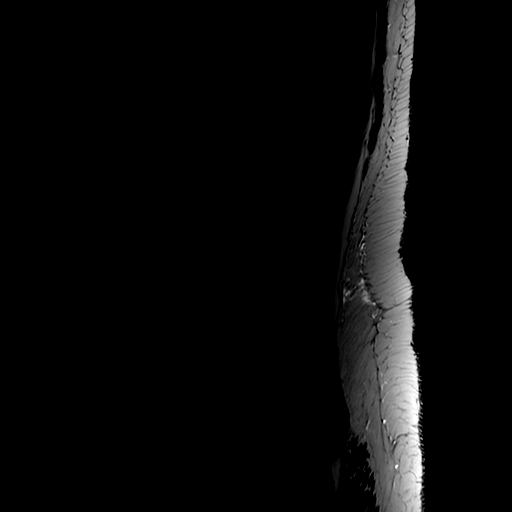
[im 5/15]
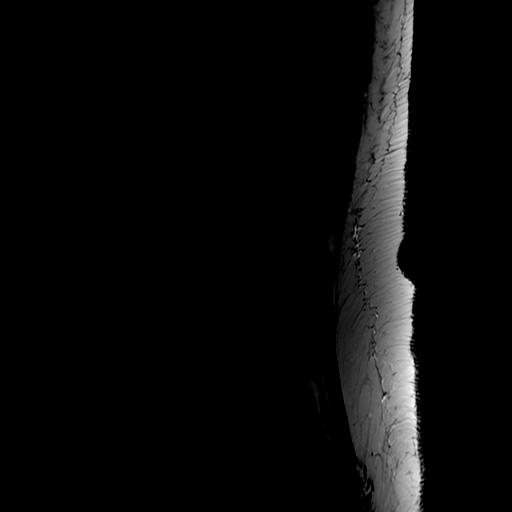
[im 10/15]
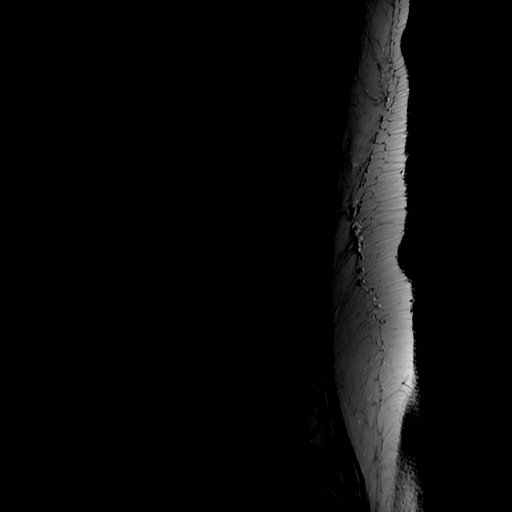
[im 15/15]
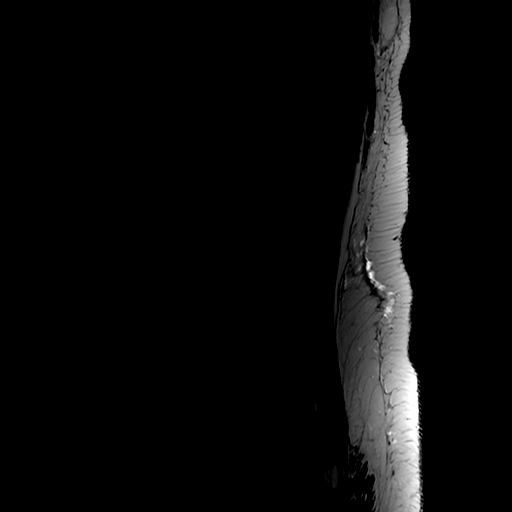

[Series 24: T1 · sagittal · 4.0mm · 0.55mm/px · 3 of 15 slices shown (1 of 2)]
[im 1/15]
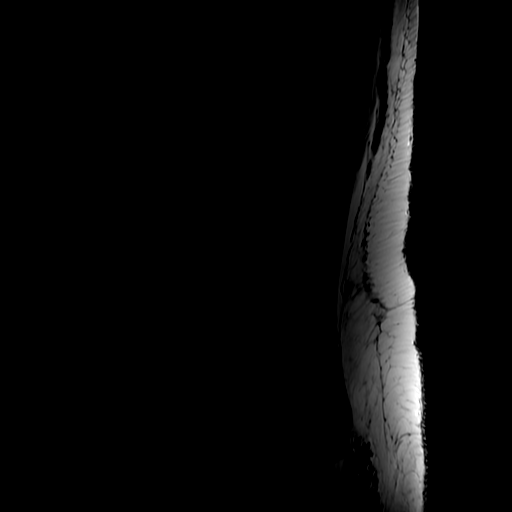
[im 8/15]
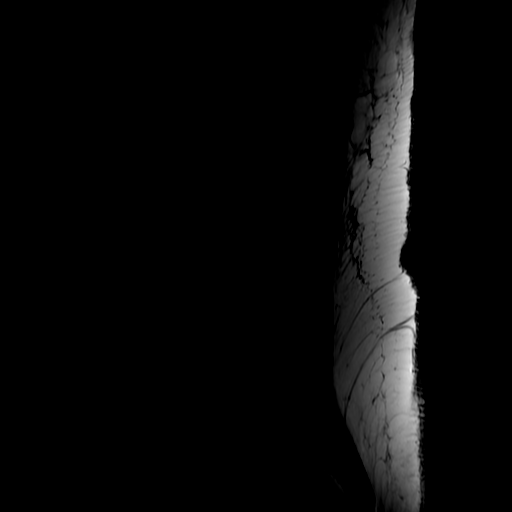
[im 15/15]
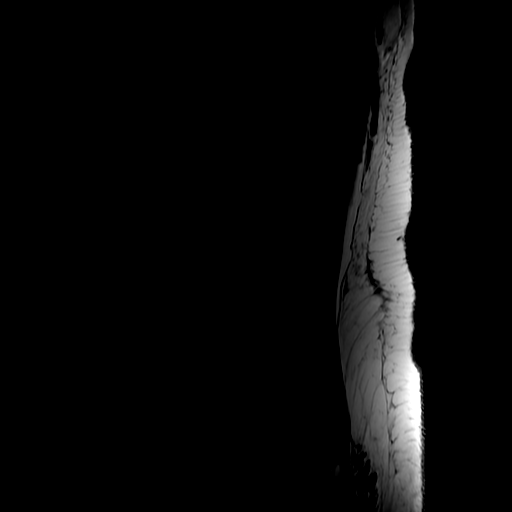

[Series 25: T2 · axial · 4.0mm · 0.39mm/px · z∈[-560,-400]mm · 7 of 33 slices shown (2 of 2)]
[im 1/33]
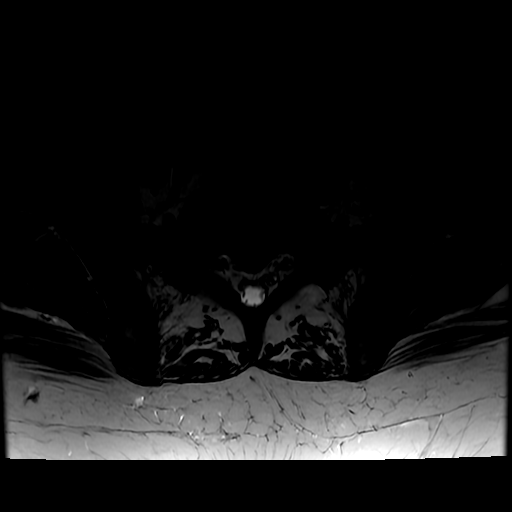
[im 4/33]
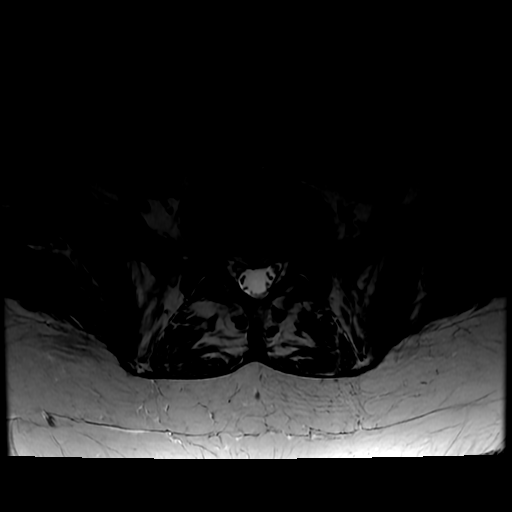
[im 11/33]
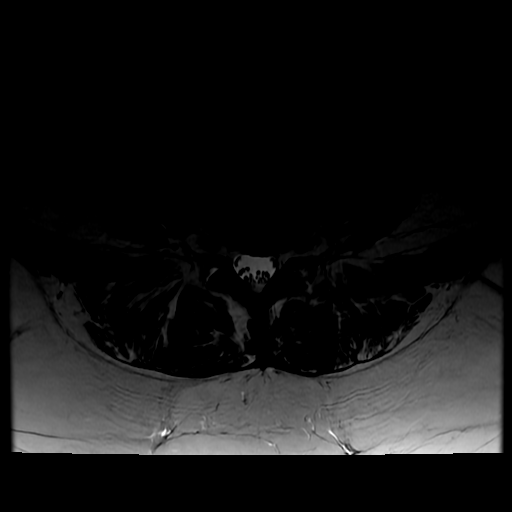
[im 15/33]
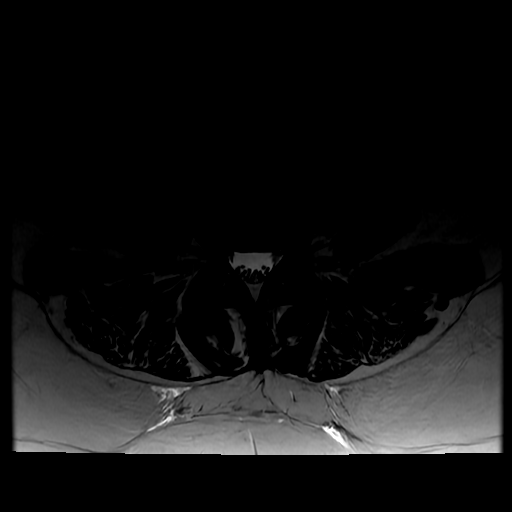
[im 18/33]
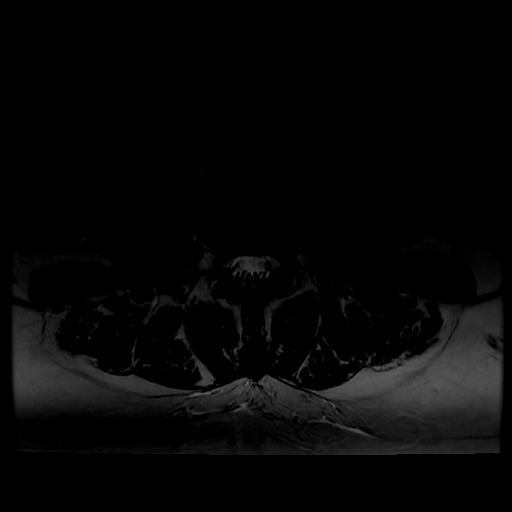
[im 22/33]
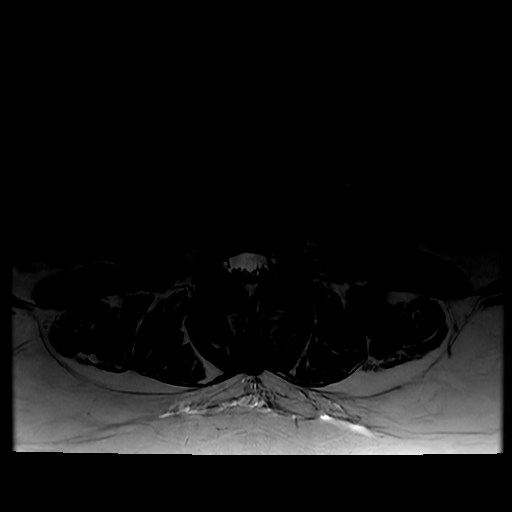
[im 29/33]
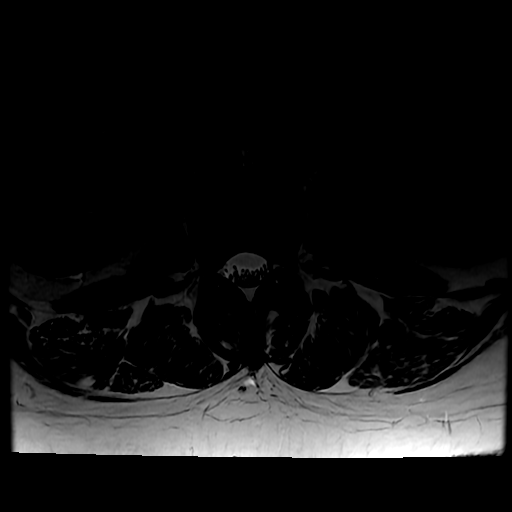

[Series 26: T1 · axial · 4.0mm · 0.39mm/px · z∈[-545,-400]mm · 3 of 33 slices shown (2 of 2)]
[im 4/33]
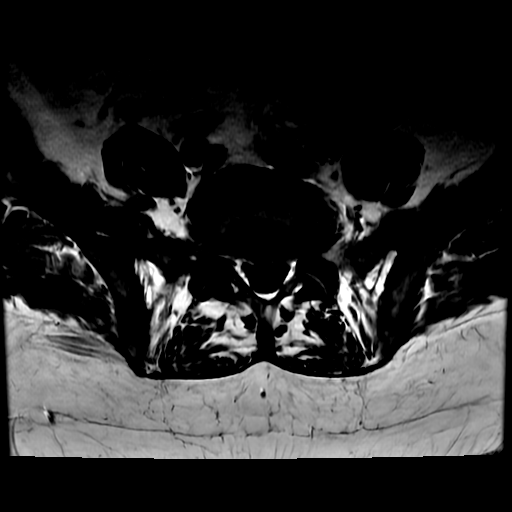
[im 18/33]
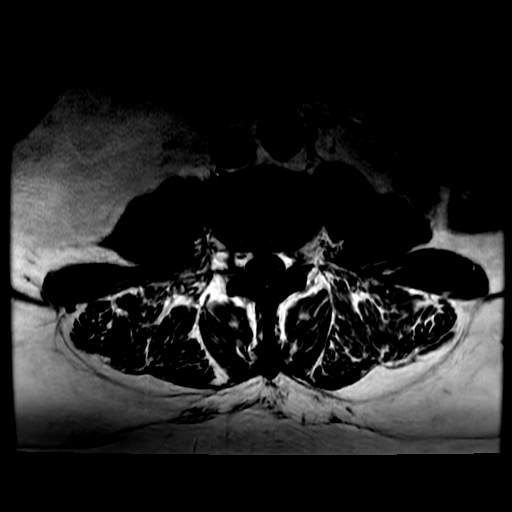
[im 29/33]
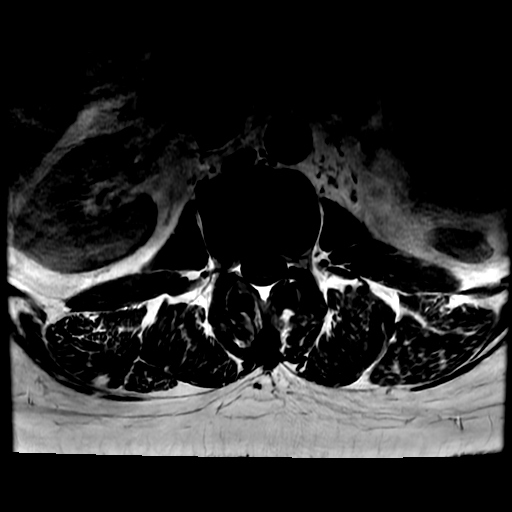

[17 of 48 positions shown; findings below may reference images not displayed]

FINDINGS: MRI CERVICAL SPINE FINDINGS

Alignment: Examination degraded by motion artifact.

Straightening of the normal cervical lordosis.  No listhesis.

Vertebrae: Vertebral body height maintained without acute or chronic
fracture. Bone marrow signal intensity normal. No focal marrow
replacing lesion. No abnormal marrow edema or enhancement.

Cord: Normal signal and morphology. No abnormal enhancement. No
epidural mass or tumor.

Posterior Fossa, vertebral arteries, paraspinal tissues: Multiple
hemorrhagic brain metastases partially visualize, better evaluated
on prior brain MRI. Craniocervical junction normal. Paraspinous and
prevertebral soft tissues within normal limits. Normal flow voids
seen within the vertebral arteries bilaterally.

Disc levels:

No significant disc pathology seen within the cervical spine. No
significant spinal stenosis. Foramina remain patent.

MRI THORACIC SPINE FINDINGS

Alignment:  Examination degraded by motion artifact.

Vertebral bodies normally aligned with preservation of the normal
thoracic kyphosis. No listhesis.

Vertebrae: Vertebral body height maintained without acute or chronic
fracture. Bone marrow signal intensity within normal limits. No
discrete or worrisome osseous lesions. No abnormal marrow edema or
enhancement.

Cord: Normal signal and morphology. No abnormal enhancement. No
epidural tumor.

Paraspinal and other soft tissues: Negative. Visceral structures
better evaluated on prior cross-sectional imaging.

Disc levels:

No significant disc pathology seen within the thoracic spine. No
disc bulge or focal disc herniation. No stenosis or neural
impingement.

MRI LUMBAR SPINE FINDINGS

Segmentation:  Examination degraded by motion artifact.

Standard segmentation. Lowest well-formed disc space labeled the
L5-S1 level.

Alignment: Physiologic with preservation of the normal lumbar
lordosis. No listhesis.

Vertebrae: Vertebral body height maintained without acute or chronic
fracture. Bone marrow signal intensity within normal limits. No
discrete or worrisome osseous lesions. No abnormal marrow edema or
enhancement.

Conus medullaris and cauda equina: Conus extends to the T12-L1
level. Conus and cauda equina appear normal. No abnormal
enhancement. No epidural tumor.

Paraspinal and other soft tissues: Paraspinous soft tissues within
normal limits. Left kidney appears to be absent.

Disc levels:

L4-5: Diffuse disc bulge with disc desiccation. Superimposed shallow
central disc protrusion with annular fissure. No spinal stenosis.
Foramina remain patent.

L5-S1: Diffuse disc bulge with disc desiccation. Mild reactive
endplate change. Superimposed small central disc protrusion indents
the ventral thecal sac. No significant spinal stenosis. Mild
bilateral L5 foraminal narrowing. No impingement.
IMPRESSION: 1. Normal MRI appearance of the cervical, thoracic, and lumbar
spine. No evidence for metastatic disease. No other findings to
explain patient's symptoms identified.
2. Mild lower lumbar degenerative disc disease without significant
stenosis or neural impingement.

## 2020-04-22 IMAGING — MR MR CERVICAL SPINE WO/W CM
4 of 9 series · 17 of 48 positions shown · IV contrast (gadavist)
Comparison: Prior brain MRI from earlier the same day.

CLINICAL DATA: Initial evaluation for right lower extremity
weakness, history of metastatic renal cell carcinoma.

EXAM:
MRI CERVICAL, THORACIC AND LUMBAR SPINE WITHOUT AND WITH CONTRAST
TECHNIQUE: Multiplanar and multiecho pulse sequences of the cervical spine, to
include the craniocervical junction and cervicothoracic junction,
and thoracic and lumbar spine, were obtained without and with
intravenous contrast.
CONTRAST:  10mL GADAVIST GADOBUTROL 1 MMOL/ML IV SOLN

[Series 3: T2 · sagittal · 3.0mm · 0.43mm/px · 4 of 16 slices shown (1 of 2)]
[im 1/16]
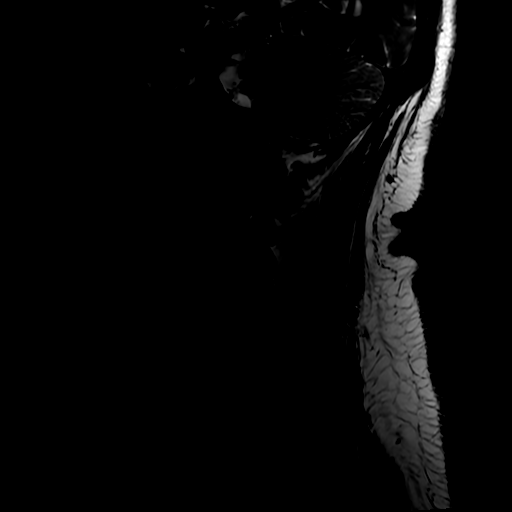
[im 6/16]
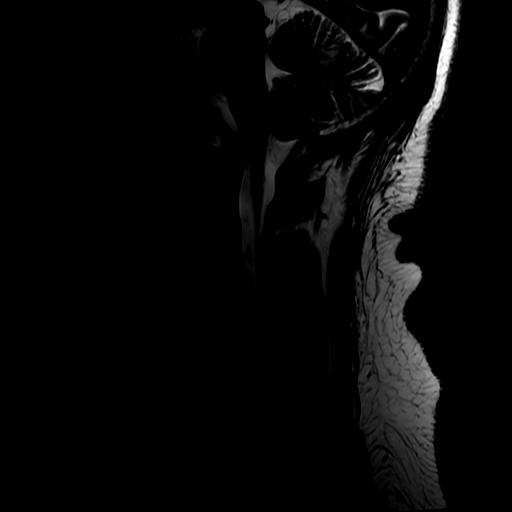
[im 11/16]
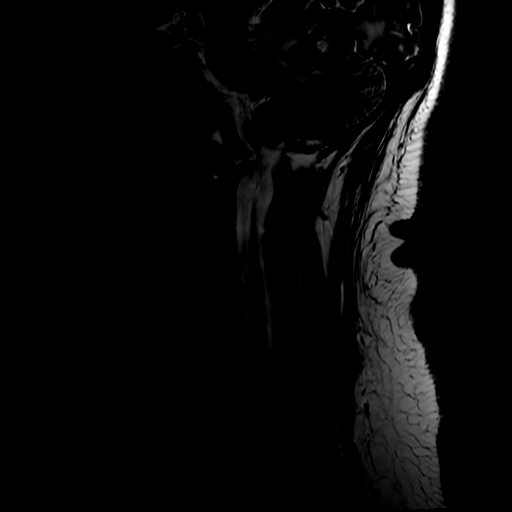
[im 16/16]
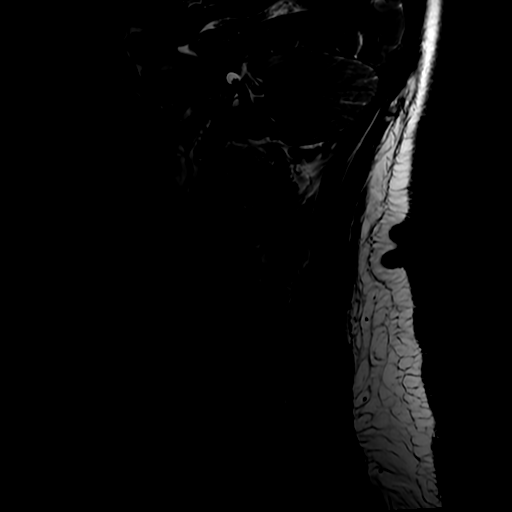

[Series 7: T2 · axial · 3.0mm · 0.35mm/px · z∈[-120,-30]mm · 6 of 29 slices shown (2 of 2)]
[im 1/29]
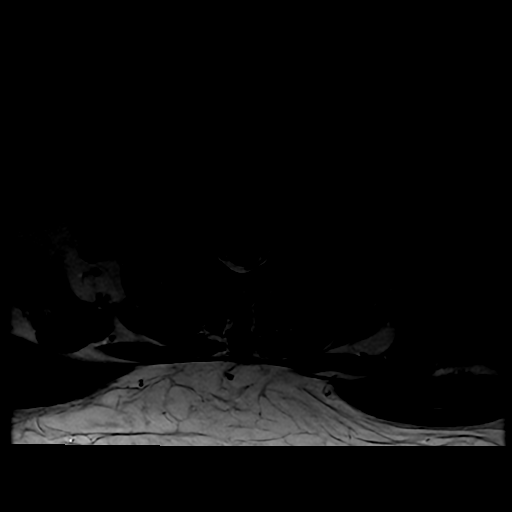
[im 6/29]
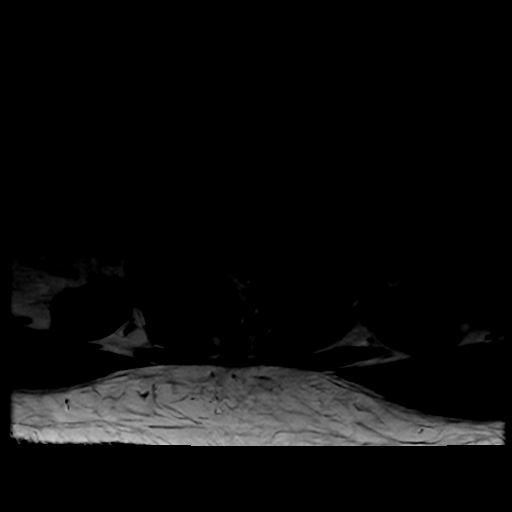
[im 12/29]
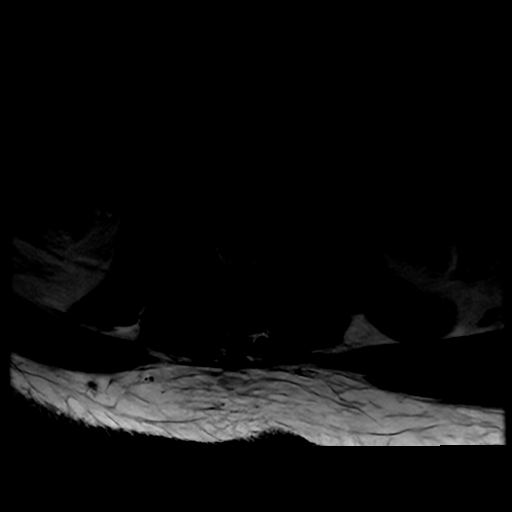
[im 17/29]
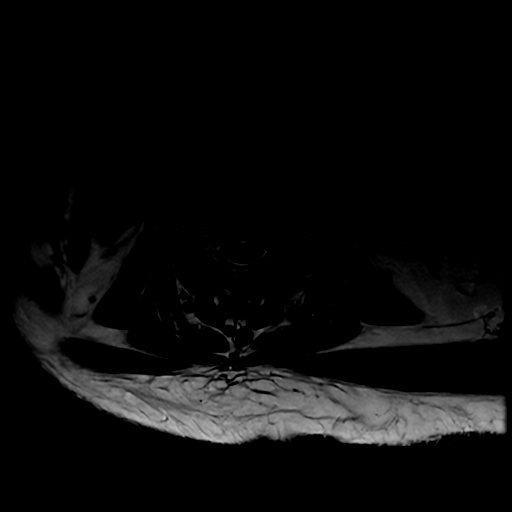
[im 23/29]
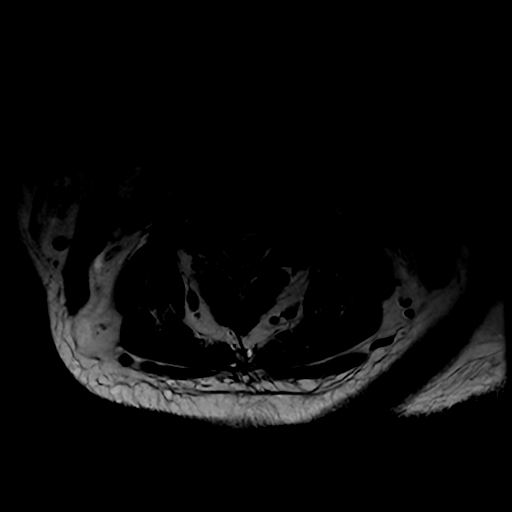
[im 29/29]
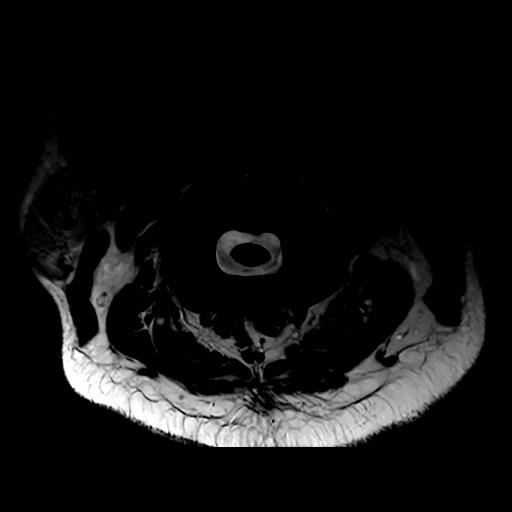

[Series 8: T1 · axial · non-contrast · 3.0mm · 0.35mm/px · z∈[-120,-30]mm · 5 of 28 slices shown (1 of 2)]
[im 1/28]
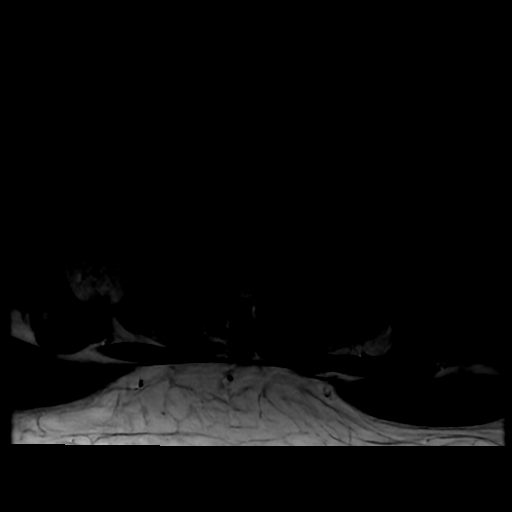
[im 7/28]
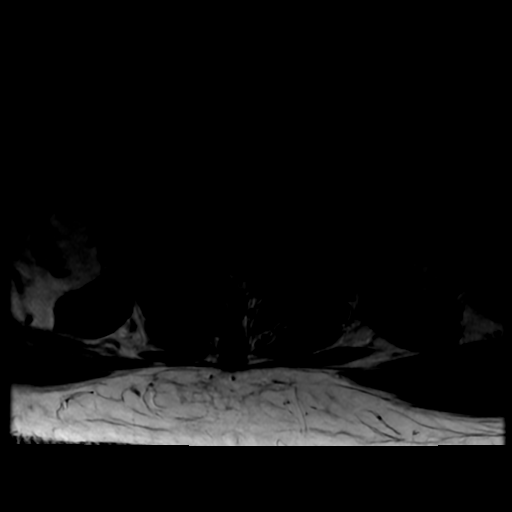
[im 14/28]
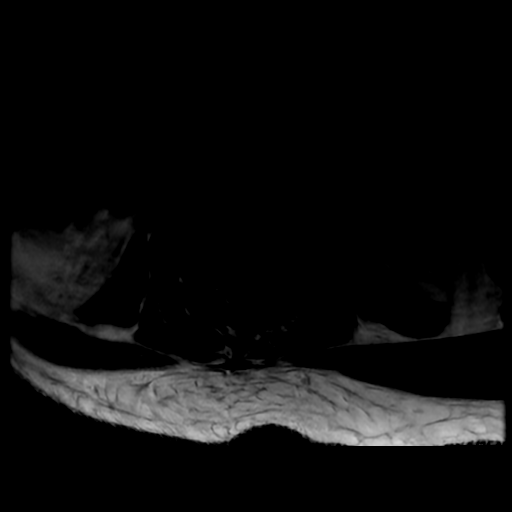
[im 21/28]
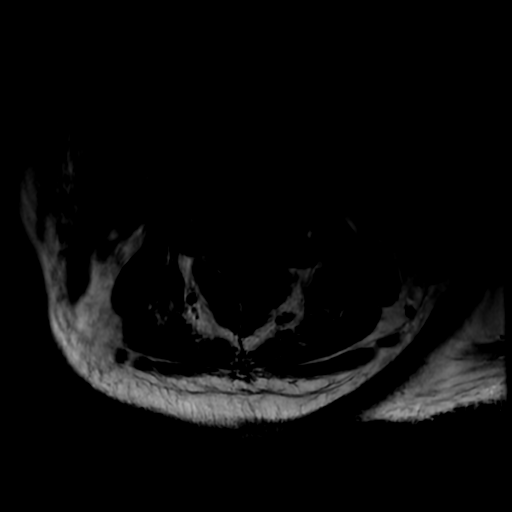
[im 28/28]
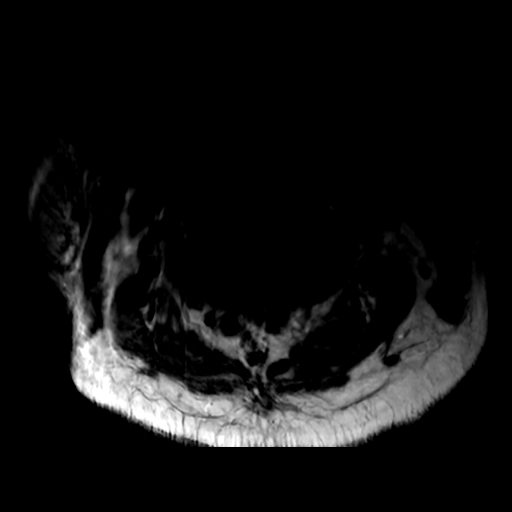

[Series 11: T1 · sagittal · 3.0mm · 0.90mm/px · 2 of 12 slices shown (2 of 2)]
[im 1/12]
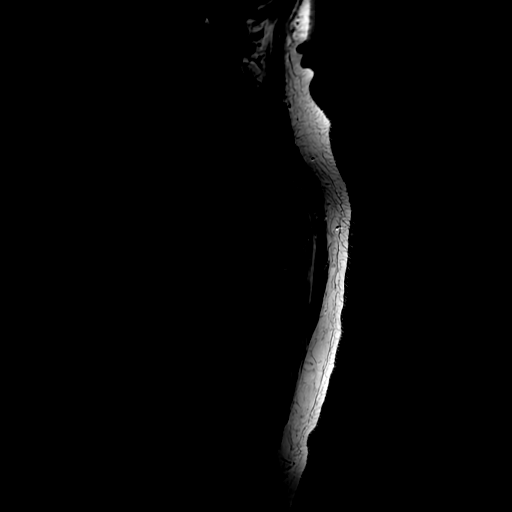
[im 12/12]
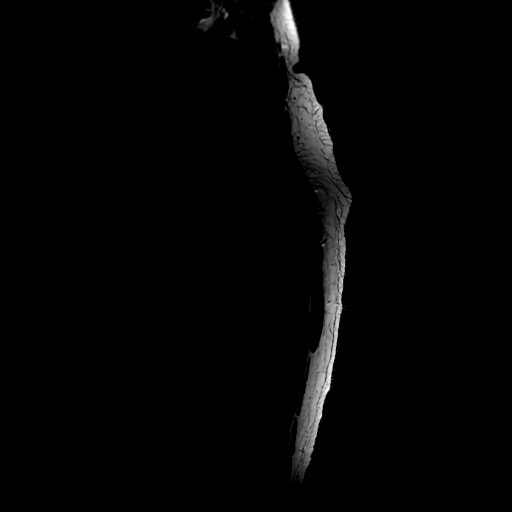

[17 of 48 positions shown; findings below may reference images not displayed]

FINDINGS: MRI CERVICAL SPINE FINDINGS

Alignment: Examination degraded by motion artifact.

Straightening of the normal cervical lordosis.  No listhesis.

Vertebrae: Vertebral body height maintained without acute or chronic
fracture. Bone marrow signal intensity normal. No focal marrow
replacing lesion. No abnormal marrow edema or enhancement.

Cord: Normal signal and morphology. No abnormal enhancement. No
epidural mass or tumor.

Posterior Fossa, vertebral arteries, paraspinal tissues: Multiple
hemorrhagic brain metastases partially visualize, better evaluated
on prior brain MRI. Craniocervical junction normal. Paraspinous and
prevertebral soft tissues within normal limits. Normal flow voids
seen within the vertebral arteries bilaterally.

Disc levels:

No significant disc pathology seen within the cervical spine. No
significant spinal stenosis. Foramina remain patent.

MRI THORACIC SPINE FINDINGS

Alignment:  Examination degraded by motion artifact.

Vertebral bodies normally aligned with preservation of the normal
thoracic kyphosis. No listhesis.

Vertebrae: Vertebral body height maintained without acute or chronic
fracture. Bone marrow signal intensity within normal limits. No
discrete or worrisome osseous lesions. No abnormal marrow edema or
enhancement.

Cord: Normal signal and morphology. No abnormal enhancement. No
epidural tumor.

Paraspinal and other soft tissues: Negative. Visceral structures
better evaluated on prior cross-sectional imaging.

Disc levels:

No significant disc pathology seen within the thoracic spine. No
disc bulge or focal disc herniation. No stenosis or neural
impingement.

MRI LUMBAR SPINE FINDINGS

Segmentation:  Examination degraded by motion artifact.

Standard segmentation. Lowest well-formed disc space labeled the
L5-S1 level.

Alignment: Physiologic with preservation of the normal lumbar
lordosis. No listhesis.

Vertebrae: Vertebral body height maintained without acute or chronic
fracture. Bone marrow signal intensity within normal limits. No
discrete or worrisome osseous lesions. No abnormal marrow edema or
enhancement.

Conus medullaris and cauda equina: Conus extends to the T12-L1
level. Conus and cauda equina appear normal. No abnormal
enhancement. No epidural tumor.

Paraspinal and other soft tissues: Paraspinous soft tissues within
normal limits. Left kidney appears to be absent.

Disc levels:

L4-5: Diffuse disc bulge with disc desiccation. Superimposed shallow
central disc protrusion with annular fissure. No spinal stenosis.
Foramina remain patent.

L5-S1: Diffuse disc bulge with disc desiccation. Mild reactive
endplate change. Superimposed small central disc protrusion indents
the ventral thecal sac. No significant spinal stenosis. Mild
bilateral L5 foraminal narrowing. No impingement.
IMPRESSION: 1. Normal MRI appearance of the cervical, thoracic, and lumbar
spine. No evidence for metastatic disease. No other findings to
explain patient's symptoms identified.
2. Mild lower lumbar degenerative disc disease without significant
stenosis or neural impingement.

## 2020-04-22 IMAGING — MR MR HEAD WO/W CM
12 of 14 series · 40 of 48 positions shown · IV contrast (gadavist)
Comparison: None.

CLINICAL DATA: Brain mass or lesion.  Right leg weakness.

EXAM:
MRI HEAD WITHOUT AND WITH CONTRAST
TECHNIQUE: Multiplanar, multiecho pulse sequences of the brain and surrounding
structures were obtained without and with intravenous contrast.
CONTRAST:  10mL GADAVIST GADOBUTROL 1 MMOL/ML IV SOLN

[Series 5: DWI · axial · 3.0mm · 0.88mm/px · z∈[-103,+50]mm · 9 of 104 slices shown (1 of 4)]
[im 1/104]
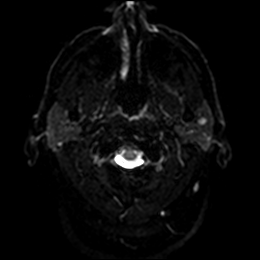
[im 13/104]
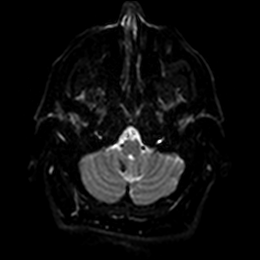
[im 26/104]
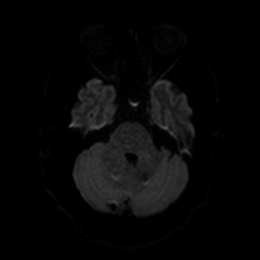
[im 39/104]
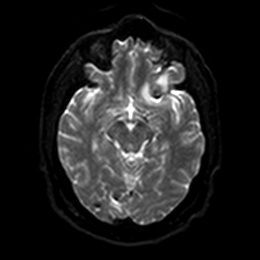
[im 52/104]
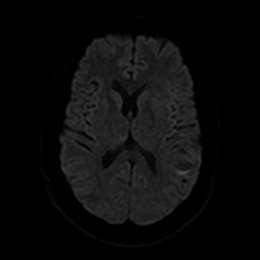
[im 65/104]
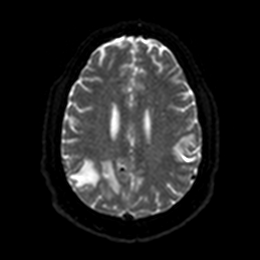
[im 78/104]
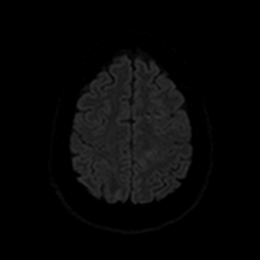
[im 91/104]
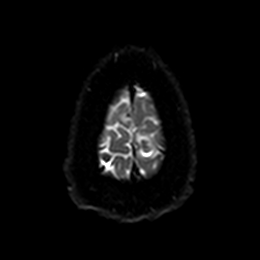
[im 104/104]
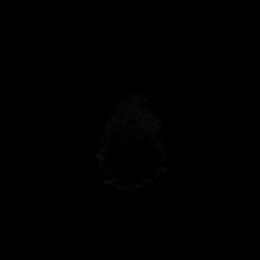

[Series 6: DWI · axial · 3.0mm · 0.88mm/px · z∈[-103,+50]mm · 4 of 52 slices shown (2 of 4)]
[im 1/52]
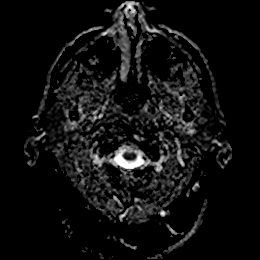
[im 18/52]
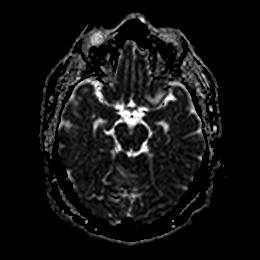
[im 35/52]
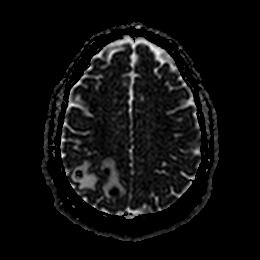
[im 52/52]
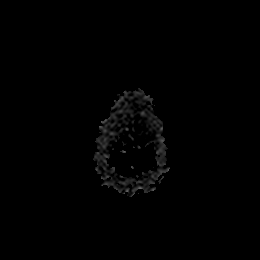

[Series 7: DWI · coronal · 4.0mm · 0.88mm/px · 5 of 66 slices shown (3 of 4)]
[im 1/66]
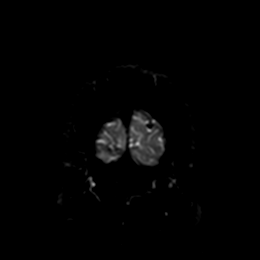
[im 17/66]
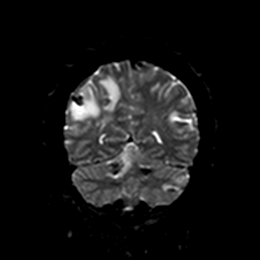
[im 33/66]
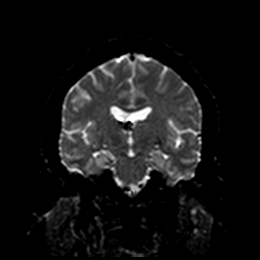
[im 49/66]
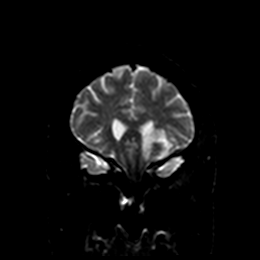
[im 66/66]
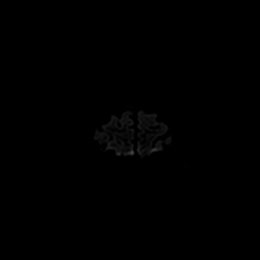

[Series 8: DWI · coronal · 4.0mm · 0.88mm/px · 2 of 33 slices shown (4 of 4)]
[im 1/33]
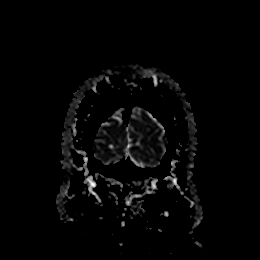
[im 33/33]
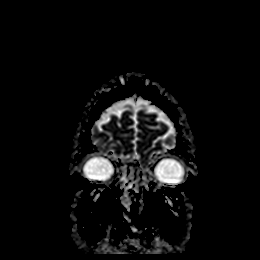

[Series 9: T1 · sagittal · 5.0mm · 0.78mm/px · 2 of 23 slices shown]
[im 1/23]
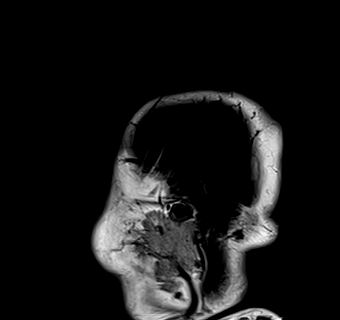
[im 23/23]
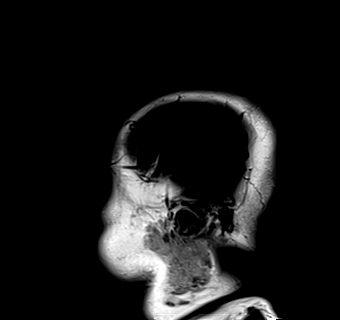

[Series 10: T2 · axial · 5.0mm · 0.72mm/px · z∈[-105,+51]mm · 2 of 27 slices shown]
[im 1/27]
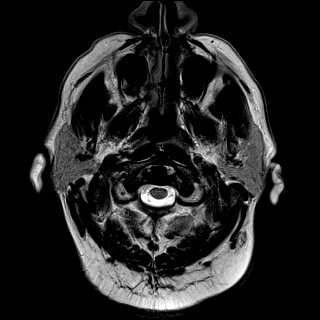
[im 27/27]
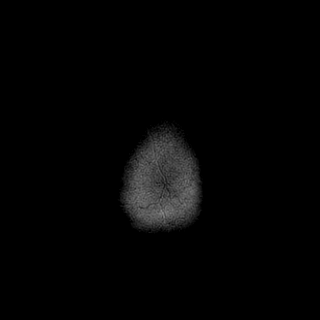

[Series 11: FLAIR · axial · 5.0mm · 0.45mm/px · z∈[-104,+52]mm · 2 of 27 slices shown]
[im 1/27]
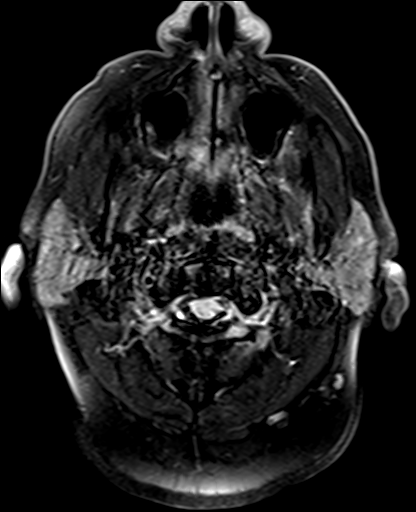
[im 27/27]
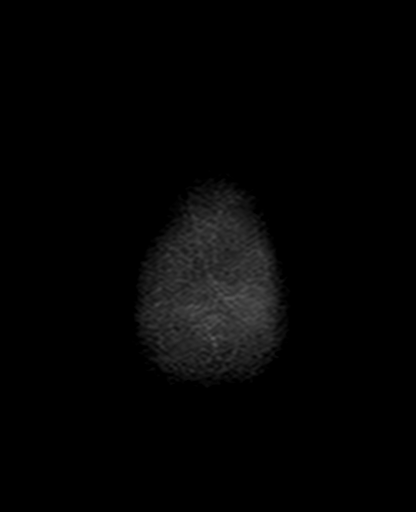

[Series 13: pha_images · axial · 3.0mm · 0.90mm/px · z∈[-114,+59]mm · 4 of 58 slices shown]
[im 1/58]
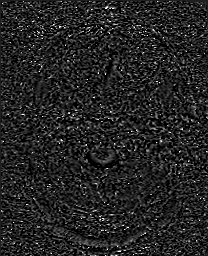
[im 20/58]
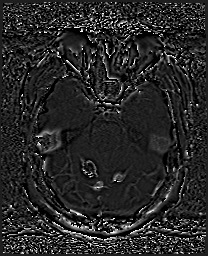
[im 39/58]
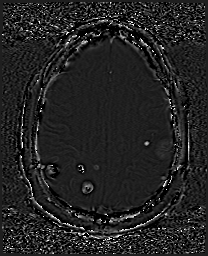
[im 58/58]
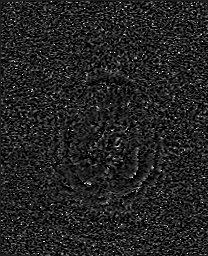

[Series 14: swi_images · axial · 3.0mm · 0.90mm/px · z∈[-114,+62]mm · 4 of 60 slices shown]
[im 1/60]
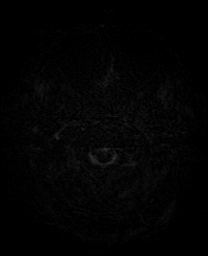
[im 20/60]
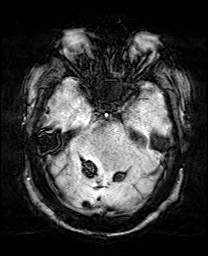
[im 40/60]
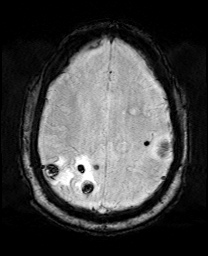
[im 60/60]
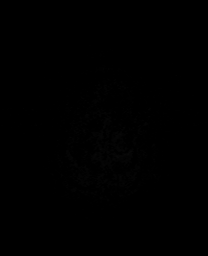

[Series 17: T2 post-contrast · coronal · 5.0mm · 0.72mm/px · 2 of 28 slices shown]
[im 1/28]
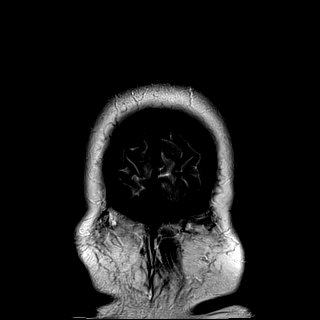
[im 28/28]
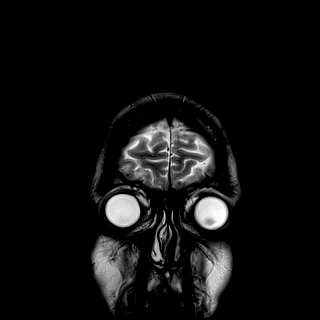

[Series 19: T1 post-contrast · coronal · 5.0mm · 0.34mm/px · 2 of 30 slices shown (1 of 2)]
[im 1/30]
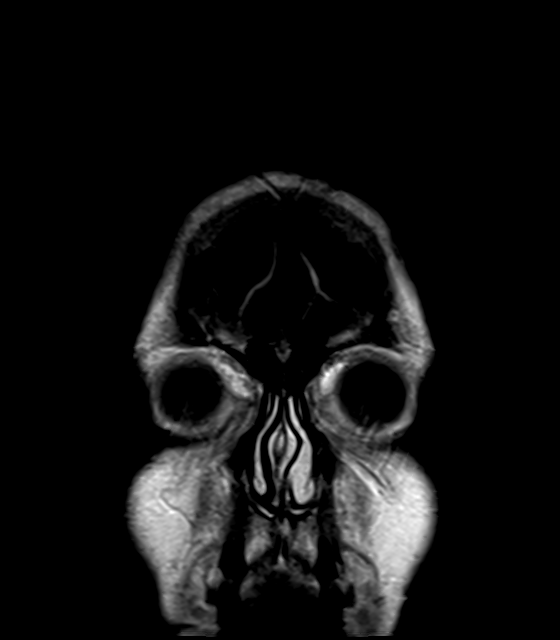
[im 30/30]
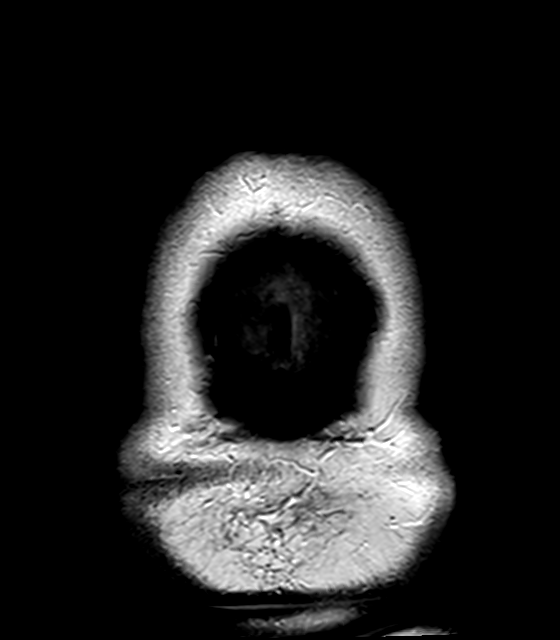

[Series 20: T1 post-contrast · sagittal · 5.0mm · 0.78mm/px · 2 of 25 slices shown (2 of 2)]
[im 1/25]
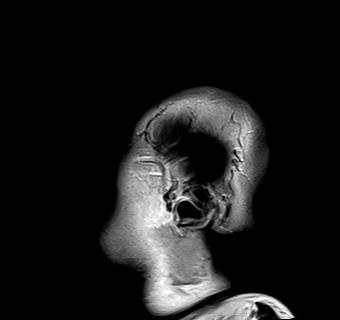
[im 25/25]
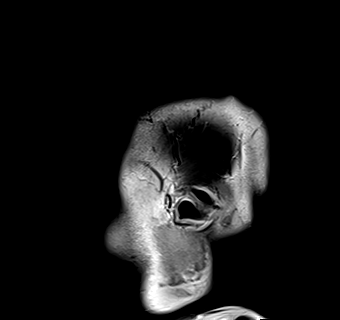

[40 of 48 positions shown; findings below may reference images not displayed]

FINDINGS: Brain: At least 34 brain lesions with hemorrhagic features,
consistent with metastatic disease from the patient's renal cell
carcinoma. These are marked on postcontrast series 18. Some of these
are avidly enhancing, others are best seen on gradient imaging as
areas of chronic blood products. A few of the lesions show T1
hyperintensity for subacute hemorrhage and methemoglobin, especially
along the low left parietal lobe on [DATE], measuring 13 mm. Lesions
measure up to 16 mm in maximal span (measured in the inferior left
cerebrum). There are scattered areas of moderate vasogenic edema
without midline shift or herniation. Two lesions are seen at the
level of the right lateral ventricular choroid, mainly as areas of
prior hemorrhage.

No superimposed infarct, hydrocephalus, or collection.

Vascular: Normal flow voids.

Skull and upper cervical spine: No focal marrow lesion.

Sinuses/Orbits: Negative
IMPRESSION: Multiple hemorrhagic brain masses consistent with metastatic disease
in this patient with history of renal cell carcinoma. At least 34
lesions are seen. Up to moderate vasogenic edema without midline
shift or herniation.

## 2020-04-22 MED ORDER — AMLODIPINE BESYLATE 10 MG PO TABS
10.0000 mg | ORAL_TABLET | Freq: Every day | ORAL | Status: DC
  Administered 2020-04-22 – 2020-04-23 (×2): 10 mg via ORAL
  Filled 2020-04-22: qty 1
  Filled 2020-04-22: qty 2

## 2020-04-22 MED ORDER — ACETAMINOPHEN 325 MG PO TABS
650.0000 mg | ORAL_TABLET | Freq: Four times a day (QID) | ORAL | Status: DC | PRN
  Filled 2020-04-22: qty 2

## 2020-04-22 MED ORDER — GADOBUTROL 1 MMOL/ML IV SOLN
10.0000 mL | Freq: Once | INTRAVENOUS | Status: AC | PRN
  Administered 2020-04-22: 10 mL via INTRAVENOUS

## 2020-04-22 MED ORDER — METOPROLOL SUCCINATE ER 100 MG PO TB24
100.0000 mg | ORAL_TABLET | Freq: Every day | ORAL | Status: DC
  Administered 2020-04-23: 100 mg via ORAL
  Filled 2020-04-22: qty 1

## 2020-04-22 MED ORDER — MELATONIN 3 MG PO TABS: 3.0000 mg | ORAL_TABLET | Freq: Every day | ORAL | Status: DC

## 2020-04-22 MED ORDER — DEXAMETHASONE 4 MG PO TABS
4.0000 mg | ORAL_TABLET | Freq: Four times a day (QID) | ORAL | Status: DC
  Administered 2020-04-22 – 2020-04-23 (×3): 4 mg via ORAL
  Filled 2020-04-22 (×3): qty 1

## 2020-04-22 MED ORDER — SODIUM CHLORIDE 0.9 % IV SOLN: Freq: Once | INTRAVENOUS | Status: AC

## 2020-04-22 MED ORDER — ONDANSETRON HCL 4 MG/2ML IJ SOLN: 4.0000 mg | Freq: Four times a day (QID) | INTRAMUSCULAR | Status: DC | PRN

## 2020-04-22 MED ORDER — DEXAMETHASONE SODIUM PHOSPHATE 10 MG/ML IJ SOLN
10.0000 mg | Freq: Once | INTRAMUSCULAR | Status: AC
  Administered 2020-04-22: 10 mg via INTRAVENOUS
  Filled 2020-04-22: qty 1

## 2020-04-22 MED ORDER — ONDANSETRON HCL 4 MG PO TABS: 4.0000 mg | ORAL_TABLET | Freq: Four times a day (QID) | ORAL | Status: DC | PRN

## 2020-04-22 MED ORDER — ACETAMINOPHEN 650 MG RE SUPP: 650.0000 mg | Freq: Four times a day (QID) | RECTAL | Status: DC | PRN

## 2020-04-22 MED ORDER — LORAZEPAM 2 MG/ML IJ SOLN
1.0000 mg | Freq: Once | INTRAMUSCULAR | Status: AC
  Administered 2020-04-22: 1 mg via INTRAVENOUS
  Filled 2020-04-22: qty 1

## 2020-04-22 MED ORDER — OXYCODONE HCL 5 MG PO TABS: 5.0000 mg | ORAL_TABLET | ORAL | Status: DC | PRN

## 2020-04-22 MED ORDER — HYDRALAZINE HCL 25 MG PO TABS: 25.0000 mg | ORAL_TABLET | Freq: Three times a day (TID) | ORAL | Status: DC | PRN

## 2020-04-22 NOTE — ED Notes (Signed)
ED Provider at bedside. 

## 2020-04-22 NOTE — Consult Note (Signed)
Consultation Note Date: 04/22/2020   Patient Name: Marcus Callahan  DOB: July 14, 1970  MRN: [id number]  Age / Sex: 49 y.o., male  PCP: Marcus Pollen, MD Referring Physician: Jonnie Finner, DO  Reason for Consultation: Establishing goals of care  HPI/Patient Profile: 49 y.o. male  with past medical history of hypertension, renal cell carcinoma stage IV receiving chemotherapy presented to the ED on 04/22/20 for right lower extremity weakness and unable to move his right leg.   ED Course: W/u in ED w/ MRI. It revealed multiple hemorrhagic brain masses consistent with metastatic disease. He was unable to complete the spinal portion of the exam d/t anxiety. Case was discussed with neurosurgery who recommended waiting the 12 hours for washout of contrast and obtaining the remainder of the MRI. TRH was called for admission.   Patient and family face treatment option decisions, advanced directive decisions, and anticipatory care needs.   Clinical Assessment and Goals of Care: I have reviewed medical records including EPIC notes, labs, and imaging. Received report from primary RN - no acute concerns.   Went to visit patient at bedside - no family/visitors present. Patient was lying in bed awake, alert, oriented, and able to participate in conversation. No signs or non-verbal gestures of pain or discomfort noted. No respiratory distress, increased work of breathing, or secretions noted. Patient denied having any pain, nausea, or shortness of breath.  Met with patient to discuss diagnosis, prognosis, GOC, EOL wishes, disposition, and options.  I introduced Palliative Medicine as specialized medical care for people living with serious illness. It focuses on providing relief from the symptoms and stress of a serious illness. The goal is to improve quality of life for both the patient and the family.  We  discussed a brief life review of the patient as well as functional and nutritional status. Marcus Callahan is not married and does not have biological children, but tells me he helped raise the daughter of a girlfriend like she was his. Prior to hospitalization, the patient lived at home where he is the primary caretaker of his 60yo father. Marcus Callahan tells me that his brothers and sisters are very supportive in their father's care while he is in the hospital/receiving medical treatment. At home, the patient states he was fully independent and has had no issues eating/drinking. However, this past week he has had two episodes of nausea and vomiting after drinking a Gatorade and soda, which is new for him - he is not nauseated currently. The patient was first diagnosed with renal cell carcinoma in February 2021. He had surgery to remove the kidney on August 25, 2019 and has since been undergoing immunotherapy to try and shrink a lesion that was also seen on his liver. The patient has not noticed any physical or cognitive decline until his incident yesterday with right lower extremity weakness/inability to purposefully move extremity, which brought him to the ED.  We discussed patient's current illness and what it means in the larger context of patient's on-going co-morbidities.  Patient had a clear understanding of his current medical situation. During visit, neurosurgery briefly came by to update patient on plan of care - to get MRI of spine today, have PT see him, discharge today, and tumor board evaluation next week with the possibility of whole brain radiation moving forward. Emotional support and theraputic listening was provided to patient after this visit. Natural disease trajectory and expectations at EOL were discussed. I attempted to elicit values and goals of care important to the patient. The difference between aggressive medical intervention and comfort care was considered in light of the patient's  goals of care. The patient states that right now he does want to pursue further tests to gain more insight and information into his current situation. With the information he has today, he wants to proceed with any recommended treatments/procedures.  Palliative Care services outpatient were explained and offered - patient politely declined.  Advance directives, concepts specific to code status, artificial feeding and hydration, and rehospitalization were considered and discussed. Patient states that he has paperwork at home to complete Living Will and HCPOA. Discussed the importance of getting paperwork completed - offered chaplain services to help complete documents while he is here - he politely declined. He did state he wants his brother/Marcus Callahan to be primary surrogate decision maker and sister/Marcus Callahan to be secondary. MOST form was introduced and reviewed - patient did not want to complete today, but wanted to take blank copy home to further think about options and complete at a later time. Patient expressed desire for Full Code Status and Full Scope Treatment but wanted time to think about other options. Discussed DNR/DNI status - patient understands evidenced based poor outcomes in similar hospitalized patient, as the cause of arrest is likely associated with advanced chronic illness rather than an easily reversible acute cardio-pulmonary event. Patient was not agreeable to DNR/DNI with understanding that he would receive CPR, defibrillation, ACLS medications, and intubation.   Visit also consisted of discussions dealing with the complex and emotionally intense issues of symptom management and palliative care in the setting of serious and potentially life-threatening illness. Palliative care team will continue to support patient, patient's family, and medical team.  Discussed with patient the importance of continued conversation with family and the medical providers regarding overall plan of  care and treatment options, ensuring decisions are within the context of the patient's values and GOCs.    Questions and concerns were addressed. The patient was encouraged to call with questions and/or concerns. PMT card was provided.   Primary Decision Maker: PATIENT    SUMMARY OF RECOMMENDATIONS:  Continue current full scope medical treatment  Continue full code status  At this time, patient is willing to proceed with all recommended tests/procedures  Patient wants his brother/Marcus to be primary surrogate decision maker and sister/Marcus to be second.   Patient declined chaplain visit to complete HCPOA and Living Will as well as outpatient Palliative Care follow up  Blank MOST form left at bedside - please help complete or notify PMT if patient becomes interested in completing  PMT will continue to follow holistically     Code Status/Advance Care Planning:  Full code  Palliative Prophylaxis:   Frequent Pain Assessment  Additional Recommendations (Limitations, Scope, Preferences):  Full Scope Treatment  Psycho-social/Spiritual:   Desire for further Chaplaincy support:no Created space and opportunity for patient to express thoughts and feelings regarding patient's current medical situation.   Emotional support provided.  Prognosis:   Unable to determine  Discharge Planning: Home, declined outpatient Palliative Care services     Primary Diagnoses: Present on Admission: . Brain metastases ([place])   I have reviewed the medical record, interviewed the patient and family, and examined the patient. The following aspects are pertinent.  Past Medical History:  Diagnosis Date  . Allergy    no diagnosis per pt   . Hypertension    Social History   Socioeconomic History  . Marital status: Single    Spouse name: Not on file  . Number of children: Not on file  . Years of education: Not on file  . Highest education level: Not on file  Occupational History  .  Not on file  Tobacco Use  . Smoking status: Never Smoker  . Smokeless tobacco: Never Used  Vaping Use  . Vaping Use: Never used  Substance and Sexual Activity  . Alcohol use: Not Currently    Comment: occassionaly  . Drug use: Never  . Sexual activity: Not on file  Other Topics Concern  . Not on file  Social History Narrative  . Not on file   Social Determinants of Health   Financial Resource Strain:   . Difficulty of Paying Living Expenses: Not on file  Food Insecurity:   . Worried About Charity fundraiser in the Last Year: Not on file  . Ran Out of Food in the Last Year: Not on file  Transportation Needs:   . Lack of Transportation (Medical): Not on file  . Lack of Transportation (Non-Medical): Not on file  Physical Activity:   . Days of Exercise per Week: Not on file  . Minutes of Exercise per Session: Not on file  Stress:   . Feeling of Stress : Not on file  Social Connections:   . Frequency of Communication with Friends and Family: Not on file  . Frequency of Social Gatherings with Friends and Family: Not on file  . Attends Religious Services: Not on file  . Active Member of Clubs or Organizations: Not on file  . Attends Archivist Meetings: Not on file  . Marital Status: Not on file   Family History  Problem Relation Age of Onset  . Colon polyps Mother   . Colon cancer Neg Hx   . Esophageal cancer Neg Hx   . Prostate cancer Neg Hx   . Rectal cancer Neg Hx    Scheduled Meds: . amLODipine  10 mg Oral Daily  . dexamethasone (DECADRON) injection  10 mg Intravenous Once  . dexamethasone  4 mg Oral Q6H  . [START ON 04/23/2020] metoprolol succinate  100 mg Oral Daily   Continuous Infusions: PRN Meds:.acetaminophen **OR** acetaminophen, hydrALAZINE, ondansetron **OR** ondansetron (ZOFRAN) IV Medications Prior to Admission:  Prior to Admission medications   Medication Sig Start Date End Date Taking? Authorizing Provider  acetaminophen (TYLENOL) 500 MG  tablet Take 1,000 mg by mouth every 6 (six) hours as needed for mild pain.   Yes [provider]  amLODipine (NORVASC) 10 MG tablet Take 1 tablet (10 mg total) by mouth daily. 11/23/19 04/22/20 Yes Sagardia, Ines Bloomer, MD  lisinopril-hydrochlorothiazide (ZESTORETIC) 20-25 MG tablet TAKE 1 TABLET BY MOUTH DAILY 10/31/19  Yes Sagardia, Ines Bloomer, MD  metoprolol succinate (TOPROL-XL) 100 MG 24 hr tablet Take 1 tablet (100 mg total) by mouth daily. Take with or immediately following a meal. 11/23/19 04/22/20 Yes Sagardia, Ines Bloomer, MD  Multiple Vitamin (MULTIVITAMIN) LIQD Take 5 mLs by mouth daily.   Yes [provider]  INLYTA 5 MG tablet TAKE 1 TABLET TWICE A DAY 02/16/20   Wyatt Portela, MD   Allergies  Allergen Reactions  . Codeine Other (See Comments)    Nose bleeds   Review of Systems  Constitutional: Positive for activity change. Negative for appetite change, fatigue and unexpected weight change.  HENT: Negative for trouble swallowing.   Respiratory: Negative for shortness of breath.   Gastrointestinal: Positive for nausea and vomiting.  Musculoskeletal: Positive for gait problem.  Neurological: Positive for weakness.       Weakness of right lower extremity  Psychiatric/Behavioral: Negative for confusion.  All other systems reviewed and are negative.   Physical Exam Vitals and nursing note reviewed.  Constitutional:      General: He is not in acute distress. Pulmonary:     Effort: No respiratory distress.  Skin:    General: Skin is warm and dry.  Neurological:     Mental Status: He is alert and oriented to person, place, and time.     Motor: Weakness (to right lower extremity) present.  Psychiatric:        Attention and Perception: Attention normal.        Behavior: Behavior is cooperative.        Cognition and Memory: Cognition and memory normal.     Vital Signs: BP 122/75   Pulse 85   Temp 99.2 F (37.3 C) (Oral)   Resp 15   Ht '5\' 10"'  (1.778  m)   Wt 104.3 kg   SpO2 100%   BMI 33.00 kg/m  Pain Scale: 0-10   Pain Score: 0-No pain   SpO2: SpO2: 100 % O2 Device:SpO2: 100 % O2 Flow Rate: .   IO: Intake/output summary: No intake or output data in the 24 hours ending 04/22/20 1121  LBM:   Baseline Weight: Weight: 104.3 kg Most recent weight: Weight: 104.3 kg     Palliative Assessment/Data: PPS 90%     Time In: 1140 Time Out: 1252 Time Total: 72 minutes  Greater than 50%  of this time was spent counseling and coordinating care related to the above assessment and plan.  Signed by: Lin Landsman, NP   Please contact Palliative Medicine Team phone at [phone] for questions and concerns.  For individual provider: See Shea Evans

## 2020-04-22 NOTE — H&P (Addendum)
History and Physical    [name] GGE:[id number] DOB: March 09, 1971 DOA: 04/22/2020  PCP: [name], MD  Patient coming from: Home  Chief Complaint: Right leg weakness  HPI: Marcus Callahan is a 49 y.o. male with medical history significant of renal cell carcinoma. Presenting with RLE weakness. He reports that yesterday he was climbing the steps to his house and noted that he was feeling a little lightheaded. He decided to lay down. It was then that he notice that he could not move the toes in his RLE. He says this progressed throughout the evening to the point were he couldn't move his whole right leg. He became concerned and came to the ED.   ED Course: W/u in ED w/ MRI. It revealed multiple hemorrhagic brain masses consistent with metastatic disease. He was unable to complete the spinal portion of the exam d/t anxiety. Case was discussed with neurosurgery who recommended waiting the 12 hours for washout of contrast and obtaining the remainder of the MRI. TRH was called for admission.   Review of Systems:  Denies CP, palpitations, N/V, syncopal episodes, seizure-like episodes, bowel/bladder incontinence.  Review of systems is otherwise negative for all not mentioned in HPI.   PMHx Past Medical History:  Diagnosis Date  . Allergy    no diagnosis per pt   . Hypertension     PSHx Past Surgical History:  Procedure Laterality Date  . CYSTOSCOPY N/A 08/26/2019   Procedure: Erlene Quan;  Surgeon: Ceasar Mons, MD;  Location: [facility];  Service: Urology;  Laterality: N/A;  . ROBOT ASSISTED LAPAROSCOPIC NEPHRECTOMY Left 08/26/2019   Procedure: XI ROBOTIC ASSISTED LAPAROSCOPIC NEPHRECTOMY;  Surgeon: Ceasar Mons, MD;  Location: [facility];  Service: Urology;  Laterality: Left;  . surgery as a child     thinks matbe was a hernia repair as a premature baby     SocHx  reports that he has never smoked. He has never used smokeless tobacco. He  reports previous alcohol use. He reports that he does not use drugs.  Allergies  Allergen Reactions  . Codeine Other (See Comments)    Nose bleeds    FamHx Family History  Problem Relation Age of Onset  . Colon polyps Mother   . Colon cancer Neg Hx   . Esophageal cancer Neg Hx   . Prostate cancer Neg Hx   . Rectal cancer Neg Hx     Prior to Admission medications   Medication Sig Start Date End Date Taking? Authorizing Provider  amLODipine (NORVASC) 10 MG tablet Take 1 tablet (10 mg total) by mouth daily. 11/23/19 03/15/20  [name], MD  INLYTA 5 MG tablet TAKE 1 TABLET TWICE A DAY 02/16/20   Wyatt Portela, MD  lisinopril-hydrochlorothiazide (ZESTORETIC) 20-25 MG tablet TAKE 1 TABLET BY MOUTH DAILY 10/31/19   [name], MD  metoprolol succinate (TOPROL-XL) 100 MG 24 hr tablet Take 1 tablet (100 mg total) by mouth daily. Take with or immediately following a meal. 11/23/19 03/15/20  [name], MD    Physical Exam: Vitals:   04/21/20 2220 04/22/20 0108 04/22/20 0305 04/22/20 0638  BP: 118/81 131/83 138/78 129/82  Pulse: 77 81 84 84  Resp: 16 18 18 18   Temp: 99.4 F (37.4 C) 100 F (37.8 C) 98.5 F (36.9 C) 98.1 F (36.7 C)  TempSrc:  Oral Oral Oral  SpO2: 99% 97% 96% 100%  Weight:      Height:  General: 49 y.o. male resting in bed in NAD Eyes: PERRL, normal sclera ENMT: Nares patent w/o discharge, orophaynx clear, dentition normal, ears w/o discharge/lesions/ulcers Neck: Supple, trachea midline Cardiovascular: RRR, +S1, S2, no m/g/r, equal pulses throughout Respiratory: CTABL, no w/r/r, normal WOB GI: BS+, NDNT, no masses noted, no organomegaly noted MSK: No e/c/c Skin: No rashes, bruises, ulcerations noted Neuro: A&O x 3, RLE msk str 3/5, sensation is intake b/l Psyc: Appropriate interaction and affect, calm/cooperative  Labs on Admission: I have personally reviewed following labs and imaging studies  CBC: Recent Labs  Lab  04/22/20 0146  WBC 10.5  NEUTROABS 8.1*  HGB 8.5*  HCT 26.8*  MCV 78.8*  PLT 628   Basic Metabolic Panel: Recent Labs  Lab 04/22/20 0146  NA 136  K 4.2  CL 104  CO2 21*  GLUCOSE 124*  BUN 26*  CREATININE 1.75*  CALCIUM 9.1   GFR: Estimated Creatinine Clearance: 60.4 mL/min (A) (by C-G formula based on SCr of 1.75 mg/dL (H)). Liver Function Tests: No results for input(s): AST, ALT, ALKPHOS, BILITOT, PROT, ALBUMIN in the last 168 hours. No results for input(s): LIPASE, AMYLASE in the last 168 hours. No results for input(s): AMMONIA in the last 168 hours. Coagulation Profile: No results for input(s): INR, PROTIME in the last 168 hours. Cardiac Enzymes: No results for input(s): CKTOTAL, CKMB, CKMBINDEX, TROPONINI in the last 168 hours. BNP (last 3 results) No results for input(s): PROBNP in the last 8760 hours. HbA1C: No results for input(s): HGBA1C in the last 72 hours. CBG: No results for input(s): GLUCAP in the last 168 hours. Lipid Profile: No results for input(s): CHOL, HDL, LDLCALC, TRIG, CHOLHDL, LDLDIRECT in the last 72 hours. Thyroid Function Tests: No results for input(s): TSH, T4TOTAL, FREET4, T3FREE, THYROIDAB in the last 72 hours. Anemia Panel: No results for input(s): VITAMINB12, FOLATE, FERRITIN, TIBC, IRON, RETICCTPCT in the last 72 hours. Urine analysis:    Component Value Date/Time   COLORURINE YELLOW 08/21/2019 [place] 08/21/2019 1549   LABSPEC 1.019 08/21/2019 1549   PHURINE 5.0 08/21/2019 1549   GLUCOSEU NEGATIVE 08/21/2019 1549   HGBUR LARGE (A) 08/21/2019 1549   BILIRUBINUR NEGATIVE 08/21/2019 1549   BILIRUBINUR negative 07/06/2019 1627   KETONESUR NEGATIVE 08/21/2019 1549   PROTEINUR NEGATIVE 08/21/2019 1549   UROBILINOGEN 0.2 07/06/2019 1627   NITRITE NEGATIVE 08/21/2019 1549   LEUKOCYTESUR NEGATIVE 08/21/2019 1549    Radiological Exams on Admission: MR Brain W and Wo Contrast  Result Date: 04/22/2020 CLINICAL  DATA:  Brain mass or lesion.  Right leg weakness. EXAM: MRI HEAD WITHOUT AND WITH CONTRAST TECHNIQUE: Multiplanar, multiecho pulse sequences of the brain and surrounding structures were obtained without and with intravenous contrast. CONTRAST:  23mL GADAVIST GADOBUTROL 1 MMOL/ML IV SOLN COMPARISON:  None. FINDINGS: Brain: At least 34 brain lesions with hemorrhagic features, consistent with metastatic disease from the patient's renal cell carcinoma. These are marked on postcontrast series 18. Some of these are avidly enhancing, others are best seen on gradient imaging as areas of chronic blood products. A few of the lesions show T1 hyperintensity for subacute hemorrhage and methemoglobin, especially along the low left parietal lobe on 16:33, measuring 13 mm. Lesions measure up to 16 mm in maximal span (measured in the inferior left cerebrum). There are scattered areas of moderate vasogenic edema without midline shift or herniation. Two lesions are seen at the level of the right lateral ventricular choroid, mainly as areas of prior hemorrhage. No  superimposed infarct, hydrocephalus, or collection. Vascular: Normal flow voids. Skull and upper cervical spine: No focal marrow lesion. Sinuses/Orbits: Negative IMPRESSION: Multiple hemorrhagic brain masses consistent with metastatic disease in this patient with history of renal cell carcinoma. At least 34 lesions are seen. Up to moderate vasogenic edema without midline shift or herniation. Electronically Signed   By: Monte Fantasia M.D.   On: 04/22/2020 06:55   Assessment/Plan Right leg weakness     - admit to observation, med-surg     - likely secondary to brain mets     - per neurosurgery rec, repeat MRI cervical/thoracic/lumbar spine after 12 washout of contrast (that would be any time after 1830hrs)     - no stroke seen on MRI brain     - PT/OT consult  Brain metastases     - no surgical option per neurosurgery; but may be able to do radiation     - d/w  neurosurgery Re: steroids, follow up; no role for neurosurgery; add steroids      - spoke with neuro onco; appreciate assistance, will bring before tumor board (likely whole brain radiation as only option), get imaging, add steroids, if he can walk, then ok to discharge to home after imaging.      - consult to palliative care   S/p nephrectomy     - Scr baseline is 1.4 - 1.5. he is mildly elevated     - given that he's had contrast and will need more, will run some fluids at admission  DVT prophylaxis: SCDs  Code Status: FULL  Family Communication: None at bedside  Consults called: Neurosurgery, palliative care  Admission status: Observation  Status is: Observation  The patient remains OBS appropriate and will d/c before 2 midnights.  Dispo: The patient is from: Home              Anticipated d/c is to: Home              Anticipated d/c date is: 1 day              Patient currently is not medically stable to d/c.  Jonnie Finner DO [facility]  If 7PM-7AM, please contact night-coverage [email or web address]  04/22/2020, 8:39 AM

## 2020-04-22 NOTE — ED Notes (Signed)
Patient returned from MRI.

## 2020-04-22 NOTE — ED Triage Notes (Signed)
Patient transferred to [facility] ED via [facility] from [facility] ED for MRI of right leg.

## 2020-04-22 NOTE — ED Notes (Signed)
Dinner Tray Ordered @ 1657. 

## 2020-04-22 NOTE — ED Provider Notes (Signed)
[facility] DEPT Provider Note   CSN: [id number] Arrival date & time: 04/21/20  1853     History Chief Complaint  Patient presents with   leg issues    Marcus Callahan is a 49 y.o. male.  49 y/o male with hx of stage IV renal carcinoma (Opdivo and Yervoy infusions, last on 04/08/20) and HTN presents to the ED for evaluation of right lower extremity weakness.  Patient states that he got out of the car this evening and felt an episode of fleeting lightheadedness.  He subsequently made his way into the house and lay down on the couch.  Realized that he was unable to wiggle the toes on his right foot.  Progressively noticed inability to move his right leg.  Has since regained ability to externally rotate, but is persistently unable to lift his right leg.  He noticed some back pain yesterday, but denies back pain at present.  Denies upper extremity weakness, fever, bowel/bladder incontinence, RLE pain, and numbness.    The history is provided by the patient. No language interpreter was used.       Past Medical History:  Diagnosis Date   Allergy    no diagnosis per pt    Hypertension     Patient Active Problem List   Diagnosis Date Noted   Goals of care, counseling/discussion 09/08/2019   Kidney cancer, primary, with metastasis from kidney to other site [facility]) 09/08/2019   Renal mass 08/26/2019   Essential hypertension 11/18/2018    Past Surgical History:  Procedure Laterality Date   CYSTOSCOPY N/A 08/26/2019   Procedure: CYSTOSCOPY FLEXIBLE;  Surgeon: Ceasar Mons, MD;  Location: [facility] ORS;  Service: Urology;  Laterality: N/A;   ROBOT ASSISTED LAPAROSCOPIC NEPHRECTOMY Left 08/26/2019   Procedure: XI ROBOTIC ASSISTED LAPAROSCOPIC NEPHRECTOMY;  Surgeon: Ceasar Mons, MD;  Location: [facility] ORS;  Service: Urology;  Laterality: Left;   surgery as a child     thinks matbe was a hernia repair as a premature baby         Family History  Problem Relation Age of Onset   Colon polyps Mother    Colon cancer Neg Hx    Esophageal cancer Neg Hx    Prostate cancer Neg Hx    Rectal cancer Neg Hx     Social History   Tobacco Use   Smoking status: Never Smoker   Smokeless tobacco: Never Used  Vaping Use   Vaping Use: Never used  Substance Use Topics   Alcohol use: Not Currently    Comment: occassionaly   Drug use: Never    Home Medications Prior to Admission medications   Medication Sig Start Date End Date Taking? Authorizing Provider  amLODipine (NORVASC) 10 MG tablet Take 1 tablet (10 mg total) by mouth daily. 11/23/19 03/15/20  [name], MD  INLYTA 5 MG tablet TAKE 1 TABLET TWICE A DAY 02/16/20   Wyatt Portela, MD  lisinopril-hydrochlorothiazide (ZESTORETIC) 20-25 MG tablet TAKE 1 TABLET BY MOUTH DAILY 10/31/19   [name], MD  metoprolol succinate (TOPROL-XL) 100 MG 24 hr tablet Take 1 tablet (100 mg total) by mouth daily. Take with or immediately following a meal. 11/23/19 03/15/20  [name], MD    Allergies    Codeine  Review of Systems   Review of Systems  Ten systems reviewed and are negative for acute change, except as noted in the HPI.    Physical Exam Updated Vital Signs BP  138/78 (BP Location: Right Arm)    Pulse 84    Temp 98.5 F (36.9 C) (Oral)    Resp 18    Ht 5\' 10"  (1.778 m)    Wt 104.3 kg    SpO2 96%    BMI 33.00 kg/m   Physical Exam Vitals and nursing note reviewed.  Constitutional:      General: He is not in acute distress.    Appearance: He is well-developed. He is not diaphoretic.     Comments: Nontoxic appearing and in NAD  HENT:     Head: Normocephalic and atraumatic.  Eyes:     General: No scleral icterus.    Conjunctiva/sclera: Conjunctivae normal.  Cardiovascular:     Rate and Rhythm: Normal rate and regular rhythm.     Pulses: Normal pulses.     Comments: R femoral pulse and DP pulse 2+ in the  RLE. Pulmonary:     Effort: Pulmonary effort is normal. No respiratory distress.     Comments: Respirations even and unlabored Musculoskeletal:     Cervical back: Normal range of motion.  Skin:    General: Skin is warm and dry.     Coloration: Skin is not pale.     Findings: No erythema or rash.     Comments: Lower extremities are both warm and well perfused  Neurological:     Mental Status: He is alert and oriented to person, place, and time.     Motor: Weakness present.     Comments: Can internally and externally rotate RLE; however, unable to raise RLE off bed, wiggle toes of right foot. No dorsiflexion or plantar flexion against resistance. Difficult to elicit patellar and achilles DTRs in BLE, but dorsiflexion and plantar flexion of the L foot 5/5 against resistance. Upgoing Babinski on the right, downgoing on the left. Full AROM of the LLE.  Psychiatric:        Behavior: Behavior normal.     ED Results / Procedures / Treatments   Labs (all labs ordered are listed, but only abnormal results are displayed) Labs Reviewed  CBC WITH DIFFERENTIAL/PLATELET - Abnormal; Notable for the following components:      Result Value   RBC 3.40 (*)    Hemoglobin 8.5 (*)    HCT 26.8 (*)    MCV 78.8 (*)    MCH 25.0 (*)    Neutro Abs 8.1 (*)    All other components within normal limits  BASIC METABOLIC PANEL - Abnormal; Notable for the following components:   CO2 21 (*)    Glucose, Bld 124 (*)    BUN 26 (*)    Creatinine, Ser 1.75 (*)    GFR, Estimated 47 (*)    All other components within normal limits  RESPIRATORY PANEL BY RT PCR (FLU A&B, COVID)    EKG None  Radiology No results found.  Procedures Procedures (including critical care time)  Medications Ordered in ED Medications - No data to display   ED Course  I have reviewed the triage vital signs and the nursing notes.  Pertinent labs & imaging results that were available during my care of the patient were reviewed by  me and considered in my medical decision making (see chart for details).  Clinical Course as of Apr 22 350  Fri Apr 22, 2020  0132 Spoke with Dr. Leonel Ramsay of Neurology regarding patient's symptoms and history. Recommends MRI brain to r/o ACA stroke as cause of R limb weakness. Given no changes  to speech or RUE and hx of cancer, cause may be peripheral - C/T/L spine added. Will transfer to [facility] for MRI imaging.   [RV]  2023 Spoke with MD Cardama. Patient accepted in transfer for MRI imaging.   [KH]    Clinical Course User Index [KH] Antonietta Breach, PA-C   MDM Rules/Calculators/A&P                          49 y/o male, actively receiving chemotherapy infusions q 3 weeks for stage IV renal cell carcinoma, presents for evaluation of sudden onset RLE weakness with onset at 1800 tonight.  Denies fevers, back pain, incontinence.  Has palpable femoral and DP pulse in the RLE.  Unable to raise R leg off bed; has regained ability to internally and externally rotate the leg.  Difficulty eliciting DTRs in BLE, but with upgoing Babinski on the right.  Concern for cancer metastasis resulting in cord compression. Case discussed with Neurology as well. Dr. Leonel Ramsay advises addition of MRI brain to r/o acute ACA stroke. Doubt vascular occlusion given warm, well perfused extremity with palpable pulses. Patient transferred to [facility] for MRI evaluation. Accepted by MD Cardama.   Final Clinical Impression(s) / ED Diagnoses Final diagnoses:  Right leg weakness    Rx / DC Orders ED Discharge Orders    None       Antonietta Breach, PA-C 04/22/20 [place], April, MD 04/22/20 [phone]

## 2020-04-22 NOTE — Progress Notes (Signed)
Patient ID: Traylon A Leaf, male   DOB: 07/05/1970, 49 y.o.   MRN: [id number] We were called about this gentleman with R LE monoparesis, h/o renal cell with mets to liver, CT abd 2 months ago showing worsening dz despite tx. MRI brain shows 34 lesions c/w mets to brain, no dominant lesion but there is a lesion in the L ACA territory in or just in front of the motor strip, and this could certainly account for his leg weakness. MRI of spine ordered. I was asked about decadron dosing and I think 4q6 is reasonable for now, with tapering per the primary team or oncology  1. Let us know if the MRI of the spine shows a lesion that would also account for his leg weakness, but I suspect it's the brain lesion since he had a clean CT spine 7-8 wks ago.   2. Given there are 34 mets with no dominant met needing resection, there is no real role of neurosurgery unless spine mets are seen or the pt requires SRS. onc and rad onc should be consulted. 

## 2020-04-22 NOTE — ED Notes (Signed)
Patient transported to MRI 

## 2020-04-22 NOTE — Plan of Care (Signed)
Discussed safety precautions with pt such as bed alarm, call light use, nonskid socks, frequent turning, ADL assistive devices such as walker. Call light within reach and pt demonstrates appropriate use. This RN's phone number is on the board and pt is aware to call with needs and oriented appropriately to do such (Ao4). Bed locked and low, side rails x3 per pt request.

## 2020-04-22 NOTE — ED Notes (Signed)
This nurse asked physician about possibly ordering a CT or MRI while in triage. No new orders placed at this time.

## 2020-04-22 NOTE — ED Provider Notes (Signed)
  Physical Exam  BP 129/82 (BP Location: Right Arm)   Pulse 84   Temp 98.1 F (36.7 C) (Oral)   Resp 18   Ht 5\' 10"  (1.778 m)   Wt 104.3 kg   SpO2 100%   BMI 33.00 kg/m   Physical Exam  ED Course/Procedures   Clinical Course as of Apr 22 756  Fri Apr 22, 2020  0132 Spoke with Dr. Leonel Ramsay of Neurology regarding patient's symptoms and history. Recommends MRI brain to r/o ACA stroke as cause of R limb weakness. Given no changes to speech or RUE and hx of cancer, cause may be peripheral - C/T/L spine added. Will transfer to [facility] for MRI imaging.   [BZ]  1696 Spoke with MD Cardama. Patient accepted in transfer for MRI imaging.   [KH]    Clinical Course User Index [KH] Antonietta Breach, PA-C    Procedures  MDM   Received patient in signout.  Right leg weakness.  Unfortunately MRI shows metastatic disease to his brain.  Has been unable to get contrast scan due to some difficulty obtaining the MRI.  Discussed with Dr. Ronnald Ramp from neurosurgery.  Would need a 12-hour wait in order to complete MRI with contrast and thinks it is worth the weight due to lack of recent findings on CT.  Will admit to internal medicine however for Dr. Ronnald Ramp may also need goals of care discussion.     Davonna Belling, MD 04/22/20 [phone]

## 2020-04-23 DIAGNOSIS — C7931 Secondary malignant neoplasm of brain: Secondary | ICD-10-CM | POA: Diagnosis not present

## 2020-04-23 DIAGNOSIS — R29898 Other symptoms and signs involving the musculoskeletal system: Secondary | ICD-10-CM | POA: Diagnosis not present

## 2020-04-23 LAB — CBC
HCT: 28.4 % — ABNORMAL LOW (ref 39.0–52.0)
Hemoglobin: 8.7 g/dL — ABNORMAL LOW (ref 13.0–17.0)
MCH: 24.2 pg — ABNORMAL LOW (ref 26.0–34.0)
MCHC: 30.6 g/dL (ref 30.0–36.0)
MCV: 79.1 fL — ABNORMAL LOW (ref 80.0–100.0)
Platelets: 228 10*3/uL (ref 150–400)
RBC: 3.59 MIL/uL — ABNORMAL LOW (ref 4.22–5.81)
RDW: 14.6 % (ref 11.5–15.5)
WBC: 9.1 10*3/uL (ref 4.0–10.5)
nRBC: 0 % (ref 0.0–0.2)

## 2020-04-23 LAB — COMPREHENSIVE METABOLIC PANEL
ALT: 134 U/L — ABNORMAL HIGH (ref 0–44)
AST: 198 U/L — ABNORMAL HIGH (ref 15–41)
Albumin: 3.4 g/dL — ABNORMAL LOW (ref 3.5–5.0)
Alkaline Phosphatase: 174 U/L — ABNORMAL HIGH (ref 38–126)
Anion gap: 13 (ref 5–15)
BUN: 22 mg/dL — ABNORMAL HIGH (ref 6–20)
CO2: 17 mmol/L — ABNORMAL LOW (ref 22–32)
Calcium: 9.6 mg/dL (ref 8.9–10.3)
Chloride: 106 mmol/L (ref 98–111)
Creatinine, Ser: 1.63 mg/dL — ABNORMAL HIGH (ref 0.61–1.24)
GFR, Estimated: 51 mL/min — ABNORMAL LOW (ref >60)
Glucose, Bld: 123 mg/dL — ABNORMAL HIGH (ref 70–99)
Potassium: 5.5 mmol/L — ABNORMAL HIGH (ref 3.5–5.1)
Sodium: 136 mmol/L (ref 135–145)
Total Bilirubin: 1 mg/dL (ref 0.3–1.2)
Total Protein: 6.8 g/dL (ref 6.5–8.1)

## 2020-04-23 MED ORDER — SODIUM CHLORIDE 0.9 % IV BOLUS
250.0000 mL | Freq: Once | INTRAVENOUS | Status: AC
  Administered 2020-04-23: 250 mL via INTRAVENOUS

## 2020-04-23 MED ORDER — DEXAMETHASONE 4 MG PO TABS: 4.0000 mg | ORAL_TABLET | Freq: Four times a day (QID) | ORAL | 0 refills | Status: AC

## 2020-04-23 NOTE — Evaluation (Signed)
Occupational Therapy Evaluation °Patient Details °Name: Marcus Callahan °MRN: [id number] °DOB: 09/24/1970 °Today's Date: 04/23/2020 ° ° ° °History of Present Illness 49 y.o. male with medical history significant of renal cell carcinoma. Presenting with RLE weakness. Weakness progressed to the point of not being able to move RLE later in the evening. MRI revealed multiple hemorrhagic brain masses consistent with metastatic disease.  ° °Clinical Impression °  °This 49 y/o male presents with the above. PTA pt reports being independent with ADL, iADL and functional mobility, is working. Pt pleasant and willing to participate in therapy session today; completing room level mobility tasks using RW, LB and standing grooming ADL with minguard assist throughout. Educated pt in general safety and compensatory techniques for using RW/DME during ADL/functional tasks with pt verbalizing/return demonstrating understanding throughout. He will benefit from continued acute OT services and recommend follow up HHOT services after discharge to maximize his overall safety and independence with ADL and mobility.  °   °Follow Up Recommendations ° Home health OT;Supervision/Assistance - 24 hour (24hr initially)  °  °Equipment Recommendations ° None recommended by OT (pt's DME needs are met )  °  °   ° ° °  °Precautions / Restrictions Precautions °Precautions: Fall °Restrictions °Weight Bearing Restrictions: No  ° °  ° °Mobility Bed Mobility °Overal bed mobility: Needs Assistance °Bed Mobility: Supine to Sit;Sit to Supine °  °  °Supine to sit: Supervision °Sit to supine: Supervision °  °  °  ° °Transfers °Overall transfer level: Needs assistance °Equipment used: Rolling walker (2 wheeled) °Transfers: Sit to/from Stand °Sit to Stand: Min guard °Stand pivot transfers: Supervision °  °  °  °General transfer comment: for balance and safety, pt initially requiring increased effort but once initiated demonstrates good power up to stand  ° °   °Balance Overall balance assessment: Needs assistance °Sitting-balance support: No upper extremity supported;Feet supported °Sitting balance-Leahy Scale: Good °  °  °Standing balance support: Single extremity supported;Bilateral upper extremity supported °Standing balance-Leahy Scale: Poor °Standing balance comment: reliant on UE support of RW °  °  °  °  °  °  °  °  °  °  °  °   ° °ADL either performed or assessed with clinical judgement  ° °ADL Overall ADL's : Needs assistance/impaired °Eating/Feeding: Modified independent °  °Grooming: Wash/dry hands;Min guard;Standing °  °Upper Body Bathing: Modified independent;Sitting °  °Lower Body Bathing: Min guard;Sit to/from stand °Lower Body Bathing Details (indicate cue type and reason): recommended pt perform bathing task in sitting for added safety given RLE weakness  °Upper Body Dressing : Modified independent;Sitting °  °Lower Body Dressing: Min guard;Sit to/from stand °Lower Body Dressing Details (indicate cue type and reason): pt reports can utilized figure 4 for RLE °Toilet Transfer: Min guard;Ambulation;RW °Toilet Transfer Details (indicate cue type and reason): simulated via transfer to/from EOB  °Toileting- Clothing Manipulation and Hygiene: Min guard;Sit to/from stand °  °  °Tub/Shower Transfer Details (indicate cue type and reason): verbally reviewed safe transfer techniques. pt reports mother has tub bench she is planning to loan to patient. educated in safe transfer techniques using tub bench °Functional mobility during ADLs: Min guard;Rolling walker °General ADL Comments: educated in safety with ADL/functional tasks while using RW   ° ° ° °   °   °   °   °  °   °  ° °Pertinent Vitals/Pain Pain Assessment: No/denies pain  ° ° ° °Hand Dominance Right °  °  Extremity/Trunk Assessment Upper Extremity Assessment Upper Extremity Assessment: RUE deficits/detail;LUE deficits/detail (strength WFL) RUE Coordination:  (mild tremor noted, pt reports he is  nervous) LUE Coordination:  (mild tremor noted, pt reports he is nervous)   Lower Extremity Assessment Lower Extremity Assessment: Defer to PT evaluation RLE Deficits / Details: hip flexion 3-/5, knee flecion/extension 3-/5, ankle PF/DF 3-/5   Cervical / Trunk Assessment Cervical / Trunk Assessment: Normal   Communication Communication Communication: No difficulties   Cognition Arousal/Alertness: Awake/alert Behavior During Therapy: WFL for tasks assessed/performed Overall Cognitive Status: Within Functional Limits for tasks assessed                                     General Comments  VSS on RA    Exercises     Shoulder Instructions      Home Living Family/patient expects to be discharged to:: Private residence Living Arrangements: Parent (15 y.o. father) Available Help at Discharge: Family;Available 24 hours/day;Available PRN/intermittently (father 24/7, brother PRN) Type of Home: House Home Access: Stairs to enter;Level entry Entrance Stairs-Number of Steps: 10 +4 Entrance Stairs-Rails: Right;Left;Can reach both Home Layout: One level     Bathroom Shower/Tub: Teacher, early years/pre: Standard     Home Equipment: Cane - single point;Grab bars - toilet (mother has tub bench and shower seat per pt report )   Additional Comments: pt reports level entry at back door which he can use if necessary      Prior Functioning/Environment Level of Independence: Independent                 OT Problem List: Decreased strength;Decreased range of motion;Decreased activity tolerance;Impaired balance (sitting and/or standing);Decreased knowledge of use of DME or AE;Decreased knowledge of precautions      OT Treatment/Interventions: Self-care/ADL training;Therapeutic exercise;Energy conservation;DME and/or AE instruction;Therapeutic activities;Patient/family education;Balance training    OT Goals(Current goals can be found in the care plan  section) Acute Rehab OT Goals Patient Stated Goal: To go home OT Goal Formulation: With patient Time For Goal Achievement: 05/07/20 Potential to Achieve Goals: Good  OT Frequency: Min 2X/week   Barriers to D/C:            Co-evaluation              AM-PAC OT "6 Clicks" Daily Activity     Outcome Measure Help from another person eating meals?: None Help from another person taking care of personal grooming?: A Little Help from another person toileting, which includes using toliet, bedpan, or urinal?: A Little Help from another person bathing (including washing, rinsing, drying)?: A Little Help from another person to put on and taking off regular upper body clothing?: None Help from another person to put on and taking off regular lower body clothing?: A Little 6 Click Score: 20   End of Session Equipment Utilized During Treatment: Gait belt;Rolling walker Nurse Communication: Mobility status  Activity Tolerance: Patient tolerated treatment well Patient left: in bed;with call bell/phone within reach;with bed alarm set  OT Visit Diagnosis: Muscle weakness (generalized) (M62.81);Unsteadiness on feet (R26.81)                Time: 9826-4158 OT Time Calculation (min): 19 min Charges:  OT General Charges $OT Visit: 1 Visit OT Evaluation $OT Eval Moderate Complexity: [place], OT Acute Rehabilitation Services Pager [phone] Office [phone]   Raymondo Band 04/23/2020, 1:19  PM

## 2020-04-23 NOTE — Progress Notes (Signed)
Notified of a BP reading of 84/57. Patient asymptomatic. RN asked to get manual reading, holding parameters were added to the antihypertensive orders, and a small fluid bolus was ordered.

## 2020-04-23 NOTE — Evaluation (Signed)
Physical Therapy Evaluation Patient Details Name: Marcus Callahan MRN: [id number] DOB: 02-17-1971 Today's Date: 04/23/2020   History of Present Illness  49 y.o. male with medical history significant of renal cell carcinoma. Presenting with RLE weakness. Weakness progressed to the point of not being able to move RLE later in the evening. MRI revealed multiple hemorrhagic brain masses consistent with metastatic disease.  Clinical Impression  Pt presents to PT with deficits in functional mobility, gait, balance, strength, power, endurance, coordination. Pt with RLE weakness and noted intention tremor in BUE (may be related to pt reports of nervousness about his current medical condition). Pt without instance of R knee buckling with ambulation and seems to have better strength in quadriceps and hip when performing functional activity than on command at bed level. Pt is able to complete transfers and ambulate with supervision. Pt is also able to initiate stair training at this time. PT will benefit form continued acute PT services during admission as well as HHPT and assistance form family upon discharge. Pt will benefit from receiving a RW prior to discharge.    Follow Up Recommendations Home health PT;Supervision for mobility/OOB    Equipment Recommendations  Rolling walker with 5" wheels    Recommendations for Other Services       Precautions / Restrictions Precautions Precautions: Fall Restrictions Weight Bearing Restrictions: No      Mobility  Bed Mobility Overal bed mobility: Needs Assistance Bed Mobility: Supine to Sit;Sit to Supine     Supine to sit: Supervision Sit to supine: Supervision        Transfers Overall transfer level: Needs assistance Equipment used: Rolling walker (2 wheeled) Transfers: Sit to/from [facility] Sit to Stand: Supervision Stand pivot transfers: Supervision          Ambulation/Gait Ambulation/Gait assistance:  Supervision Gait Distance (Feet): 70 Feet Assistive device: Rolling walker (2 wheeled) Gait Pattern/deviations: Step-to pattern;Decreased dorsiflexion - right Gait velocity: reduced Gait velocity interpretation: <1.8 ft/sec, indicate of risk for recurrent falls General Gait Details: short step to gait, some R foot drag noted, no knee buckling noted at this time  Stairs Stairs: Yes Stairs assistance: Min guard Stair Management: One rail Right;Sideways;Step to pattern Number of Stairs: 2    Wheelchair Mobility    Modified Rankin (Stroke Patients Only)       Balance Overall balance assessment: Needs assistance Sitting-balance support: No upper extremity supported;Feet supported Sitting balance-Leahy Scale: Good     Standing balance support: Single extremity supported;Bilateral upper extremity supported Standing balance-Leahy Scale: Poor Standing balance comment: reliant on UE support of RW                             Pertinent Vitals/Pain Pain Assessment: No/denies pain    Home Living Family/patient expects to be discharged to:: Private residence Living Arrangements: Parent (71 y.o. father) Available Help at Discharge: Family;Available 24 hours/day;Available PRN/intermittently (father 24/7, brother PRN) Type of Home: House Home Access: Stairs to enter;Level entry Entrance Stairs-Rails: Right;Left;Can reach both Entrance Stairs-Number of Steps: 10 +4 Home Layout: One level Home Equipment: Cane - single point;Grab bars - toilet Additional Comments: pt reports level entry at back door which he can use if necessary    Prior Function Level of Independence: Independent               Hand Dominance   Dominant Hand: Right    Extremity/Trunk Assessment   Upper Extremity Assessment Upper Extremity  Assessment: RUE deficits/detail;LUE deficits/detail RUE Coordination:  (intention tremor noted, pt reports he is nervous) LUE Coordination:  (intention  tremor noted, pt reports he is nervous)    Lower Extremity Assessment Lower Extremity Assessment: RLE deficits/detail RLE Deficits / Details: hip flexion 3-/5, knee flecion/extension 3-/5, ankle PF/DF 3-/5    Cervical / Trunk Assessment Cervical / Trunk Assessment: Normal  Communication   Communication: No difficulties  Cognition Arousal/Alertness: Awake/alert Behavior During Therapy: WFL for tasks assessed/performed Overall Cognitive Status: Within Functional Limits for tasks assessed                                        General Comments General comments (skin integrity, edema, etc.): VSS on RA    Exercises     Assessment/Plan    PT Assessment Patient needs continued PT services  PT Problem List Decreased strength;Decreased activity tolerance;Decreased balance;Decreased mobility;Decreased coordination;Decreased knowledge of use of DME;Decreased safety awareness       PT Treatment Interventions DME instruction;Gait training;Stair training;Functional mobility training;Therapeutic activities;Therapeutic exercise;Balance training;Neuromuscular re-education;Patient/family education    PT Goals (Current goals can be found in the Care Plan section)  Acute Rehab PT Goals Patient Stated Goal: To go home PT Goal Formulation: With patient Time For Goal Achievement: 05/07/20 Potential to Achieve Goals: Good    Frequency Min 3X/week   Barriers to discharge        Co-evaluation               AM-PAC PT "6 Clicks" Mobility  Outcome Measure Help needed turning from your back to your side while in a flat bed without using bedrails?: None Help needed moving from lying on your back to sitting on the side of a flat bed without using bedrails?: None Help needed moving to and from a bed to a chair (including a wheelchair)?: None Help needed standing up from a chair using your arms (e.g., wheelchair or bedside chair)?: None Help needed to walk in hospital room?:  None Help needed climbing 3-5 steps with a railing? : A Little 6 Click Score: 23    End of Session Equipment Utilized During Treatment: Gait belt Activity Tolerance: Patient tolerated treatment well Patient left: in bed;with call bell/phone within reach;with bed alarm set Nurse Communication: Mobility status PT Visit Diagnosis: Other abnormalities of gait and mobility (R26.89);Muscle weakness (generalized) (M62.81);Other symptoms and signs involving the nervous system (R29.898)    Time: 0454-0981 PT Time Calculation (min) (ACUTE ONLY): 35 min   Charges:   PT Evaluation $PT Eval Moderate Complexity: 1 Mod PT Treatments $Gait Training: 8-22 mins        Zenaida Niece, PT, DPT Acute Rehabilitation Pager: [phone]   Zenaida Niece 04/23/2020, 10:55 AM

## 2020-04-23 NOTE — Progress Notes (Signed)
Paged on call MD about BP 80's over 50's. Received call back, continue to monitor, and "I'll look through his chart and see - maybe we can give him some fluids or something, but I am not terribly concerned if it is just this one for now." Will await any new orders. Pt is resting comfortably, has not had any medications which might cause this other than IV ativan at approximately 1730 to help the pt complete MRI. Discussed this with MD. There were new pain meds and sleep meds ordered earlier this evening, however, the pt ended up feeling he could go to sleep himself and was successful with that, so did not need any of these tonight. Has also denied pain since the initial discomfort, "moving to the bed really jostled me up but I'm okay now." pt denies dizziness, headache, or other symptoms of low BP and is resting peacefully at this time.

## 2020-04-23 NOTE — TOC Transition Note (Signed)
Transition of Care [facility] - CM/SW Discharge Note   Patient Details  Name: Marcus Callahan MRN: [id number] Date of Birth: 1970/07/17  [facility] CM/SW Contact:  Carles Collet, RN Phone Number: 04/23/2020, 12:01 PM   Clinical Narrative:    [place] services arranged through [facility]. RW to be delivered to room today prior to DC.     Final next level of care: [place] Barriers to Discharge: No Barriers Identified   Patient Goals and CMS Choice Patient states their goals for this hospitalization and ongoing recovery are:: to go home CMS Medicare.gov Compare Post Acute Care list provided to:: Patient Choice offered to / list presented to : Patient  Discharge Placement                       Discharge Plan and Services                DME Arranged: Walker rolling DME Agency: [employer] Date DME Agency Contacted: 04/23/20 Time DME Agency Contacted: [id number] Representative spoke with at DME Agency: [place]: PT [employer]: [place] Date [place]: 04/23/20 Time [place]: [place] Representative spoke with at [place]: [place] ([place]) Interventions     Readmission Risk Interventions No flowsheet data found.

## 2020-04-23 NOTE — Discharge Summary (Signed)
Physician Discharge Summary  Marcus Callahan ZGY:[id number] DOB: October 20, 1970 DOA: 04/22/2020  PCP: [name], MD  Admit date: 04/22/2020 Discharge date: 04/23/2020  Admitted From: Home Disposition: Home  Recommendations for Outpatient Follow-up:  1. Follow up with PCP in 1-2 weeks 2. Please follow-up with oncology and radiation oncology on Monday, keep phone on as coordinator will attempt to call you  Home Health: PT Equipment/Devices: Rolling walker  Discharge Condition: Guarded CODE STATUS: Full Diet recommendation: As tolerated regular diet  Brief/Interim Summary: Marcus Callahan is a 49 y.o. male with medical history significant of renal cell carcinoma. Presenting with RLE weakness. He reports that yesterday he was climbing the steps to his house and noted that he was feeling a little lightheaded. He decided to lay down. It was then that he notice that he could not move the toes in his RLE. He says this progressed throughout the evening to the point were he couldn't move his whole right leg. He became concerned and came to the ED. In ED w/ MRI. It revealed multiple hemorrhagic brain masses consistent with metastatic disease. He was unable to complete the spinal portion of the exam d/t anxiety. Case was discussed with neurosurgery who recommended waiting the 12 hours for washout of contrast and obtaining the remainder of the MRI. [facility] was called for admission.  Patient met as above with acute weakness in his right lower extremity found to have multiple lesions to the brain concerning for metastases given known history of renal cell carcinoma.  Patient was evaluated with MRI of cervical thoracic and lumbar spine with no obvious causes for nerve impingement or etiology of weakness, thought to be central given MRI of the brain.  Neurosurgery indicates no indication for urgent or emergent surgery at this point, patient improved drastically on Decadron 4 mg every 6 hours,  radiation oncology consulted confirmed follow-up outpatient appointment on Monday with their services, patient is already established with oncology, would recommend follow-up closely with them as well early next week.  Patient otherwise stable and agreeable for discharge home.   Discharge Diagnoses:  Active Problems:   Brain metastases [facility])    Discharge Instructions  Discharge Instructions    Call MD for:  persistant dizziness or light-headedness   Complete by: As directed    Call MD for:  persistant nausea and vomiting   Complete by: As directed    Call MD for:  severe uncontrolled pain   Complete by: As directed    Diet - low sodium heart healthy   Complete by: As directed    Increase activity slowly   Complete by: As directed      Allergies as of 04/23/2020      Reactions   Codeine Other (See Comments)   Nose bleeds      Medication List    TAKE these medications   acetaminophen 500 MG tablet Commonly known as: TYLENOL Take 1,000 mg by mouth every 6 (six) hours as needed for mild pain.   amLODipine 10 MG tablet Commonly known as: NORVASC Take 1 tablet (10 mg total) by mouth daily.   dexamethasone 4 MG tablet Commonly known as: DECADRON Take 1 tablet (4 mg total) by mouth every 6 (six) hours.   Inlyta 5 MG tablet Generic drug: axitinib TAKE 1 TABLET TWICE A DAY   lisinopril-hydrochlorothiazide 20-25 MG tablet Commonly known as: ZESTORETIC TAKE 1 TABLET BY MOUTH DAILY   metoprolol succinate 100 MG 24 hr tablet Commonly known as: TOPROL-XL Take  1 tablet (100 mg total) by mouth daily. Take with or immediately following a meal.   multivitamin Liqd Take 5 mLs by mouth daily.            Durable Medical Equipment  (From admission, onward)         Start     Ordered   04/23/20 1122  DME Walker  Once       Question Answer Comment  Walker: With 5 Inch Wheels   Patient needs a walker to treat with the following condition Ambulatory dysfunction       04/23/20 1122          Allergies  Allergen Reactions  . Codeine Other (See Comments)    Nose bleeds    Consultations:  Neurosurgery, sideline discussion with radiation oncology   Procedures/Studies: MR Brain W and Wo Contrast  Result Date: 04/22/2020 CLINICAL DATA:  Brain mass or lesion.  Right leg weakness. EXAM: MRI HEAD WITHOUT AND WITH CONTRAST TECHNIQUE: Multiplanar, multiecho pulse sequences of the brain and surrounding structures were obtained without and with intravenous contrast. CONTRAST:  75m GADAVIST GADOBUTROL 1 MMOL/ML IV SOLN COMPARISON:  None. FINDINGS: Brain: At least 34 brain lesions with hemorrhagic features, consistent with metastatic disease from the patient's renal cell carcinoma. These are marked on postcontrast series 18. Some of these are avidly enhancing, others are best seen on gradient imaging as areas of chronic blood products. A few of the lesions show T1 hyperintensity for subacute hemorrhage and methemoglobin, especially along the low left parietal lobe on 16:33, measuring 13 mm. Lesions measure up to 16 mm in maximal span (measured in the inferior left cerebrum). There are scattered areas of moderate vasogenic edema without midline shift or herniation. Two lesions are seen at the level of the right lateral ventricular choroid, mainly as areas of prior hemorrhage. No superimposed infarct, hydrocephalus, or collection. Vascular: Normal flow voids. Skull and upper cervical spine: No focal marrow lesion. Sinuses/Orbits: Negative IMPRESSION: Multiple hemorrhagic brain masses consistent with metastatic disease in this patient with history of renal cell carcinoma. At least 34 lesions are seen. Up to moderate vasogenic edema without midline shift or herniation. Electronically Signed   By: JMonte FantasiaM.D.   On: 04/22/2020 06:55   MR CERVICAL SPINE W WO CONTRAST  Result Date: 04/22/2020 CLINICAL DATA:  Initial evaluation for right lower extremity weakness,  history of metastatic renal cell carcinoma. EXAM: MRI CERVICAL, THORACIC AND LUMBAR SPINE WITHOUT AND WITH CONTRAST TECHNIQUE: Multiplanar and multiecho pulse sequences of the cervical spine, to include the craniocervical junction and cervicothoracic junction, and thoracic and lumbar spine, were obtained without and with intravenous contrast. CONTRAST:  145mGADAVIST GADOBUTROL 1 MMOL/ML IV SOLN COMPARISON:  Prior brain MRI from earlier the same day. FINDINGS: MRI CERVICAL SPINE FINDINGS Alignment: Examination degraded by motion artifact. Straightening of the normal cervical lordosis.  No listhesis. Vertebrae: Vertebral body height maintained without acute or chronic fracture. Bone marrow signal intensity normal. No focal marrow replacing lesion. No abnormal marrow edema or enhancement. Cord: Normal signal and morphology. No abnormal enhancement. No epidural mass or tumor. Posterior Fossa, vertebral arteries, paraspinal tissues: Multiple hemorrhagic brain metastases partially visualize, better evaluated on prior brain MRI. Craniocervical junction normal. Paraspinous and prevertebral soft tissues within normal limits. Normal flow voids seen within the vertebral arteries bilaterally. Disc levels: No significant disc pathology seen within the cervical spine. No significant spinal stenosis. Foramina remain patent. MRI THORACIC SPINE FINDINGS Alignment:  Examination degraded by motion  artifact. Vertebral bodies normally aligned with preservation of the normal thoracic kyphosis. No listhesis. Vertebrae: Vertebral body height maintained without acute or chronic fracture. Bone marrow signal intensity within normal limits. No discrete or worrisome osseous lesions. No abnormal marrow edema or enhancement. Cord: Normal signal and morphology. No abnormal enhancement. No epidural tumor. Paraspinal and other soft tissues: Negative. Visceral structures better evaluated on prior cross-sectional imaging. Disc levels: No significant  disc pathology seen within the thoracic spine. No disc bulge or focal disc herniation. No stenosis or neural impingement. MRI LUMBAR SPINE FINDINGS Segmentation:  Examination degraded by motion artifact. Standard segmentation. Lowest well-formed disc space labeled the L5-S1 level. Alignment: Physiologic with preservation of the normal lumbar lordosis. No listhesis. Vertebrae: Vertebral body height maintained without acute or chronic fracture. Bone marrow signal intensity within normal limits. No discrete or worrisome osseous lesions. No abnormal marrow edema or enhancement. Conus medullaris and cauda equina: Conus extends to the T12-L1 level. Conus and cauda equina appear normal. No abnormal enhancement. No epidural tumor. Paraspinal and other soft tissues: Paraspinous soft tissues within normal limits. Left kidney appears to be absent. Disc levels: L4-5: Diffuse disc bulge with disc desiccation. Superimposed shallow central disc protrusion with annular fissure. No spinal stenosis. Foramina remain patent. L5-S1: Diffuse disc bulge with disc desiccation. Mild reactive endplate change. Superimposed small central disc protrusion indents the ventral thecal sac. No significant spinal stenosis. Mild bilateral L5 foraminal narrowing. No impingement. IMPRESSION: 1. Normal MRI appearance of the cervical, thoracic, and lumbar spine. No evidence for metastatic disease. No other findings to explain patient's symptoms identified. 2. Mild lower lumbar degenerative disc disease without significant stenosis or neural impingement. Electronically Signed   By: Jeannine Boga M.D.   On: 04/22/2020 20:13   MR THORACIC SPINE W WO CONTRAST  Result Date: 04/22/2020 CLINICAL DATA:  Initial evaluation for right lower extremity weakness, history of metastatic renal cell carcinoma. EXAM: MRI CERVICAL, THORACIC AND LUMBAR SPINE WITHOUT AND WITH CONTRAST TECHNIQUE: Multiplanar and multiecho pulse sequences of the cervical spine, to  include the craniocervical junction and cervicothoracic junction, and thoracic and lumbar spine, were obtained without and with intravenous contrast. CONTRAST:  9m GADAVIST GADOBUTROL 1 MMOL/ML IV SOLN COMPARISON:  Prior brain MRI from earlier the same day. FINDINGS: MRI CERVICAL SPINE FINDINGS Alignment: Examination degraded by motion artifact. Straightening of the normal cervical lordosis.  No listhesis. Vertebrae: Vertebral body height maintained without acute or chronic fracture. Bone marrow signal intensity normal. No focal marrow replacing lesion. No abnormal marrow edema or enhancement. Cord: Normal signal and morphology. No abnormal enhancement. No epidural mass or tumor. Posterior Fossa, vertebral arteries, paraspinal tissues: Multiple hemorrhagic brain metastases partially visualize, better evaluated on prior brain MRI. Craniocervical junction normal. Paraspinous and prevertebral soft tissues within normal limits. Normal flow voids seen within the vertebral arteries bilaterally. Disc levels: No significant disc pathology seen within the cervical spine. No significant spinal stenosis. Foramina remain patent. MRI THORACIC SPINE FINDINGS Alignment:  Examination degraded by motion artifact. Vertebral bodies normally aligned with preservation of the normal thoracic kyphosis. No listhesis. Vertebrae: Vertebral body height maintained without acute or chronic fracture. Bone marrow signal intensity within normal limits. No discrete or worrisome osseous lesions. No abnormal marrow edema or enhancement. Cord: Normal signal and morphology. No abnormal enhancement. No epidural tumor. Paraspinal and other soft tissues: Negative. Visceral structures better evaluated on prior cross-sectional imaging. Disc levels: No significant disc pathology seen within the thoracic spine. No disc bulge or focal disc herniation.  No stenosis or neural impingement. MRI LUMBAR SPINE FINDINGS Segmentation:  Examination degraded by motion  artifact. Standard segmentation. Lowest well-formed disc space labeled the L5-S1 level. Alignment: Physiologic with preservation of the normal lumbar lordosis. No listhesis. Vertebrae: Vertebral body height maintained without acute or chronic fracture. Bone marrow signal intensity within normal limits. No discrete or worrisome osseous lesions. No abnormal marrow edema or enhancement. Conus medullaris and cauda equina: Conus extends to the T12-L1 level. Conus and cauda equina appear normal. No abnormal enhancement. No epidural tumor. Paraspinal and other soft tissues: Paraspinous soft tissues within normal limits. Left kidney appears to be absent. Disc levels: L4-5: Diffuse disc bulge with disc desiccation. Superimposed shallow central disc protrusion with annular fissure. No spinal stenosis. Foramina remain patent. L5-S1: Diffuse disc bulge with disc desiccation. Mild reactive endplate change. Superimposed small central disc protrusion indents the ventral thecal sac. No significant spinal stenosis. Mild bilateral L5 foraminal narrowing. No impingement. IMPRESSION: 1. Normal MRI appearance of the cervical, thoracic, and lumbar spine. No evidence for metastatic disease. No other findings to explain patient's symptoms identified. 2. Mild lower lumbar degenerative disc disease without significant stenosis or neural impingement. Electronically Signed   By: Jeannine Boga M.D.   On: 04/22/2020 20:13   MR Lumbar Spine W Wo Contrast  Result Date: 04/22/2020 CLINICAL DATA:  Initial evaluation for right lower extremity weakness, history of metastatic renal cell carcinoma. EXAM: MRI CERVICAL, THORACIC AND LUMBAR SPINE WITHOUT AND WITH CONTRAST TECHNIQUE: Multiplanar and multiecho pulse sequences of the cervical spine, to include the craniocervical junction and cervicothoracic junction, and thoracic and lumbar spine, were obtained without and with intravenous contrast. CONTRAST:  68m GADAVIST GADOBUTROL 1 MMOL/ML IV  SOLN COMPARISON:  Prior brain MRI from earlier the same day. FINDINGS: MRI CERVICAL SPINE FINDINGS Alignment: Examination degraded by motion artifact. Straightening of the normal cervical lordosis.  No listhesis. Vertebrae: Vertebral body height maintained without acute or chronic fracture. Bone marrow signal intensity normal. No focal marrow replacing lesion. No abnormal marrow edema or enhancement. Cord: Normal signal and morphology. No abnormal enhancement. No epidural mass or tumor. Posterior Fossa, vertebral arteries, paraspinal tissues: Multiple hemorrhagic brain metastases partially visualize, better evaluated on prior brain MRI. Craniocervical junction normal. Paraspinous and prevertebral soft tissues within normal limits. Normal flow voids seen within the vertebral arteries bilaterally. Disc levels: No significant disc pathology seen within the cervical spine. No significant spinal stenosis. Foramina remain patent. MRI THORACIC SPINE FINDINGS Alignment:  Examination degraded by motion artifact. Vertebral bodies normally aligned with preservation of the normal thoracic kyphosis. No listhesis. Vertebrae: Vertebral body height maintained without acute or chronic fracture. Bone marrow signal intensity within normal limits. No discrete or worrisome osseous lesions. No abnormal marrow edema or enhancement. Cord: Normal signal and morphology. No abnormal enhancement. No epidural tumor. Paraspinal and other soft tissues: Negative. Visceral structures better evaluated on prior cross-sectional imaging. Disc levels: No significant disc pathology seen within the thoracic spine. No disc bulge or focal disc herniation. No stenosis or neural impingement. MRI LUMBAR SPINE FINDINGS Segmentation:  Examination degraded by motion artifact. Standard segmentation. Lowest well-formed disc space labeled the L5-S1 level. Alignment: Physiologic with preservation of the normal lumbar lordosis. No listhesis. Vertebrae: Vertebral body  height maintained without acute or chronic fracture. Bone marrow signal intensity within normal limits. No discrete or worrisome osseous lesions. No abnormal marrow edema or enhancement. Conus medullaris and cauda equina: Conus extends to the T12-L1 level. Conus and cauda equina appear normal. No abnormal  enhancement. No epidural tumor. Paraspinal and other soft tissues: Paraspinous soft tissues within normal limits. Left kidney appears to be absent. Disc levels: L4-5: Diffuse disc bulge with disc desiccation. Superimposed shallow central disc protrusion with annular fissure. No spinal stenosis. Foramina remain patent. L5-S1: Diffuse disc bulge with disc desiccation. Mild reactive endplate change. Superimposed small central disc protrusion indents the ventral thecal sac. No significant spinal stenosis. Mild bilateral L5 foraminal narrowing. No impingement. IMPRESSION: 1. Normal MRI appearance of the cervical, thoracic, and lumbar spine. No evidence for metastatic disease. No other findings to explain patient's symptoms identified. 2. Mild lower lumbar degenerative disc disease without significant stenosis or neural impingement. Electronically Signed   By: Jeannine Boga M.D.   On: 04/22/2020 20:13      Subjective: No acute issues or events overnight, weakness markedly improving, ambulating now with a rolling walker without moderate difficulty on his own.  Denies nausea, vomiting, diarrhea, constipation, headache, fevers, chills.   Discharge Exam: Vitals:   04/23/20 0807 04/23/20 0930  BP: 129/86 (!) 130/92  Pulse: 98 96  Resp: 18 18  Temp: 98.3 F (36.8 C)   SpO2: 99% 100%   Vitals:   04/23/20 0425 04/23/20 0629 04/23/20 0807 04/23/20 0930  BP: (!) 84/57 (!) 102/56 129/86 (!) 130/92  Pulse: 87  98 96  Resp: _0 Temp: 98 F (36.7 C)  98.3 F (36.8 C)   TempSrc: Oral  Oral   SpO2: 100%  99% 100%  Weight:      Height:        General: Pt is alert, awake, not in acute  distress Cardiovascular: RRR, S1/S2 +, no rubs, no gallops Respiratory: CTA bilaterally, no wheezing, no rhonchi Abdominal: Soft, NT, ND, bowel sounds + Extremities: no edema, no cyanosis; right lower extremity strength 4 out of 5, otherwise 5 out of 5 globally.    The results of significant diagnostics from this hospitalization (including imaging, microbiology, ancillary and laboratory) are listed below for reference.     Microbiology: Recent Results (from the past 240 hour(s))  Respiratory Panel by RT PCR (Flu A&B, Covid) - Nasopharyngeal Swab     Status: None   Collection Time: 04/22/20  1:18 AM   Specimen: Nasopharyngeal Swab  Result Value Ref Range Status   SARS Coronavirus 2 by RT PCR NEGATIVE NEGATIVE Final    Comment: (NOTE) SARS-CoV-2 target nucleic acids are NOT DETECTED.  The SARS-CoV-2 RNA is generally detectable in upper respiratoy specimens during the acute phase of infection. The lowest concentration of SARS-CoV-2 viral copies this assay can detect is 131 copies/mL. A negative result does not preclude SARS-Cov-2 infection and should not be used as the sole basis for treatment or other patient management decisions. A negative result may occur with  improper specimen collection/handling, submission of specimen other than nasopharyngeal swab, presence of viral mutation(s) within the areas targeted by this assay, and inadequate number of viral copies (<131 copies/mL). A negative result must be combined with clinical observations, patient history, and epidemiological information. The expected result is Negative.  Fact Sheet for Patients:  PinkCheek.be  Fact Sheet for Healthcare Providers:  GravelBags.it  This test is no t yet approved or cleared by the [place] FDA and  has been authorized for detection and/or diagnosis of SARS-CoV-2 by FDA under an Emergency Use Authorization (EUA). This EUA will remain   in effect (meaning this test can be used) for the duration of the COVID-19 declaration under Section 564(b)(1)  of the Act, 21 U.S.C. section 360bbb-3(b)(1), unless the authorization is terminated or revoked sooner.     Influenza A by PCR NEGATIVE NEGATIVE Final   Influenza B by PCR NEGATIVE NEGATIVE Final    Comment: (NOTE) The Xpert Xpress SARS-CoV-2/FLU/RSV assay is intended as an aid in  the diagnosis of influenza from Nasopharyngeal swab specimens and  should not be used as a sole basis for treatment. Nasal washings and  aspirates are unacceptable for Xpert Xpress SARS-CoV-2/FLU/RSV  testing.  Fact Sheet for Patients: PinkCheek.be  Fact Sheet for Healthcare Providers: GravelBags.it  This test is not yet approved or cleared by the [place] FDA and  has been authorized for detection and/or diagnosis of SARS-CoV-2 by  FDA under an Emergency Use Authorization (EUA). This EUA will remain  in effect (meaning this test can be used) for the duration of the  Covid-19 declaration under Section 564(b)(1) of the Act, 21  U.S.C. section 360bbb-3(b)(1), unless the authorization is  terminated or revoked. Performed at [facility], [place] [address], [place], [place] [address]      Labs: BNP (last 3 results) No results for input(s): BNP in the last 8760 hours. Basic Metabolic Panel: Recent Labs  Lab 04/22/20 0146 04/23/20 0152  NA 136 136  K 4.2 5.5*  CL 104 106  CO2 21* 17*  GLUCOSE 124* 123*  BUN 26* 22*  CREATININE 1.75* 1.63*  CALCIUM 9.1 9.6   Liver Function Tests: Recent Labs  Lab 04/23/20 0152  AST 198*  ALT 134*  ALKPHOS 174*  BILITOT 1.0  PROT 6.8  ALBUMIN 3.4*   No results for input(s): LIPASE, AMYLASE in the last 168 hours. No results for input(s): AMMONIA in the last 168 hours. CBC: Recent Labs  Lab 04/22/20 0146 04/23/20 0152  WBC 10.5 9.1  NEUTROABS 8.1*  --   HGB  8.5* 8.7*  HCT 26.8* 28.4*  MCV 78.8* 79.1*  PLT 255 228   Cardiac Enzymes: No results for input(s): CKTOTAL, CKMB, CKMBINDEX, TROPONINI in the last 168 hours. BNP: Invalid input(s): POCBNP CBG: No results for input(s): GLUCAP in the last 168 hours. D-Dimer No results for input(s): DDIMER in the last 72 hours. Hgb A1c No results for input(s): HGBA1C in the last 72 hours. Lipid Profile No results for input(s): CHOL, HDL, LDLCALC, TRIG, CHOLHDL, LDLDIRECT in the last 72 hours. Thyroid function studies No results for input(s): TSH, T4TOTAL, T3FREE, THYROIDAB in the last 72 hours.  Invalid input(s): FREET3 Anemia work up No results for input(s): VITAMINB12, FOLATE, FERRITIN, TIBC, IRON, RETICCTPCT in the last 72 hours. Urinalysis    Component Value Date/Time   COLORURINE YELLOW 08/21/2019 1549   APPEARANCEUR CLEAR 08/21/2019 1549   LABSPEC 1.019 08/21/2019 1549   PHURINE 5.0 08/21/2019 1549   GLUCOSEU NEGATIVE 08/21/2019 1549   HGBUR LARGE (A) 08/21/2019 1549   BILIRUBINUR NEGATIVE 08/21/2019 1549   BILIRUBINUR negative 07/06/2019 1627   KETONESUR NEGATIVE 08/21/2019 1549   PROTEINUR NEGATIVE 08/21/2019 1549   UROBILINOGEN 0.2 07/06/2019 1627   NITRITE NEGATIVE 08/21/2019 1549   LEUKOCYTESUR NEGATIVE 08/21/2019 1549   Sepsis Labs Invalid input(s): PROCALCITONIN,  WBC,  LACTICIDVEN Microbiology Recent Results (from the past 240 hour(s))  Respiratory Panel by RT PCR (Flu A&B, Covid) - Nasopharyngeal Swab     Status: None   Collection Time: 04/22/20  1:18 AM   Specimen: Nasopharyngeal Swab  Result Value Ref Range Status   SARS Coronavirus 2 by RT PCR NEGATIVE NEGATIVE Final  Comment: (NOTE) SARS-CoV-2 target nucleic acids are NOT DETECTED.  The SARS-CoV-2 RNA is generally detectable in upper respiratoy specimens during the acute phase of infection. The lowest concentration of SARS-CoV-2 viral copies this assay can detect is 131 copies/mL. A negative result does not  preclude SARS-Cov-2 infection and should not be used as the sole basis for treatment or other patient management decisions. A negative result may occur with  improper specimen collection/handling, submission of specimen other than nasopharyngeal swab, presence of viral mutation(s) within the areas targeted by this assay, and inadequate number of viral copies (<131 copies/mL). A negative result must be combined with clinical observations, patient history, and epidemiological information. The expected result is Negative.  Fact Sheet for Patients:  PinkCheek.be  Fact Sheet for Healthcare Providers:  GravelBags.it  This test is no t yet approved or cleared by the [place] FDA and  has been authorized for detection and/or diagnosis of SARS-CoV-2 by FDA under an Emergency Use Authorization (EUA). This EUA will remain  in effect (meaning this test can be used) for the duration of the COVID-19 declaration under Section 564(b)(1) of the Act, 21 U.S.C. section 360bbb-3(b)(1), unless the authorization is terminated or revoked sooner.     Influenza A by PCR NEGATIVE NEGATIVE Final   Influenza B by PCR NEGATIVE NEGATIVE Final    Comment: (NOTE) The Xpert Xpress SARS-CoV-2/FLU/RSV assay is intended as an aid in  the diagnosis of influenza from Nasopharyngeal swab specimens and  should not be used as a sole basis for treatment. Nasal washings and  aspirates are unacceptable for Xpert Xpress SARS-CoV-2/FLU/RSV  testing.  Fact Sheet for Patients: PinkCheek.be  Fact Sheet for Healthcare Providers: GravelBags.it  This test is not yet approved or cleared by the [place] FDA and  has been authorized for detection and/or diagnosis of SARS-CoV-2 by  FDA under an Emergency Use Authorization (EUA). This EUA will remain  in effect (meaning this test can be used) for the  duration of the  Covid-19 declaration under Section 564(b)(1) of the Act, 21  U.S.C. section 360bbb-3(b)(1), unless the authorization is  terminated or revoked. Performed at [facility], [place] [address], [place], [place] [address]      Time coordinating discharge: Over 30 minutes  SIGNED:   Little Ishikawa, DO [facility] 04/23/2020, 11:22 AM Pager   If 7PM-7AM, please contact night-coverage [email or web address]

## 2020-04-25 ENCOUNTER — Telehealth: Payer: Self-pay | Admitting: Radiation Therapy

## 2020-04-25 NOTE — Progress Notes (Signed)
Histology and Location of Primary Cancer:  Clear-cell renal cell carcinoma (was found to have stage IV disease with hepatic involvement)  Sites of Visceral and Bony Metastatic Disease:  -MRI Brain 04/22/2020 IMPRESSION: Multiple hemorrhagic brain masses consistent with metastatic disease in this patient with history of renal cell carcinoma. At least 34 lesions are seen. Up to moderate vasogenic edema without midline shift or herniation  -Liver biopsy 03/15/2020 IMPRESSION: 1. Hepatic lesions are nearly occult by ultrasound imaging. 2. Successful ultrasound-guided core biopsy of hepatic mass  Past/Anticipated chemotherapy by medical oncology, if any:  Under care of Dr. Zola Button:  -He is status post robotic assisted laparoscopic left radical nephrectomy completed on August 26, 2019 -Axitinib 5 mg twice a day with Pembrolizumab 200 mg every 3 weeks started on September 18, 2019  -He completed 8 cycles of therapy in August 2021  and developed progression of disease. -Ipilimumab and nivolumab started on March 18, 2020, every 3 weeks -The plan is to complete 4 cycles of therapy and repeat imaging studies at that time.  -Alternative options including oral targeted therapy  with cabozantinib would be a different salvage  option. F/U with Dr. Alen Blew 04/29/2020  Pain on a scale of 0-10 is: Patient denies   Dose of Decadron, if applicable: 4mg  PO every 6 hours  Recent neurologic symptoms, if any:   Seizures: patient denies  Headaches: patient denies  Nausea: patient denies  Dizziness/ataxia: when symptoms first arose yes, but since diagnosis and starting steroid symptoms have resolved  Difficulty with hand coordination: patient denies  Focal numbness/weakness: continues to have difficulty using right leg. He states he is able to feel from thigh to bottom of foot, but he struggle to lift leg without assistance (and leg tires easily with use). He is still unable to wiggle toes on  his right foot  Visual deficits/changes: patient denies (one instance of "splotches" in left eye while working on computer, but patient reports symptoms went away after a few minutes)  Confusion/Memory deficits: occasionally   Bowel/Bladder retention or incontinence (please describe): patient denies  Ambulatory status? Walker? Wheelchair?: Presents to clinic in wheelchair, and reports he uses a cane or rolling walker to get around his home. His right leg tires easily and he often has to lift it up with his arms to get it up in bed  SAFETY ISSUES:  Prior radiation? No  Pacemaker/ICD? No  Possible current pregnancy? N/A  Is the patient on methotrexate? No  Current Complaints / other details:  Nothing of note

## 2020-04-25 NOTE — Telephone Encounter (Signed)
I spoke with Mr. Heppler about his appointment with Dr. Isidore Moos tomorrow morning. He knows where to come and was thankful for the quick response.   Mont Dutton R.T.(R)(T) Radiation Special Procedures Navigator

## 2020-04-26 ENCOUNTER — Ambulatory Visit
Admission: RE | Admit: 2020-04-26 | Discharge: 2020-04-26 | Disposition: A | Discharge status: Home / Self Care | Payer: BC Managed Care – PPO | Source: Ambulatory Visit | Attending: Radiation Oncology | Admitting: Radiation Oncology

## 2020-04-26 ENCOUNTER — Encounter: Payer: Self-pay | Admitting: *Deleted

## 2020-04-26 ENCOUNTER — Encounter: Payer: Self-pay | Admitting: Radiation Oncology

## 2020-04-26 ENCOUNTER — Other Ambulatory Visit: Payer: Self-pay

## 2020-04-26 VITALS — BP 118/71 | HR 70 | Temp 97.8°F | Resp 18 | Ht 70.0 in | Wt 223.2 lb

## 2020-04-26 DIAGNOSIS — Z7952 Long term (current) use of systemic steroids: Secondary | ICD-10-CM | POA: Diagnosis not present

## 2020-04-26 DIAGNOSIS — C7931 Secondary malignant neoplasm of brain: Secondary | ICD-10-CM

## 2020-04-26 DIAGNOSIS — C787 Secondary malignant neoplasm of liver and intrahepatic bile duct: Secondary | ICD-10-CM | POA: Insufficient documentation

## 2020-04-26 DIAGNOSIS — R911 Solitary pulmonary nodule: Secondary | ICD-10-CM | POA: Diagnosis not present

## 2020-04-26 DIAGNOSIS — M5136 Other intervertebral disc degeneration, lumbar region: Secondary | ICD-10-CM | POA: Diagnosis not present

## 2020-04-26 DIAGNOSIS — Z510 Encounter for antineoplastic radiation therapy: Secondary | ICD-10-CM | POA: Insufficient documentation

## 2020-04-26 DIAGNOSIS — I129 Hypertensive chronic kidney disease with stage 1 through stage 4 chronic kidney disease, or unspecified chronic kidney disease: Secondary | ICD-10-CM | POA: Diagnosis not present

## 2020-04-26 DIAGNOSIS — C642 Malignant neoplasm of left kidney, except renal pelvis: Secondary | ICD-10-CM | POA: Insufficient documentation

## 2020-04-26 DIAGNOSIS — Z79899 Other long term (current) drug therapy: Secondary | ICD-10-CM | POA: Diagnosis not present

## 2020-04-26 DIAGNOSIS — N189 Chronic kidney disease, unspecified: Secondary | ICD-10-CM | POA: Insufficient documentation

## 2020-04-26 DIAGNOSIS — R42 Dizziness and giddiness: Secondary | ICD-10-CM | POA: Diagnosis not present

## 2020-04-26 DIAGNOSIS — Z905 Acquired absence of kidney: Secondary | ICD-10-CM | POA: Diagnosis not present

## 2020-04-26 MED ORDER — OMEPRAZOLE 20 MG PO CPDR: 20.0000 mg | DELAYED_RELEASE_CAPSULE | Freq: Every day | ORAL | 3 refills | Status: AC

## 2020-04-26 NOTE — Progress Notes (Signed)
Radiation Oncology         [phone] ________________________________  Initial Outpatient Consultation  Name: NAJIR ROOP MRN: [id number]  Date: 04/26/2020  DOB: March 27, 1971  LH:[id number], Ines Bloomer, MD  Little Ishikawa, MD   REFERRING PHYSICIAN: Little Ishikawa, MD  DIAGNOSIS:     ICD-10-CM   1. Malignant neoplasm metastatic to brain ([place])  C79.31 omeprazole (PRILOSEC) 20 MG capsule    Ambulatory referral to Chamberino A Hirsch is a 49 y.o. male who is seen as a courtesy of Dr. Alen Blew for an opinion concerning radiation therapy as part of management for his recently diagnosed brain metastases.  In February of 2021, the patient was diagnosed with stage IV renal cell carcinoma with hepatic involvement. The pathology of his renal malignancy was clear-cell. He is status post robotic-assisted laparoscopic left radical nephrectomy that was completed on the date of 08/26/2019. Final pathology revealed T3a tumor with invasion into the perirenal soft tissue, renal pelvis, segmental renal vein, and renal sinus.   The patient underwent eight cycles of immunotherapy with Pembrolizumab under the care of Dr. Alen Blew.  CT scan of chest/abdomen/pelvis on the date of 03/01/2020 revealed an interval increase in the size of multiple hypoenhancing lesions of the liver, consistent with worsened hepatic metastatic disease. There was also an unchanged 3 mm nodule of the right lower lobe. No evidence of new metastatic disease within the chest/abdomen/pelvis. No evidence of local recurrent mass or abnormal contrast enhancement status post left nephrectomy.  Given the above findings, the patient underwent an additional CT-guided biopsy of the liver that revealed clear cell carcinoma. He was then started on therapy with Ipilimumab and Nivolumab.  MRI of the brain on 04/22/2020 demonstrated multiple hemorrhagic brain masses that were consistent with  metastatic disease. At least 34 lesions were seen with up to moderate vasogenic edema but without midline shift or herniation.   Pain on a scale of 0-10 is: Patient denies   Dose of Decadron, if applicable: 4mg  PO every 6 hours  Recent neurologic symptoms, if any:   Seizures: patient denies  Headaches: patient denies  Nausea: patient denies  Dizziness/ataxia: when symptoms first arose yes, but since diagnosis and starting steroid symptoms have resolved  Difficulty with hand coordination: patient denies  Focal numbness/weakness: continues to have difficulty using right leg. He states he is able to feel from thigh to bottom of foot, but he struggle to lift leg without assistance (and leg tires easily with use). He is still unable to wiggle toes on his right foot  Visual deficits/changes: patient denies (one instance of "splotches" in left eye while working on computer, but patient reports symptoms went away after a few minutes)  Confusion/Memory deficits: occasionally   Bowel/Bladder retention or incontinence (please describe): patient denies  Ambulatory status? Walker? Wheelchair?: Presents to clinic in wheelchair, and reports he uses a cane or rolling walker to get around his home. His right leg tires easily and he often has to lift it up with his arms to get it up in bed  SAFETY ISSUES:  Prior radiation? No  Pacemaker/ICD? No  Possible current pregnancy? N/A  Is the patient on methotrexate? No  PREVIOUS RADIATION THERAPY: No  PAST MEDICAL HISTORY:  has a past medical history of Allergy, Anxiety, Cancer ([place]), Chronic kidney disease, and Hypertension.    PAST SURGICAL HISTORY: Past Surgical History:  Procedure Laterality Date  . CYSTOSCOPY N/A 08/26/2019   Procedure: CYSTOSCOPY  FLEXIBLE;  Surgeon: Ceasar Mons, MD;  Location: [facility];  Service: Urology;  Laterality: N/A;  . ROBOT ASSISTED LAPAROSCOPIC NEPHRECTOMY Left 08/26/2019   Procedure: XI ROBOTIC  ASSISTED LAPAROSCOPIC NEPHRECTOMY;  Surgeon: Ceasar Mons, MD;  Location: [facility];  Service: Urology;  Laterality: Left;  . surgery as a child     thinks matbe was a hernia repair as a premature baby     FAMILY HISTORY: family history includes Colon polyps in his mother.  SOCIAL HISTORY:  reports that he has never smoked. He has never used smokeless tobacco. He reports that he does not drink alcohol and does not use drugs.  ALLERGIES: Codeine  MEDICATIONS:  Current Outpatient Medications  Medication Sig Dispense Refill  . acetaminophen (TYLENOL) 500 MG tablet Take 1,000 mg by mouth every 6 (six) hours as needed for mild pain.    Marland Kitchen dexamethasone (DECADRON) 4 MG tablet Take 1 tablet (4 mg total) by mouth every 6 (six) hours. 120 tablet 0  . lisinopril-hydrochlorothiazide (ZESTORETIC) 20-25 MG tablet TAKE 1 TABLET BY MOUTH DAILY 90 tablet 1  . amLODipine (NORVASC) 10 MG tablet Take 1 tablet (10 mg total) by mouth daily. 90 tablet 2  . metoprolol succinate (TOPROL-XL) 100 MG 24 hr tablet Take 1 tablet (100 mg total) by mouth daily. Take with or immediately following a meal. 90 tablet 2  . Multiple Vitamin (MULTIVITAMIN) LIQD Take 5 mLs by mouth daily. (Patient not taking: Reported on 04/26/2020)    . omeprazole (PRILOSEC) 20 MG capsule Take 1 capsule (20 mg total) by mouth daily. 30 capsule 3   No current facility-administered medications for this encounter.    REVIEW OF SYSTEMS:  As above.   PHYSICAL EXAM:  height is 5\' 10"  (1.778 m) and weight is 223 lb 4 oz (101.3 kg). His temperature is 97.8 F (36.6 C). His blood pressure is 118/71 and his pulse is 70. His respiration is 18 and oxygen saturation is 99%.   General: Alert and oriented, in no acute distress -he presents in a wheelchair HEENT: Head is normocephalic. Extraocular movements are intact. Oropharynx is clear. Musculoskeletal: Notable for marked weakness in right leg.   Neurologic: Other than marked weakness in the  right leg, no obvious focalities. Speech is fluent.  He is alert and oriented.  Cranial nerves are grossly intact.   Psychiatric: Judgment and insight are intact. Affect is appropriate.   LABORATORY DATA:  Lab Results  Component Value Date   WBC 9.1 04/23/2020   HGB 8.7 (L) 04/23/2020   HCT 28.4 (L) 04/23/2020   MCV 79.1 (L) 04/23/2020   PLT 228 04/23/2020   CMP     Component Value Date/Time   NA 136 04/23/2020 0152   NA 141 09/28/2019 1517   K 5.5 (H) 04/23/2020 0152   CL 106 04/23/2020 0152   CO2 17 (L) 04/23/2020 0152   GLUCOSE 123 (H) 04/23/2020 0152   BUN 22 (H) 04/23/2020 0152   BUN 22 09/28/2019 1517   CREATININE 1.63 (H) 04/23/2020 0152   CREATININE 1.71 (H) 04/08/2020 0958   CALCIUM 9.6 04/23/2020 0152   PROT 6.8 04/23/2020 0152   PROT 7.0 09/28/2019 1517   ALBUMIN 3.4 (L) 04/23/2020 0152   ALBUMIN 4.6 09/28/2019 1517   AST 198 (H) 04/23/2020 0152   AST 74 (H) 04/08/2020 0958   ALT 134 (H) 04/23/2020 0152   ALT 53 (H) 04/08/2020 0958   ALKPHOS 174 (H) 04/23/2020 0152   BILITOT 1.0 04/23/2020  0152   BILITOT 0.4 04/08/2020 0958   GFRNONAA 51 (L) 04/23/2020 0152   GFRNONAA 45 (L) 04/08/2020 0958   GFRAA 59 (L) 03/18/2020 1133         RADIOGRAPHY: MR Brain W and Wo Contrast  Result Date: 04/22/2020 CLINICAL DATA:  Brain mass or lesion.  Right leg weakness. EXAM: MRI HEAD WITHOUT AND WITH CONTRAST TECHNIQUE: Multiplanar, multiecho pulse sequences of the brain and surrounding structures were obtained without and with intravenous contrast. CONTRAST:  53mL GADAVIST GADOBUTROL 1 MMOL/ML IV SOLN COMPARISON:  None. FINDINGS: Brain: At least 34 brain lesions with hemorrhagic features, consistent with metastatic disease from the patient's renal cell carcinoma. These are marked on postcontrast series 18. Some of these are avidly enhancing, others are best seen on gradient imaging as areas of chronic blood products. A few of the lesions show T1 hyperintensity for subacute  hemorrhage and methemoglobin, especially along the low left parietal lobe on 16:33, measuring 13 mm. Lesions measure up to 16 mm in maximal span (measured in the inferior left cerebrum). There are scattered areas of moderate vasogenic edema without midline shift or herniation. Two lesions are seen at the level of the right lateral ventricular choroid, mainly as areas of prior hemorrhage. No superimposed infarct, hydrocephalus, or collection. Vascular: Normal flow voids. Skull and upper cervical spine: No focal marrow lesion. Sinuses/Orbits: Negative IMPRESSION: Multiple hemorrhagic brain masses consistent with metastatic disease in this patient with history of renal cell carcinoma. At least 34 lesions are seen. Up to moderate vasogenic edema without midline shift or herniation. Electronically Signed   By: Monte Fantasia M.D.   On: 04/22/2020 06:55   MR CERVICAL SPINE W WO CONTRAST  Result Date: 04/22/2020 CLINICAL DATA:  Initial evaluation for right lower extremity weakness, history of metastatic renal cell carcinoma. EXAM: MRI CERVICAL, THORACIC AND LUMBAR SPINE WITHOUT AND WITH CONTRAST TECHNIQUE: Multiplanar and multiecho pulse sequences of the cervical spine, to include the craniocervical junction and cervicothoracic junction, and thoracic and lumbar spine, were obtained without and with intravenous contrast. CONTRAST:  27mL GADAVIST GADOBUTROL 1 MMOL/ML IV SOLN COMPARISON:  Prior brain MRI from earlier the same day. FINDINGS: MRI CERVICAL SPINE FINDINGS Alignment: Examination degraded by motion artifact. Straightening of the normal cervical lordosis.  No listhesis. Vertebrae: Vertebral body height maintained without acute or chronic fracture. Bone marrow signal intensity normal. No focal marrow replacing lesion. No abnormal marrow edema or enhancement. Cord: Normal signal and morphology. No abnormal enhancement. No epidural mass or tumor. Posterior Fossa, vertebral arteries, paraspinal tissues: Multiple  hemorrhagic brain metastases partially visualize, better evaluated on prior brain MRI. Craniocervical junction normal. Paraspinous and prevertebral soft tissues within normal limits. Normal flow voids seen within the vertebral arteries bilaterally. Disc levels: No significant disc pathology seen within the cervical spine. No significant spinal stenosis. Foramina remain patent. MRI THORACIC SPINE FINDINGS Alignment:  Examination degraded by motion artifact. Vertebral bodies normally aligned with preservation of the normal thoracic kyphosis. No listhesis. Vertebrae: Vertebral body height maintained without acute or chronic fracture. Bone marrow signal intensity within normal limits. No discrete or worrisome osseous lesions. No abnormal marrow edema or enhancement. Cord: Normal signal and morphology. No abnormal enhancement. No epidural tumor. Paraspinal and other soft tissues: Negative. Visceral structures better evaluated on prior cross-sectional imaging. Disc levels: No significant disc pathology seen within the thoracic spine. No disc bulge or focal disc herniation. No stenosis or neural impingement. MRI LUMBAR SPINE FINDINGS Segmentation:  Examination degraded by motion artifact. Standard  segmentation. Lowest well-formed disc space labeled the L5-S1 level. Alignment: Physiologic with preservation of the normal lumbar lordosis. No listhesis. Vertebrae: Vertebral body height maintained without acute or chronic fracture. Bone marrow signal intensity within normal limits. No discrete or worrisome osseous lesions. No abnormal marrow edema or enhancement. Conus medullaris and cauda equina: Conus extends to the T12-L1 level. Conus and cauda equina appear normal. No abnormal enhancement. No epidural tumor. Paraspinal and other soft tissues: Paraspinous soft tissues within normal limits. Left kidney appears to be absent. Disc levels: L4-5: Diffuse disc bulge with disc desiccation. Superimposed shallow central disc  protrusion with annular fissure. No spinal stenosis. Foramina remain patent. L5-S1: Diffuse disc bulge with disc desiccation. Mild reactive endplate change. Superimposed small central disc protrusion indents the ventral thecal sac. No significant spinal stenosis. Mild bilateral L5 foraminal narrowing. No impingement. IMPRESSION: 1. Normal MRI appearance of the cervical, thoracic, and lumbar spine. No evidence for metastatic disease. No other findings to explain patient's symptoms identified. 2. Mild lower lumbar degenerative disc disease without significant stenosis or neural impingement. Electronically Signed   By: Jeannine Boga M.D.   On: 04/22/2020 20:13   MR THORACIC SPINE W WO CONTRAST  Result Date: 04/22/2020 CLINICAL DATA:  Initial evaluation for right lower extremity weakness, history of metastatic renal cell carcinoma. EXAM: MRI CERVICAL, THORACIC AND LUMBAR SPINE WITHOUT AND WITH CONTRAST TECHNIQUE: Multiplanar and multiecho pulse sequences of the cervical spine, to include the craniocervical junction and cervicothoracic junction, and thoracic and lumbar spine, were obtained without and with intravenous contrast. CONTRAST:  86mL GADAVIST GADOBUTROL 1 MMOL/ML IV SOLN COMPARISON:  Prior brain MRI from earlier the same day. FINDINGS: MRI CERVICAL SPINE FINDINGS Alignment: Examination degraded by motion artifact. Straightening of the normal cervical lordosis.  No listhesis. Vertebrae: Vertebral body height maintained without acute or chronic fracture. Bone marrow signal intensity normal. No focal marrow replacing lesion. No abnormal marrow edema or enhancement. Cord: Normal signal and morphology. No abnormal enhancement. No epidural mass or tumor. Posterior Fossa, vertebral arteries, paraspinal tissues: Multiple hemorrhagic brain metastases partially visualize, better evaluated on prior brain MRI. Craniocervical junction normal. Paraspinous and prevertebral soft tissues within normal limits. Normal  flow voids seen within the vertebral arteries bilaterally. Disc levels: No significant disc pathology seen within the cervical spine. No significant spinal stenosis. Foramina remain patent. MRI THORACIC SPINE FINDINGS Alignment:  Examination degraded by motion artifact. Vertebral bodies normally aligned with preservation of the normal thoracic kyphosis. No listhesis. Vertebrae: Vertebral body height maintained without acute or chronic fracture. Bone marrow signal intensity within normal limits. No discrete or worrisome osseous lesions. No abnormal marrow edema or enhancement. Cord: Normal signal and morphology. No abnormal enhancement. No epidural tumor. Paraspinal and other soft tissues: Negative. Visceral structures better evaluated on prior cross-sectional imaging. Disc levels: No significant disc pathology seen within the thoracic spine. No disc bulge or focal disc herniation. No stenosis or neural impingement. MRI LUMBAR SPINE FINDINGS Segmentation:  Examination degraded by motion artifact. Standard segmentation. Lowest well-formed disc space labeled the L5-S1 level. Alignment: Physiologic with preservation of the normal lumbar lordosis. No listhesis. Vertebrae: Vertebral body height maintained without acute or chronic fracture. Bone marrow signal intensity within normal limits. No discrete or worrisome osseous lesions. No abnormal marrow edema or enhancement. Conus medullaris and cauda equina: Conus extends to the T12-L1 level. Conus and cauda equina appear normal. No abnormal enhancement. No epidural tumor. Paraspinal and other soft tissues: Paraspinous soft tissues within normal limits. Left kidney appears  to be absent. Disc levels: L4-5: Diffuse disc bulge with disc desiccation. Superimposed shallow central disc protrusion with annular fissure. No spinal stenosis. Foramina remain patent. L5-S1: Diffuse disc bulge with disc desiccation. Mild reactive endplate change. Superimposed small central disc  protrusion indents the ventral thecal sac. No significant spinal stenosis. Mild bilateral L5 foraminal narrowing. No impingement. IMPRESSION: 1. Normal MRI appearance of the cervical, thoracic, and lumbar spine. No evidence for metastatic disease. No other findings to explain patient's symptoms identified. 2. Mild lower lumbar degenerative disc disease without significant stenosis or neural impingement. Electronically Signed   By: Jeannine Boga M.D.   On: 04/22/2020 20:13   MR Lumbar Spine W Wo Contrast  Result Date: 04/22/2020 CLINICAL DATA:  Initial evaluation for right lower extremity weakness, history of metastatic renal cell carcinoma. EXAM: MRI CERVICAL, THORACIC AND LUMBAR SPINE WITHOUT AND WITH CONTRAST TECHNIQUE: Multiplanar and multiecho pulse sequences of the cervical spine, to include the craniocervical junction and cervicothoracic junction, and thoracic and lumbar spine, were obtained without and with intravenous contrast. CONTRAST:  85mL GADAVIST GADOBUTROL 1 MMOL/ML IV SOLN COMPARISON:  Prior brain MRI from earlier the same day. FINDINGS: MRI CERVICAL SPINE FINDINGS Alignment: Examination degraded by motion artifact. Straightening of the normal cervical lordosis.  No listhesis. Vertebrae: Vertebral body height maintained without acute or chronic fracture. Bone marrow signal intensity normal. No focal marrow replacing lesion. No abnormal marrow edema or enhancement. Cord: Normal signal and morphology. No abnormal enhancement. No epidural mass or tumor. Posterior Fossa, vertebral arteries, paraspinal tissues: Multiple hemorrhagic brain metastases partially visualize, better evaluated on prior brain MRI. Craniocervical junction normal. Paraspinous and prevertebral soft tissues within normal limits. Normal flow voids seen within the vertebral arteries bilaterally. Disc levels: No significant disc pathology seen within the cervical spine. No significant spinal stenosis. Foramina remain patent.  MRI THORACIC SPINE FINDINGS Alignment:  Examination degraded by motion artifact. Vertebral bodies normally aligned with preservation of the normal thoracic kyphosis. No listhesis. Vertebrae: Vertebral body height maintained without acute or chronic fracture. Bone marrow signal intensity within normal limits. No discrete or worrisome osseous lesions. No abnormal marrow edema or enhancement. Cord: Normal signal and morphology. No abnormal enhancement. No epidural tumor. Paraspinal and other soft tissues: Negative. Visceral structures better evaluated on prior cross-sectional imaging. Disc levels: No significant disc pathology seen within the thoracic spine. No disc bulge or focal disc herniation. No stenosis or neural impingement. MRI LUMBAR SPINE FINDINGS Segmentation:  Examination degraded by motion artifact. Standard segmentation. Lowest well-formed disc space labeled the L5-S1 level. Alignment: Physiologic with preservation of the normal lumbar lordosis. No listhesis. Vertebrae: Vertebral body height maintained without acute or chronic fracture. Bone marrow signal intensity within normal limits. No discrete or worrisome osseous lesions. No abnormal marrow edema or enhancement. Conus medullaris and cauda equina: Conus extends to the T12-L1 level. Conus and cauda equina appear normal. No abnormal enhancement. No epidural tumor. Paraspinal and other soft tissues: Paraspinous soft tissues within normal limits. Left kidney appears to be absent. Disc levels: L4-5: Diffuse disc bulge with disc desiccation. Superimposed shallow central disc protrusion with annular fissure. No spinal stenosis. Foramina remain patent. L5-S1: Diffuse disc bulge with disc desiccation. Mild reactive endplate change. Superimposed small central disc protrusion indents the ventral thecal sac. No significant spinal stenosis. Mild bilateral L5 foraminal narrowing. No impingement. IMPRESSION: 1. Normal MRI appearance of the cervical, thoracic, and  lumbar spine. No evidence for metastatic disease. No other findings to explain patient's symptoms identified. 2. Mild  lower lumbar degenerative disc disease without significant stenosis or neural impingement. Electronically Signed   By: Jeannine Boga M.D.   On: 04/22/2020 20:13      IMPRESSION/PLAN: This is a very pleasant 49 year old male with metastatic disease to the brain.  I had a lengthy discussion with the patient after reviewing their MRI results with them.  We reviewed his imaging together.  Based on the number of lesions I recommend whole brain radiation therapy.  We spoke about whole brain radiation therapy and the hair loss, fatigue and cognitive effects that can result from this treatment.  I explained that sometimes lesions, as they react to treatment, can provoke neurologic issues including seizures, increased weakness, numbness, headaches, nausea, or other issues.  However, we discussed that if he does not pursue treatment, his cancer will almost certainly progress in his brain.  I recommend that he undergo whole brain radiation therapy for 14 treatments over approximately 2 and half to 3 weeks to have the best chance of controlling progression of disease in his brain.   After thorough discussion consent form was signed and placed in his chart.   We talked about the dexamethasone dosing and the risks and benefits of the steroid.  I will reduce his dose to 4 mg 3 times daily for now and have started him on omeprazole for GI prophylaxis.  He is enthusiastic to proceed with treatment as recommended.  We will plan his treatment today and start his treatment in 2 days, on October 28.    Referral to social work for questions regarding leave from work and possible transportation needs to get to treatment.  On date of service, in total, I spent 50 minutes on this encounter. Patient was seen in person.  __________________________________________   Eppie Gibson, MD  This document  serves as a record of services personally performed by Eppie Gibson, MD. It was created on his behalf by Clerance Lav, a trained medical scribe. The creation of this record is based on the scribe's personal observations and the provider's statements to them. This document has been checked and approved by the attending provider.

## 2020-04-26 NOTE — Progress Notes (Signed)
[facility] Work  Clinical Social Work was referred by radiation oncology for assessment of psychosocial needs; specifically transportation. RN has already completed transportation referral. Clinical Social Worker attempted to follow up with patient by phone to offer support and assess for needs.  CSW unable to reach patient, left voicemail to return call.      Gwinda Maine, LCSW  Clinical Social Worker [facility]

## 2020-04-27 ENCOUNTER — Telehealth: Payer: Self-pay | Admitting: Emergency Medicine

## 2020-04-27 DIAGNOSIS — C7931 Secondary malignant neoplasm of brain: Secondary | ICD-10-CM | POA: Diagnosis not present

## 2020-04-27 DIAGNOSIS — C642 Malignant neoplasm of left kidney, except renal pelvis: Secondary | ICD-10-CM | POA: Diagnosis not present

## 2020-04-27 DIAGNOSIS — Z510 Encounter for antineoplastic radiation therapy: Secondary | ICD-10-CM | POA: Diagnosis not present

## 2020-04-27 NOTE — Telephone Encounter (Signed)
°   Marcus Callahan DOB: [birth year] MRN: [id number]   RIDER WAIVER AND RELEASE OF LIABILITY  For purposes of improving physical access to our facilities, [facility] is pleased to partner with third parties to provide [facility] patients or other authorized individuals the option of convenient, on-demand ground transportation services (the [employer]) through use of the technology service that enables users to request on-demand ground transportation from independent third-party providers.  By opting to use and accept these [employer], I, the undersigned, hereby agree on behalf of myself, and on behalf of any minor child using the [employer] for whom I am the parent or legal guardian, as follows:  1. Government social research officer provided to me are provided by independent third-party transportation providers who are not [employer] or employees and who are unaffiliated with [employer]. 2. [employer] is neither a transportation carrier nor a common or public carrier. 3. [facility] has no control over the quality or safety of the transportation that occurs as a result of the [employer]. 4. [facility] cannot guarantee that any third-party transportation provider will complete any arranged transportation service. 5. [facility] makes no representation, warranty, or guarantee regarding the reliability, timeliness, quality, safety, suitability, or availability of any of the Transport Services or that they will be error free. 6. I fully understand that traveling by vehicle involves risks and dangers of serious bodily injury, including permanent disability, paralysis, and death. I agree, on behalf of myself and on behalf of any minor child using the Transport Services for whom I am the parent or legal guardian, that the entire risk arising out of my use of the [employer] remains solely with me, to the maximum extent permitted under applicable law. 7. The [employer] are provided as is and as available. [facility] disclaims all representations and warranties, express, implied or statutory, not expressly set out in these terms, including the implied warranties of merchantability and fitness for a particular purpose. 8. I hereby waive and release [facility], its agents, employees, officers, directors, representatives, insurers, attorneys, assigns, successors, subsidiaries, and affiliates from any and all past, present, or future claims, demands, liabilities, actions, causes of action, or suits of any kind directly or indirectly arising from acceptance and use of the [employer]. 9. I further waive and release [facility] and its affiliates from all present and future liability and responsibility for any injury or death to persons or damages to property caused by or related to the use of the [employer]. 10. I have read this Waiver and Release of Liability, and I understand the terms used in it and their legal significance. This Waiver is freely and voluntarily given with the understanding that my right (as well as the right of any minor child for whom I am the parent or legal guardian using the [employer]) to legal recourse against [facility] in connection with the [employer] is knowingly surrendered in return for use of these services.   I attest that I read the consent document to Marcus Callahan, gave Marcus Callahan the opportunity to ask questions and answered the questions asked (if any). I affirm that Marcus Callahan then provided consent for he's participation in this program.     Marcus Callahan

## 2020-04-28 ENCOUNTER — Other Ambulatory Visit: Payer: Self-pay

## 2020-04-28 ENCOUNTER — Ambulatory Visit
Admission: RE | Admit: 2020-04-28 | Discharge: 2020-04-28 | Disposition: A | Discharge status: Home / Self Care | Payer: BC Managed Care – PPO | Source: Ambulatory Visit | Attending: Radiation Oncology | Admitting: Radiation Oncology

## 2020-04-28 DIAGNOSIS — C7931 Secondary malignant neoplasm of brain: Secondary | ICD-10-CM | POA: Diagnosis not present

## 2020-04-28 DIAGNOSIS — C642 Malignant neoplasm of left kidney, except renal pelvis: Secondary | ICD-10-CM | POA: Diagnosis not present

## 2020-04-28 DIAGNOSIS — Z510 Encounter for antineoplastic radiation therapy: Secondary | ICD-10-CM | POA: Diagnosis not present

## 2020-04-29 ENCOUNTER — Other Ambulatory Visit: Payer: Self-pay | Admitting: Oncology

## 2020-04-29 ENCOUNTER — Other Ambulatory Visit: Payer: Self-pay

## 2020-04-29 ENCOUNTER — Inpatient Hospital Stay (HOSPITAL_BASED_OUTPATIENT_CLINIC_OR_DEPARTMENT_OTHER): Payer: BC Managed Care – PPO | Admitting: Oncology

## 2020-04-29 ENCOUNTER — Inpatient Hospital Stay: Payer: BC Managed Care – PPO

## 2020-04-29 ENCOUNTER — Ambulatory Visit
Admission: RE | Admit: 2020-04-29 | Discharge: 2020-04-29 | Disposition: A | Discharge status: Home / Self Care | Payer: BC Managed Care – PPO | Source: Ambulatory Visit | Attending: Radiation Oncology | Admitting: Radiation Oncology

## 2020-04-29 VITALS — BP 102/49 | HR 64 | Temp 97.9°F | Resp 20 | Ht 70.0 in | Wt 219.2 lb

## 2020-04-29 DIAGNOSIS — Z510 Encounter for antineoplastic radiation therapy: Secondary | ICD-10-CM | POA: Diagnosis not present

## 2020-04-29 DIAGNOSIS — Z5112 Encounter for antineoplastic immunotherapy: Secondary | ICD-10-CM | POA: Diagnosis not present

## 2020-04-29 DIAGNOSIS — C7931 Secondary malignant neoplasm of brain: Secondary | ICD-10-CM | POA: Diagnosis not present

## 2020-04-29 DIAGNOSIS — Z79899 Other long term (current) drug therapy: Secondary | ICD-10-CM | POA: Diagnosis not present

## 2020-04-29 DIAGNOSIS — C642 Malignant neoplasm of left kidney, except renal pelvis: Secondary | ICD-10-CM | POA: Diagnosis not present

## 2020-04-29 DIAGNOSIS — I10 Essential (primary) hypertension: Secondary | ICD-10-CM | POA: Diagnosis not present

## 2020-04-29 DIAGNOSIS — Z7952 Long term (current) use of systemic steroids: Secondary | ICD-10-CM | POA: Diagnosis not present

## 2020-04-29 DIAGNOSIS — C787 Secondary malignant neoplasm of liver and intrahepatic bile duct: Secondary | ICD-10-CM | POA: Diagnosis not present

## 2020-04-29 LAB — CBC WITH DIFFERENTIAL (CANCER CENTER ONLY)
Abs Immature Granulocytes: 0.24 10*3/uL — ABNORMAL HIGH (ref 0.00–0.07)
Basophils Absolute: 0 10*3/uL (ref 0.0–0.1)
Basophils Relative: 0 %
Eosinophils Absolute: 0 10*3/uL (ref 0.0–0.5)
Eosinophils Relative: 0 %
HCT: 31.4 % — ABNORMAL LOW (ref 39.0–52.0)
Hemoglobin: 10.1 g/dL — ABNORMAL LOW (ref 13.0–17.0)
Immature Granulocytes: 1 %
Lymphocytes Relative: 5 %
Lymphs Abs: 1.1 10*3/uL (ref 0.7–4.0)
MCH: 24.5 pg — ABNORMAL LOW (ref 26.0–34.0)
MCHC: 32.2 g/dL (ref 30.0–36.0)
MCV: 76 fL — ABNORMAL LOW (ref 80.0–100.0)
Monocytes Absolute: 1.4 10*3/uL — ABNORMAL HIGH (ref 0.1–1.0)
Monocytes Relative: 6 %
Neutro Abs: 19.5 10*3/uL — ABNORMAL HIGH (ref 1.7–7.7)
Neutrophils Relative %: 88 %
Platelet Count: 263 10*3/uL (ref 150–400)
RBC: 4.13 MIL/uL — ABNORMAL LOW (ref 4.22–5.81)
RDW: 15.1 % (ref 11.5–15.5)
WBC Count: 22.3 10*3/uL — ABNORMAL HIGH (ref 4.0–10.5)
nRBC: 0 % (ref 0.0–0.2)

## 2020-04-29 LAB — CMP (CANCER CENTER ONLY)
ALT: 134 U/L — ABNORMAL HIGH (ref 0–44)
AST: 192 U/L (ref 15–41)
Albumin: 3.5 g/dL (ref 3.5–5.0)
Alkaline Phosphatase: 183 U/L — ABNORMAL HIGH (ref 38–126)
Anion gap: 12 (ref 5–15)
BUN: 58 mg/dL — ABNORMAL HIGH (ref 6–20)
CO2: 18 mmol/L — ABNORMAL LOW (ref 22–32)
Calcium: 9.5 mg/dL (ref 8.9–10.3)
Chloride: 106 mmol/L (ref 98–111)
Creatinine: 1.92 mg/dL — ABNORMAL HIGH (ref 0.61–1.24)
GFR, Estimated: 42 mL/min — ABNORMAL LOW (ref >60)
Glucose, Bld: 96 mg/dL (ref 70–99)
Potassium: 4.5 mmol/L (ref 3.5–5.1)
Sodium: 136 mmol/L (ref 135–145)
Total Bilirubin: 0.6 mg/dL (ref 0.3–1.2)
Total Protein: 7.4 g/dL (ref 6.5–8.1)

## 2020-04-29 LAB — TSH: TSH: 0.51 u[IU]/mL (ref 0.320–4.118)

## 2020-04-29 NOTE — Progress Notes (Signed)
Hematology and Oncology Follow Up Visit  Marcus Callahan [id number] September 26, 1970 49 y.o. 04/29/2020 10:48 AM Marcus Callahan, MDSagardia, [place], [place]   Principle Diagnosis: 49 year old man with stage IV clear-cell renal cell carcinoma with hepatic involvement diagnosed in February 2021.  He has subsequently developed CNS metastasis.   Prior Therapy:  He is status post robotic assisted laparoscopic left radical nephrectomy completed on August 26, 2019.  Final pathology indicated T3a tumor with invasion into the perirenal soft tissue, renal pelvis and segmental renal vein and renal sinus.  Axitinib 5 mg twice a day with Pembrolizumab 200 mg every 3 weeks started on September 18, 2019.  He completed 8 cycles of therapy in August 2021 and developed progression of disease.   Current therapy: Ipilimumab and nivolumab started on March 18, 2020.   He is here for cycle 3 of therapy.  He is receiving whole brain radiation concurrently.  Interim History:  Marcus Callahan returns today for a follow-up visit.  Since the last visit, he was seen in the emergency department on April 22, 2020 after presenting with right lower extremity weakness.  He underwent MRI of the brain on April 22, 2020 which showed multiple hemorrhagic brain metastasis.  He is currently receiving palliative radiation therapy.  He is currently on dexamethasone with tapering doses currently taking it 2-3 times a day.  He does report weakness in his right leg and toes but no weakness in his upper extremities.  Denies any headaches, blurry vision or seizures.  His mobility is reasonable using a cane.     Medications: Unchanged on review. Current Outpatient Medications  Medication Sig Dispense Refill  . acetaminophen (TYLENOL) 500 MG tablet Take 1,000 mg by mouth every 6 (six) hours as needed for mild pain.    Marland Kitchen amLODipine (NORVASC) 10 MG tablet Take 1 tablet (10 mg total) by mouth daily. 90 tablet 2  .  dexamethasone (DECADRON) 4 MG tablet Take 1 tablet (4 mg total) by mouth every 6 (six) hours. 120 tablet 0  . lisinopril-hydrochlorothiazide (ZESTORETIC) 20-25 MG tablet TAKE 1 TABLET BY MOUTH DAILY 90 tablet 1  . metoprolol succinate (TOPROL-XL) 100 MG 24 hr tablet Take 1 tablet (100 mg total) by mouth daily. Take with or immediately following a meal. 90 tablet 2  . Multiple Vitamin (MULTIVITAMIN) LIQD Take 5 mLs by mouth daily. (Patient not taking: Reported on 04/26/2020)    . omeprazole (PRILOSEC) 20 MG capsule Take 1 capsule (20 mg total) by mouth daily. 30 capsule 3   No current facility-administered medications for this visit.     Allergies:  Allergies  Allergen Reactions  . Codeine Other (See Comments)    Nose bleeds      Physical Exam:     Blood pressure (!) 102/49, pulse 64, temperature 97.9 F (36.6 C), temperature source Tympanic, resp. rate 20, height 5\' 10"  (1.778 m), weight 219 lb 3.2 oz (99.4 kg), SpO2 100 %.     ECOG: 1    General appearance: Comfortable appearing without any discomfort Head: Normocephalic without any trauma Oropharynx: Mucous membranes are moist and pink without any thrush or ulcers. Eyes: Pupils are equal and round reactive to light. Lymph nodes: No cervical, supraclavicular, inguinal or axillary lymphadenopathy.   Heart:regular rate and rhythm.  S1 and S2 without leg edema. Lung: Clear without any rhonchi or wheezes.  No dullness to percussion. Abdomin: Soft, nontender, nondistended with good bowel sounds.  No hepatosplenomegaly. Musculoskeletal: No joint deformity or effusion.  Full  range of motion noted. Neurological: Motor deficit noted and is right lower extremity including knee extension and flexion. Skin: No petechial rash or dryness.  Appeared moist.              Lab Results: Lab Results  Component Value Date   WBC 9.1 04/23/2020   HGB 8.7 (L) 04/23/2020   HCT 28.4 (L) 04/23/2020   MCV 79.1 (L) 04/23/2020   PLT  228 04/23/2020     Chemistry      Component Value Date/Time   NA 136 04/23/2020 0152   NA 141 09/28/2019 1517   K 5.5 (H) 04/23/2020 0152   CL 106 04/23/2020 0152   CO2 17 (L) 04/23/2020 0152   BUN 22 (H) 04/23/2020 0152   BUN 22 09/28/2019 1517   CREATININE 1.63 (H) 04/23/2020 0152   CREATININE 1.71 (H) 04/08/2020 0958      Component Value Date/Time   CALCIUM 9.6 04/23/2020 0152   ALKPHOS 174 (H) 04/23/2020 0152   AST 198 (H) 04/23/2020 0152   AST 74 (H) 04/08/2020 0958   ALT 134 (H) 04/23/2020 0152   ALT 53 (H) 04/08/2020 0958   BILITOT 1.0 04/23/2020 0152   BILITOT 0.4 04/08/2020 0958        IMPRESSION: Multiple hemorrhagic brain masses consistent with metastatic disease in this patient with history of renal cell carcinoma. At least 34 lesions are seen. Up to moderate vasogenic edema without midline shift or herniation.    Impression and Plan:  49 year old man with:  1.  Stage IV clear-cell renal cell carcinoma with initially hepatic metastasis and currently brain involvement.    The has completed 2 cycles of ipilimumab with nivolumab and developed a CNS metastasis while on treatment.  Risks and benefits of continuing treatment today were discussed.  I see no problems continuing immunotherapy while receiving radiation it would be problematic he is on steroids.  I recommended that with holding immunotherapy until his steroid is close to being tapered off.  Anticipate resuming cycle 3 in 3 weeks.  We will update his imaging studies after that.   2.  Hypertension: Blood pressure is within normal range at this time.  3.  Immune mediated complications.  He has not experienced any at this time.  I continue to educate him about potential complication occluding pneumonitis, colitis and thyroid disease.  4.  Nausea prophylaxis: Compazine is available for him without any nausea or vomiting.  5.  CNS metastasis: He is currently receiving radiation therapy and to be  tapered off dexamethasone.  We will arrange for physical therapy given his right lower extremity weakness.  6.  IV access: Peripheral veins will be in use at this time.  7.  Prognosis and goals of care: His disease has been progressive despite aggressive treatment.  He still has excellent performance status and aggressive measures are warranted.  8.  Follow-up: He will return in 3 weeks for repeat follow-up.   30 minutes were spent on this encounter.  Time was dedicated to reviewing his disease status, treatment options and overall prognosis and future plan of care.    Zola Button, MD 10/29/202110:48 AM

## 2020-05-02 ENCOUNTER — Ambulatory Visit
Admission: RE | Admit: 2020-05-02 | Discharge: 2020-05-02 | Disposition: A | Discharge status: Home / Self Care | Payer: BC Managed Care – PPO | Source: Ambulatory Visit | Attending: Radiation Oncology | Admitting: Radiation Oncology

## 2020-05-02 ENCOUNTER — Other Ambulatory Visit: Payer: Self-pay

## 2020-05-02 DIAGNOSIS — Z7952 Long term (current) use of systemic steroids: Secondary | ICD-10-CM | POA: Diagnosis not present

## 2020-05-02 DIAGNOSIS — M48061 Spinal stenosis, lumbar region without neurogenic claudication: Secondary | ICD-10-CM | POA: Diagnosis not present

## 2020-05-02 DIAGNOSIS — M5126 Other intervertebral disc displacement, lumbar region: Secondary | ICD-10-CM | POA: Diagnosis not present

## 2020-05-02 DIAGNOSIS — M5127 Other intervertebral disc displacement, lumbosacral region: Secondary | ICD-10-CM | POA: Diagnosis not present

## 2020-05-02 DIAGNOSIS — C7931 Secondary malignant neoplasm of brain: Secondary | ICD-10-CM | POA: Insufficient documentation

## 2020-05-02 DIAGNOSIS — M4050 Lordosis, unspecified, site unspecified: Secondary | ICD-10-CM | POA: Diagnosis not present

## 2020-05-02 DIAGNOSIS — C649 Malignant neoplasm of unspecified kidney, except renal pelvis: Secondary | ICD-10-CM | POA: Diagnosis not present

## 2020-05-02 DIAGNOSIS — Z9181 History of falling: Secondary | ICD-10-CM | POA: Diagnosis not present

## 2020-05-02 DIAGNOSIS — I10 Essential (primary) hypertension: Secondary | ICD-10-CM | POA: Diagnosis not present

## 2020-05-02 DIAGNOSIS — M40204 Unspecified kyphosis, thoracic region: Secondary | ICD-10-CM | POA: Diagnosis not present

## 2020-05-02 MED ORDER — SONAFINE EX EMUL
1.0000 "application " | Freq: Two times a day (BID) | CUTANEOUS | Status: DC
  Administered 2020-05-02: 1 via TOPICAL

## 2020-05-02 NOTE — Progress Notes (Signed)
Pt here for patient teaching.    Pt given Radiation and You booklet, skin care instructions and Sonafine.    Reviewed areas of pertinence such as fatigue, hair loss, nausea and vomiting, skin changes, headache and blurry vision .   Pt able to give teach back of to pat skin, use unscented/gentle soap and drink plenty of water,apply Sonafine bid and avoid applying anything to skin within 4 hours of treatment.   Pt demonstrated understanding and verbalizes understanding of information given and will contact nursing with any questions or concerns.    [email or web address]

## 2020-05-03 ENCOUNTER — Other Ambulatory Visit: Payer: Self-pay

## 2020-05-03 ENCOUNTER — Ambulatory Visit
Admission: RE | Admit: 2020-05-03 | Discharge: 2020-05-03 | Disposition: A | Discharge status: Home / Self Care | Payer: BC Managed Care – PPO | Source: Ambulatory Visit | Attending: Radiation Oncology | Admitting: Radiation Oncology

## 2020-05-03 DIAGNOSIS — C7931 Secondary malignant neoplasm of brain: Secondary | ICD-10-CM | POA: Diagnosis not present

## 2020-05-04 ENCOUNTER — Ambulatory Visit
Admission: RE | Admit: 2020-05-04 | Discharge: 2020-05-04 | Disposition: A | Discharge status: Home / Self Care | Payer: BC Managed Care – PPO | Source: Ambulatory Visit | Attending: Radiation Oncology | Admitting: Radiation Oncology

## 2020-05-04 ENCOUNTER — Telehealth: Payer: Self-pay | Admitting: Internal Medicine

## 2020-05-04 DIAGNOSIS — C7931 Secondary malignant neoplasm of brain: Secondary | ICD-10-CM | POA: Diagnosis not present

## 2020-05-04 NOTE — Telephone Encounter (Signed)
Marcus Callahan from palliative medicine, reached out to this patient and spoke to him briefly by telephone. Per [name], he was very pleasant but was not interested in meeting or having community based palliative see him in his home.   He has [name]'s contact information if he changes his mind.     Mont Dutton R.T.(R)(T) Radiation Special Procedures Navigator

## 2020-05-05 ENCOUNTER — Ambulatory Visit
Admission: RE | Admit: 2020-05-05 | Discharge: 2020-05-05 | Disposition: A | Discharge status: Home / Self Care | Payer: BC Managed Care – PPO | Source: Ambulatory Visit | Attending: Radiation Oncology | Admitting: Radiation Oncology

## 2020-05-05 DIAGNOSIS — C7931 Secondary malignant neoplasm of brain: Secondary | ICD-10-CM | POA: Diagnosis not present

## 2020-05-06 ENCOUNTER — Ambulatory Visit
Admission: RE | Admit: 2020-05-06 | Discharge: 2020-05-06 | Disposition: A | Discharge status: Home / Self Care | Payer: BC Managed Care – PPO | Source: Ambulatory Visit | Attending: Radiation Oncology | Admitting: Radiation Oncology

## 2020-05-06 ENCOUNTER — Other Ambulatory Visit: Payer: Self-pay

## 2020-05-06 DIAGNOSIS — C7931 Secondary malignant neoplasm of brain: Secondary | ICD-10-CM | POA: Diagnosis not present

## 2020-05-09 ENCOUNTER — Ambulatory Visit
Admission: RE | Admit: 2020-05-09 | Discharge: 2020-05-09 | Disposition: A | Discharge status: Home / Self Care | Payer: BC Managed Care – PPO | Source: Ambulatory Visit | Attending: Radiation Oncology | Admitting: Radiation Oncology

## 2020-05-09 DIAGNOSIS — I10 Essential (primary) hypertension: Secondary | ICD-10-CM | POA: Diagnosis not present

## 2020-05-09 DIAGNOSIS — C649 Malignant neoplasm of unspecified kidney, except renal pelvis: Secondary | ICD-10-CM | POA: Diagnosis not present

## 2020-05-09 DIAGNOSIS — Z9181 History of falling: Secondary | ICD-10-CM | POA: Diagnosis not present

## 2020-05-09 DIAGNOSIS — M40204 Unspecified kyphosis, thoracic region: Secondary | ICD-10-CM | POA: Diagnosis not present

## 2020-05-09 DIAGNOSIS — M48061 Spinal stenosis, lumbar region without neurogenic claudication: Secondary | ICD-10-CM | POA: Diagnosis not present

## 2020-05-09 DIAGNOSIS — Z7952 Long term (current) use of systemic steroids: Secondary | ICD-10-CM | POA: Diagnosis not present

## 2020-05-09 DIAGNOSIS — M4050 Lordosis, unspecified, site unspecified: Secondary | ICD-10-CM | POA: Diagnosis not present

## 2020-05-09 DIAGNOSIS — M5127 Other intervertebral disc displacement, lumbosacral region: Secondary | ICD-10-CM | POA: Diagnosis not present

## 2020-05-09 DIAGNOSIS — C7931 Secondary malignant neoplasm of brain: Secondary | ICD-10-CM | POA: Diagnosis not present

## 2020-05-09 DIAGNOSIS — M5126 Other intervertebral disc displacement, lumbar region: Secondary | ICD-10-CM | POA: Diagnosis not present

## 2020-05-10 ENCOUNTER — Ambulatory Visit
Admission: RE | Admit: 2020-05-10 | Discharge: 2020-05-10 | Disposition: A | Discharge status: Home / Self Care | Payer: BC Managed Care – PPO | Source: Ambulatory Visit | Attending: Radiation Oncology | Admitting: Radiation Oncology

## 2020-05-10 ENCOUNTER — Other Ambulatory Visit: Payer: Self-pay | Admitting: Radiation Oncology

## 2020-05-10 DIAGNOSIS — C7931 Secondary malignant neoplasm of brain: Secondary | ICD-10-CM | POA: Diagnosis not present

## 2020-05-11 ENCOUNTER — Ambulatory Visit
Admission: RE | Admit: 2020-05-11 | Discharge: 2020-05-11 | Disposition: A | Discharge status: Home / Self Care | Payer: BC Managed Care – PPO | Source: Ambulatory Visit | Attending: Radiation Oncology | Admitting: Radiation Oncology

## 2020-05-11 DIAGNOSIS — C7931 Secondary malignant neoplasm of brain: Secondary | ICD-10-CM | POA: Diagnosis not present

## 2020-05-12 ENCOUNTER — Ambulatory Visit
Admission: RE | Admit: 2020-05-12 | Discharge: 2020-05-12 | Disposition: A | Discharge status: Home / Self Care | Payer: BC Managed Care – PPO | Source: Ambulatory Visit | Attending: Radiation Oncology | Admitting: Radiation Oncology

## 2020-05-12 DIAGNOSIS — C7931 Secondary malignant neoplasm of brain: Secondary | ICD-10-CM | POA: Diagnosis not present

## 2020-05-13 ENCOUNTER — Encounter: Payer: Self-pay | Admitting: Radiation Oncology

## 2020-05-13 ENCOUNTER — Ambulatory Visit
Admission: RE | Admit: 2020-05-13 | Discharge: 2020-05-13 | Disposition: A | Discharge status: Home / Self Care | Payer: BC Managed Care – PPO | Source: Ambulatory Visit | Attending: Radiation Oncology | Admitting: Radiation Oncology

## 2020-05-13 DIAGNOSIS — Z885 Allergy status to narcotic agent status: Secondary | ICD-10-CM | POA: Diagnosis not present

## 2020-05-13 DIAGNOSIS — Z905 Acquired absence of kidney: Secondary | ICD-10-CM | POA: Diagnosis not present

## 2020-05-13 DIAGNOSIS — N179 Acute kidney failure, unspecified: Secondary | ICD-10-CM | POA: Diagnosis not present

## 2020-05-13 DIAGNOSIS — K7200 Acute and subacute hepatic failure without coma: Secondary | ICD-10-CM | POA: Diagnosis not present

## 2020-05-13 DIAGNOSIS — Z66 Do not resuscitate: Secondary | ICD-10-CM | POA: Diagnosis not present

## 2020-05-13 DIAGNOSIS — E875 Hyperkalemia: Secondary | ICD-10-CM | POA: Diagnosis not present

## 2020-05-13 DIAGNOSIS — Z515 Encounter for palliative care: Secondary | ICD-10-CM | POA: Diagnosis not present

## 2020-05-13 DIAGNOSIS — J80 Acute respiratory distress syndrome: Secondary | ICD-10-CM | POA: Diagnosis not present

## 2020-05-13 DIAGNOSIS — Z7952 Long term (current) use of systemic steroids: Secondary | ICD-10-CM | POA: Diagnosis not present

## 2020-05-13 DIAGNOSIS — J90 Pleural effusion, not elsewhere classified: Secondary | ICD-10-CM | POA: Diagnosis not present

## 2020-05-13 DIAGNOSIS — E872 Acidosis: Secondary | ICD-10-CM | POA: Diagnosis not present

## 2020-05-13 DIAGNOSIS — Z4682 Encounter for fitting and adjustment of non-vascular catheter: Secondary | ICD-10-CM | POA: Diagnosis not present

## 2020-05-13 DIAGNOSIS — R21 Rash and other nonspecific skin eruption: Secondary | ICD-10-CM | POA: Diagnosis not present

## 2020-05-13 DIAGNOSIS — Z9221 Personal history of antineoplastic chemotherapy: Secondary | ICD-10-CM | POA: Diagnosis not present

## 2020-05-13 DIAGNOSIS — C7931 Secondary malignant neoplasm of brain: Secondary | ICD-10-CM | POA: Diagnosis not present

## 2020-05-13 DIAGNOSIS — Z20822 Contact with and (suspected) exposure to covid-19: Secondary | ICD-10-CM | POA: Diagnosis not present

## 2020-05-13 DIAGNOSIS — C787 Secondary malignant neoplasm of liver and intrahepatic bile duct: Secondary | ICD-10-CM | POA: Diagnosis not present

## 2020-05-13 DIAGNOSIS — K7201 Acute and subacute hepatic failure with coma: Secondary | ICD-10-CM | POA: Diagnosis not present

## 2020-05-13 DIAGNOSIS — R4182 Altered mental status, unspecified: Secondary | ICD-10-CM | POA: Diagnosis not present

## 2020-05-13 DIAGNOSIS — D649 Anemia, unspecified: Secondary | ICD-10-CM | POA: Diagnosis not present

## 2020-05-13 DIAGNOSIS — C642 Malignant neoplasm of left kidney, except renal pelvis: Secondary | ICD-10-CM | POA: Diagnosis not present

## 2020-05-13 DIAGNOSIS — Z923 Personal history of irradiation: Secondary | ICD-10-CM | POA: Diagnosis not present

## 2020-05-13 DIAGNOSIS — Z79899 Other long term (current) drug therapy: Secondary | ICD-10-CM | POA: Diagnosis not present

## 2020-05-13 DIAGNOSIS — J9690 Respiratory failure, unspecified, unspecified whether with hypoxia or hypercapnia: Secondary | ICD-10-CM | POA: Diagnosis not present

## 2020-05-16 ENCOUNTER — Ambulatory Visit: Payer: BC Managed Care – PPO

## 2020-05-16 ENCOUNTER — Emergency Department (HOSPITAL_COMMUNITY): Payer: BC Managed Care – PPO

## 2020-05-16 ENCOUNTER — Inpatient Hospital Stay (HOSPITAL_COMMUNITY)
Admission: EM | Admit: 2020-05-16 | Discharge: 2020-06-01 | DRG: 682 | Disposition: E | Payer: BC Managed Care – PPO | Attending: Internal Medicine | Admitting: Internal Medicine

## 2020-05-16 ENCOUNTER — Other Ambulatory Visit: Payer: Self-pay

## 2020-05-16 ENCOUNTER — Encounter (HOSPITAL_COMMUNITY): Payer: Self-pay | Admitting: Emergency Medicine

## 2020-05-16 ENCOUNTER — Telehealth: Payer: Self-pay | Admitting: *Deleted

## 2020-05-16 DIAGNOSIS — Z515 Encounter for palliative care: Secondary | ICD-10-CM

## 2020-05-16 DIAGNOSIS — N179 Acute kidney failure, unspecified: Principal | ICD-10-CM | POA: Diagnosis present

## 2020-05-16 DIAGNOSIS — J9690 Respiratory failure, unspecified, unspecified whether with hypoxia or hypercapnia: Secondary | ICD-10-CM | POA: Diagnosis present

## 2020-05-16 DIAGNOSIS — Z9221 Personal history of antineoplastic chemotherapy: Secondary | ICD-10-CM

## 2020-05-16 DIAGNOSIS — Z66 Do not resuscitate: Secondary | ICD-10-CM | POA: Diagnosis not present

## 2020-05-16 DIAGNOSIS — K7290 Hepatic failure, unspecified without coma: Secondary | ICD-10-CM | POA: Diagnosis present

## 2020-05-16 DIAGNOSIS — Z7952 Long term (current) use of systemic steroids: Secondary | ICD-10-CM | POA: Diagnosis not present

## 2020-05-16 DIAGNOSIS — E875 Hyperkalemia: Secondary | ICD-10-CM | POA: Diagnosis present

## 2020-05-16 DIAGNOSIS — Z885 Allergy status to narcotic agent status: Secondary | ICD-10-CM | POA: Diagnosis not present

## 2020-05-16 DIAGNOSIS — C642 Malignant neoplasm of left kidney, except renal pelvis: Secondary | ICD-10-CM | POA: Diagnosis present

## 2020-05-16 DIAGNOSIS — C787 Secondary malignant neoplasm of liver and intrahepatic bile duct: Secondary | ICD-10-CM | POA: Diagnosis present

## 2020-05-16 DIAGNOSIS — D649 Anemia, unspecified: Secondary | ICD-10-CM | POA: Diagnosis present

## 2020-05-16 DIAGNOSIS — Z905 Acquired absence of kidney: Secondary | ICD-10-CM | POA: Diagnosis not present

## 2020-05-16 DIAGNOSIS — Z20822 Contact with and (suspected) exposure to covid-19: Secondary | ICD-10-CM | POA: Diagnosis present

## 2020-05-16 DIAGNOSIS — Z923 Personal history of irradiation: Secondary | ICD-10-CM

## 2020-05-16 DIAGNOSIS — C7931 Secondary malignant neoplasm of brain: Secondary | ICD-10-CM | POA: Diagnosis present

## 2020-05-16 DIAGNOSIS — K7200 Acute and subacute hepatic failure without coma: Secondary | ICD-10-CM | POA: Diagnosis present

## 2020-05-16 DIAGNOSIS — Z79899 Other long term (current) drug therapy: Secondary | ICD-10-CM | POA: Diagnosis not present

## 2020-05-16 DIAGNOSIS — E872 Acidosis: Secondary | ICD-10-CM | POA: Diagnosis present

## 2020-05-16 DIAGNOSIS — K7201 Acute and subacute hepatic failure with coma: Secondary | ICD-10-CM | POA: Diagnosis not present

## 2020-05-16 DIAGNOSIS — N19 Unspecified kidney failure: Secondary | ICD-10-CM | POA: Diagnosis present

## 2020-05-16 LAB — CBC WITH DIFFERENTIAL/PLATELET
Abs Immature Granulocytes: 1.46 10*3/uL — ABNORMAL HIGH (ref 0.00–0.07)
Basophils Absolute: 0.1 10*3/uL (ref 0.0–0.1)
Basophils Relative: 0 %
Eosinophils Absolute: 0.1 10*3/uL (ref 0.0–0.5)
Eosinophils Relative: 0 %
HCT: 24.1 % — ABNORMAL LOW (ref 39.0–52.0)
Hemoglobin: 6.8 g/dL — CL (ref 13.0–17.0)
Immature Granulocytes: 7 %
Lymphocytes Relative: 5 %
Lymphs Abs: 1.1 10*3/uL (ref 0.7–4.0)
MCH: 24.5 pg — ABNORMAL LOW (ref 26.0–34.0)
MCHC: 28.2 g/dL — ABNORMAL LOW (ref 30.0–36.0)
MCV: 86.7 fL (ref 80.0–100.0)
Monocytes Absolute: 1.1 10*3/uL — ABNORMAL HIGH (ref 0.1–1.0)
Monocytes Relative: 5 %
Neutro Abs: 17.7 10*3/uL — ABNORMAL HIGH (ref 1.7–7.7)
Neutrophils Relative %: 83 %
Platelets: 65 10*3/uL — ABNORMAL LOW (ref 150–400)
RBC: 2.78 MIL/uL — ABNORMAL LOW (ref 4.22–5.81)
RDW: 17.8 % — ABNORMAL HIGH (ref 11.5–15.5)
WBC: 21.5 10*3/uL — ABNORMAL HIGH (ref 4.0–10.5)
nRBC: 0 % (ref 0.0–0.2)

## 2020-05-16 LAB — COMPREHENSIVE METABOLIC PANEL
ALT: 1932 U/L — ABNORMAL HIGH (ref 0–44)
AST: 1773 U/L — ABNORMAL HIGH (ref 15–41)
Albumin: 2.5 g/dL — ABNORMAL LOW (ref 3.5–5.0)
Alkaline Phosphatase: 291 U/L — ABNORMAL HIGH (ref 38–126)
BUN: 140 mg/dL — ABNORMAL HIGH (ref 6–20)
CO2: <7 mmol/L — ABNORMAL LOW (ref 22–32)
Calcium: 8.5 mg/dL — ABNORMAL LOW (ref 8.9–10.3)
Chloride: 103 mmol/L (ref 98–111)
Creatinine, Ser: 7.34 mg/dL — ABNORMAL HIGH (ref 0.61–1.24)
GFR, Estimated: 8 mL/min — ABNORMAL LOW (ref >60)
Glucose, Bld: 130 mg/dL — ABNORMAL HIGH (ref 70–99)
Potassium: >7.5 mmol/L (ref 3.5–5.1)
Sodium: 136 mmol/L (ref 135–145)
Total Bilirubin: 0.9 mg/dL (ref 0.3–1.2)
Total Protein: 5.6 g/dL — ABNORMAL LOW (ref 6.5–8.1)

## 2020-05-16 LAB — ETHANOL: Alcohol, Ethyl (B): 45 mg/dL — ABNORMAL HIGH (ref <10)

## 2020-05-16 LAB — URINALYSIS, ROUTINE W REFLEX MICROSCOPIC
Bilirubin Urine: NEGATIVE
Glucose, UA: NEGATIVE mg/dL
Ketones, ur: NEGATIVE mg/dL
Leukocytes,Ua: NEGATIVE
Nitrite: NEGATIVE
Protein, ur: 100 mg/dL — AB
RBC / HPF: >50 RBC/hpf — ABNORMAL HIGH (ref 0–5)
Specific Gravity, Urine: 1.018 (ref 1.005–1.030)
pH: 5 (ref 5.0–8.0)

## 2020-05-16 LAB — I-STAT ARTERIAL BLOOD GAS, ED
Acid-base deficit: 25 mmol/L — ABNORMAL HIGH (ref 0.0–2.0)
Bicarbonate: 5.2 mmol/L — ABNORMAL LOW (ref 20.0–28.0)
Calcium, Ion: 1.2 mmol/L (ref 1.15–1.40)
HCT: 15 % — ABNORMAL LOW (ref 39.0–52.0)
Hemoglobin: 5.1 g/dL — CL (ref 13.0–17.0)
O2 Saturation: 100 %
Potassium: 7.9 mmol/L (ref 3.5–5.1)
Sodium: 134 mmol/L — ABNORMAL LOW (ref 135–145)
TCO2: 6 mmol/L — ABNORMAL LOW (ref 22–32)
pCO2 arterial: 27.1 mmHg — ABNORMAL LOW (ref 32.0–48.0)
pH, Arterial: 6.893 — CL (ref 7.350–7.450)
pO2, Arterial: 537 mmHg — ABNORMAL HIGH (ref 83.0–108.0)

## 2020-05-16 LAB — RAPID URINE DRUG SCREEN, HOSP PERFORMED
Amphetamines: NOT DETECTED
Barbiturates: NOT DETECTED
Benzodiazepines: NOT DETECTED
Cocaine: NOT DETECTED
Opiates: NOT DETECTED
Tetrahydrocannabinol: NOT DETECTED

## 2020-05-16 LAB — PREPARE RBC (CROSSMATCH)

## 2020-05-16 LAB — I-STAT VENOUS BLOOD GAS, ED
Acid-base deficit: 28 mmol/L — ABNORMAL HIGH (ref 0.0–2.0)
Bicarbonate: 3.6 mmol/L — ABNORMAL LOW (ref 20.0–28.0)
Calcium, Ion: 1.08 mmol/L — ABNORMAL LOW (ref 1.15–1.40)
HCT: 21 % — ABNORMAL LOW (ref 39.0–52.0)
Hemoglobin: 7.1 g/dL — ABNORMAL LOW (ref 13.0–17.0)
O2 Saturation: 80 %
Potassium: >8.5 mmol/L (ref 3.5–5.1)
Sodium: 131 mmol/L — ABNORMAL LOW (ref 135–145)
TCO2: <5 mmol/L — ABNORMAL LOW (ref 22–32)
pCO2, Ven: 22.4 mmHg — ABNORMAL LOW (ref 44.0–60.0)
pH, Ven: 6.817 — CL (ref 7.250–7.430)
pO2, Ven: 79 mmHg — ABNORMAL HIGH (ref 32.0–45.0)

## 2020-05-16 LAB — AMMONIA: Ammonia: 84 umol/L — ABNORMAL HIGH (ref 9–35)

## 2020-05-16 LAB — CBG MONITORING, ED
Glucose-Capillary: 95 mg/dL (ref 70–99)
Glucose-Capillary: 155 mg/dL — ABNORMAL HIGH (ref 70–99)

## 2020-05-16 LAB — RESPIRATORY PANEL BY RT PCR (FLU A&B, COVID)
Influenza A by PCR: NEGATIVE
Influenza B by PCR: NEGATIVE
SARS Coronavirus 2 by RT PCR: NEGATIVE

## 2020-05-16 LAB — LACTIC ACID, PLASMA: Lactic Acid, Venous: >11 mmol/L (ref 0.5–1.9)

## 2020-05-16 LAB — TROPONIN I (HIGH SENSITIVITY): Troponin I (High Sensitivity): 31 ng/L — ABNORMAL HIGH (ref <18)

## 2020-05-16 IMAGING — DX DG CHEST 1V PORT
1 series · 1 of 1 positions shown · non-contrast
Comparison: [DATE]

CLINICAL DATA: ETT repositioning

EXAM:
PORTABLE CHEST 1 VIEW

[chest ap]
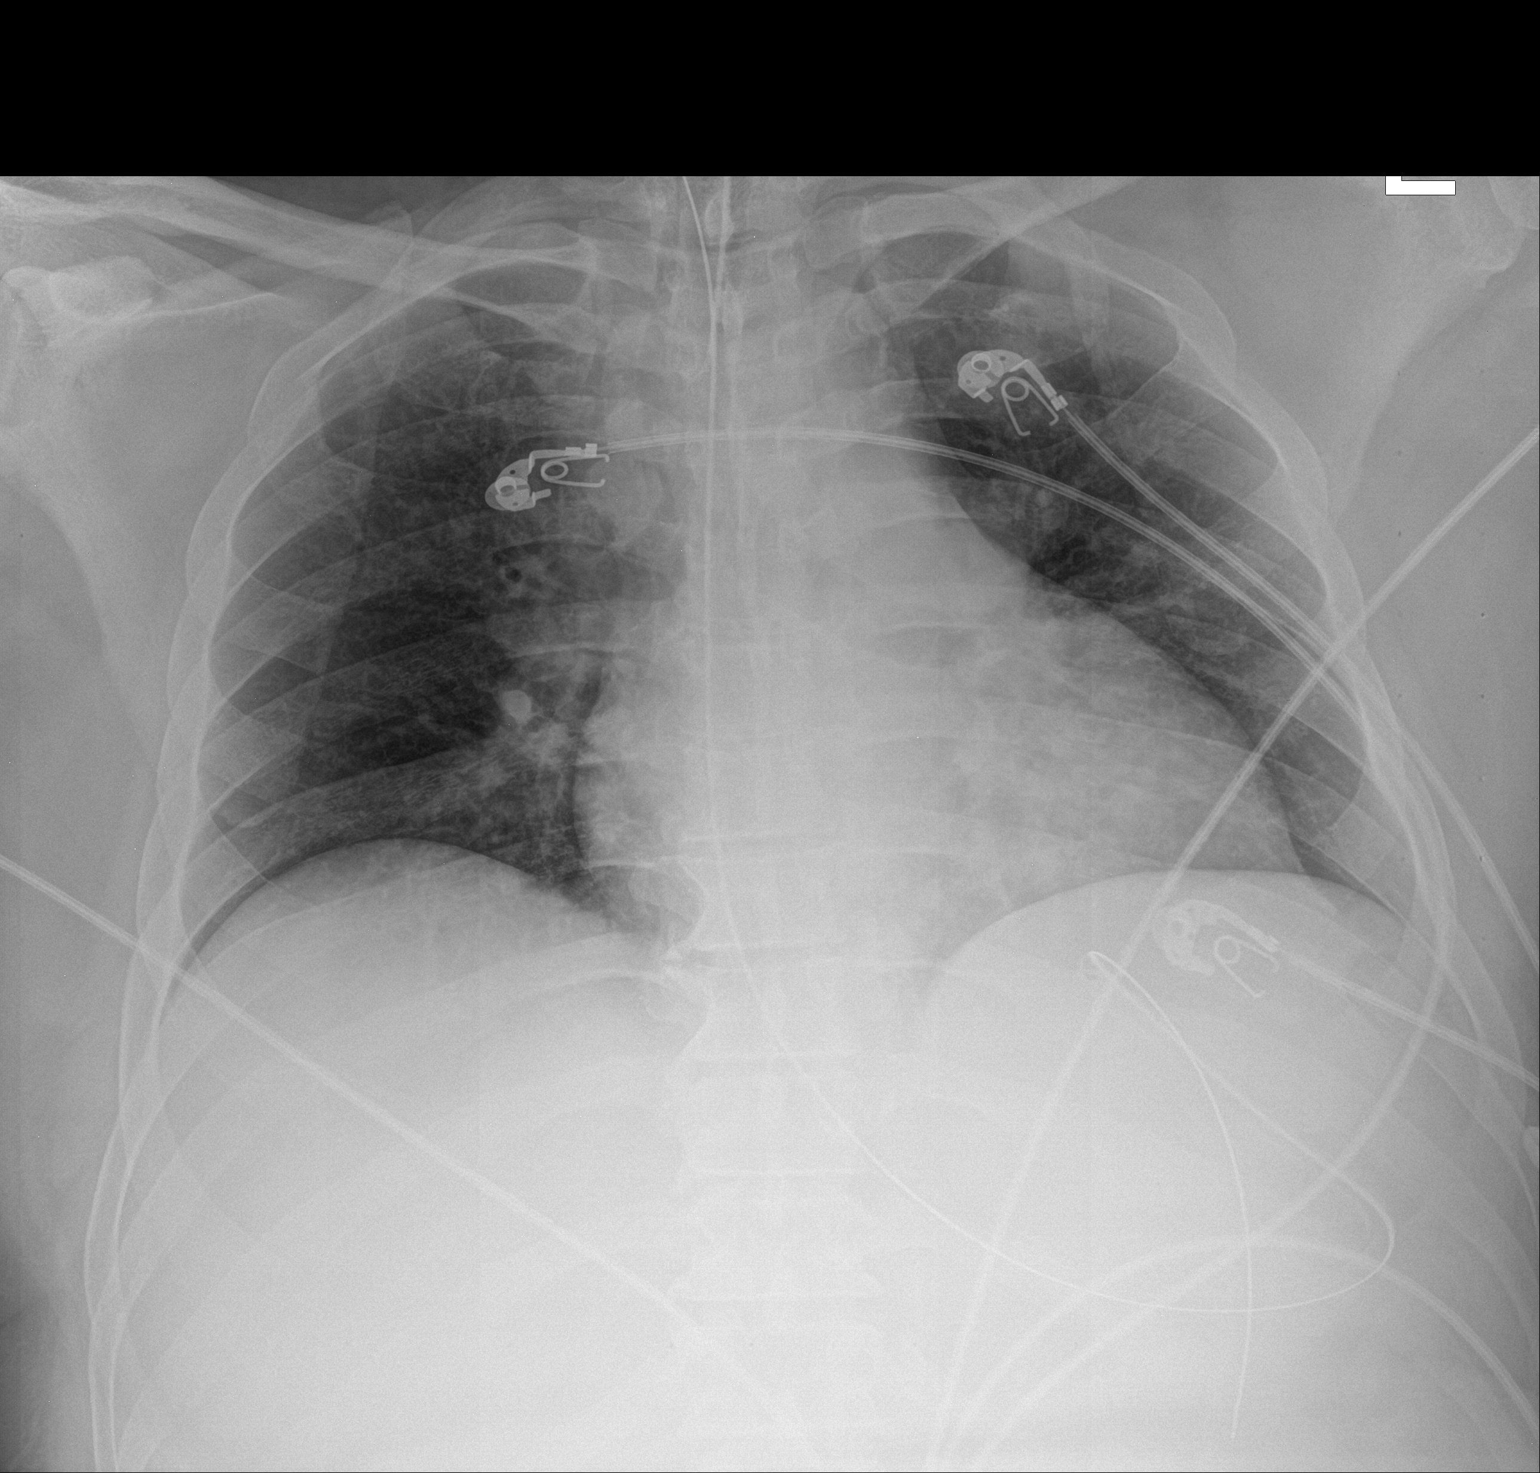

[1 of 1 positions shown; findings below may reference images not displayed]

FINDINGS: The heart size and mediastinal contours are within normal limits.
Both lungs are clear. The visualized skeletal structures are
unremarkable. ETT is reposition now 3 cm above the level of the
carina. NG tube is seen coiled within the mid body stomach.
IMPRESSION: Reposition ETT 3 cm above the carina.

NG tube within the stomach.

## 2020-05-16 IMAGING — DX DG CHEST 1V PORT
1 series · 1 of 1 positions shown · non-contrast
Comparison: Chest radiograph dated [DATE].

CLINICAL DATA: 49-year-old male status post intubation.

EXAM:
PORTABLE CHEST 1 VIEW

[chest]
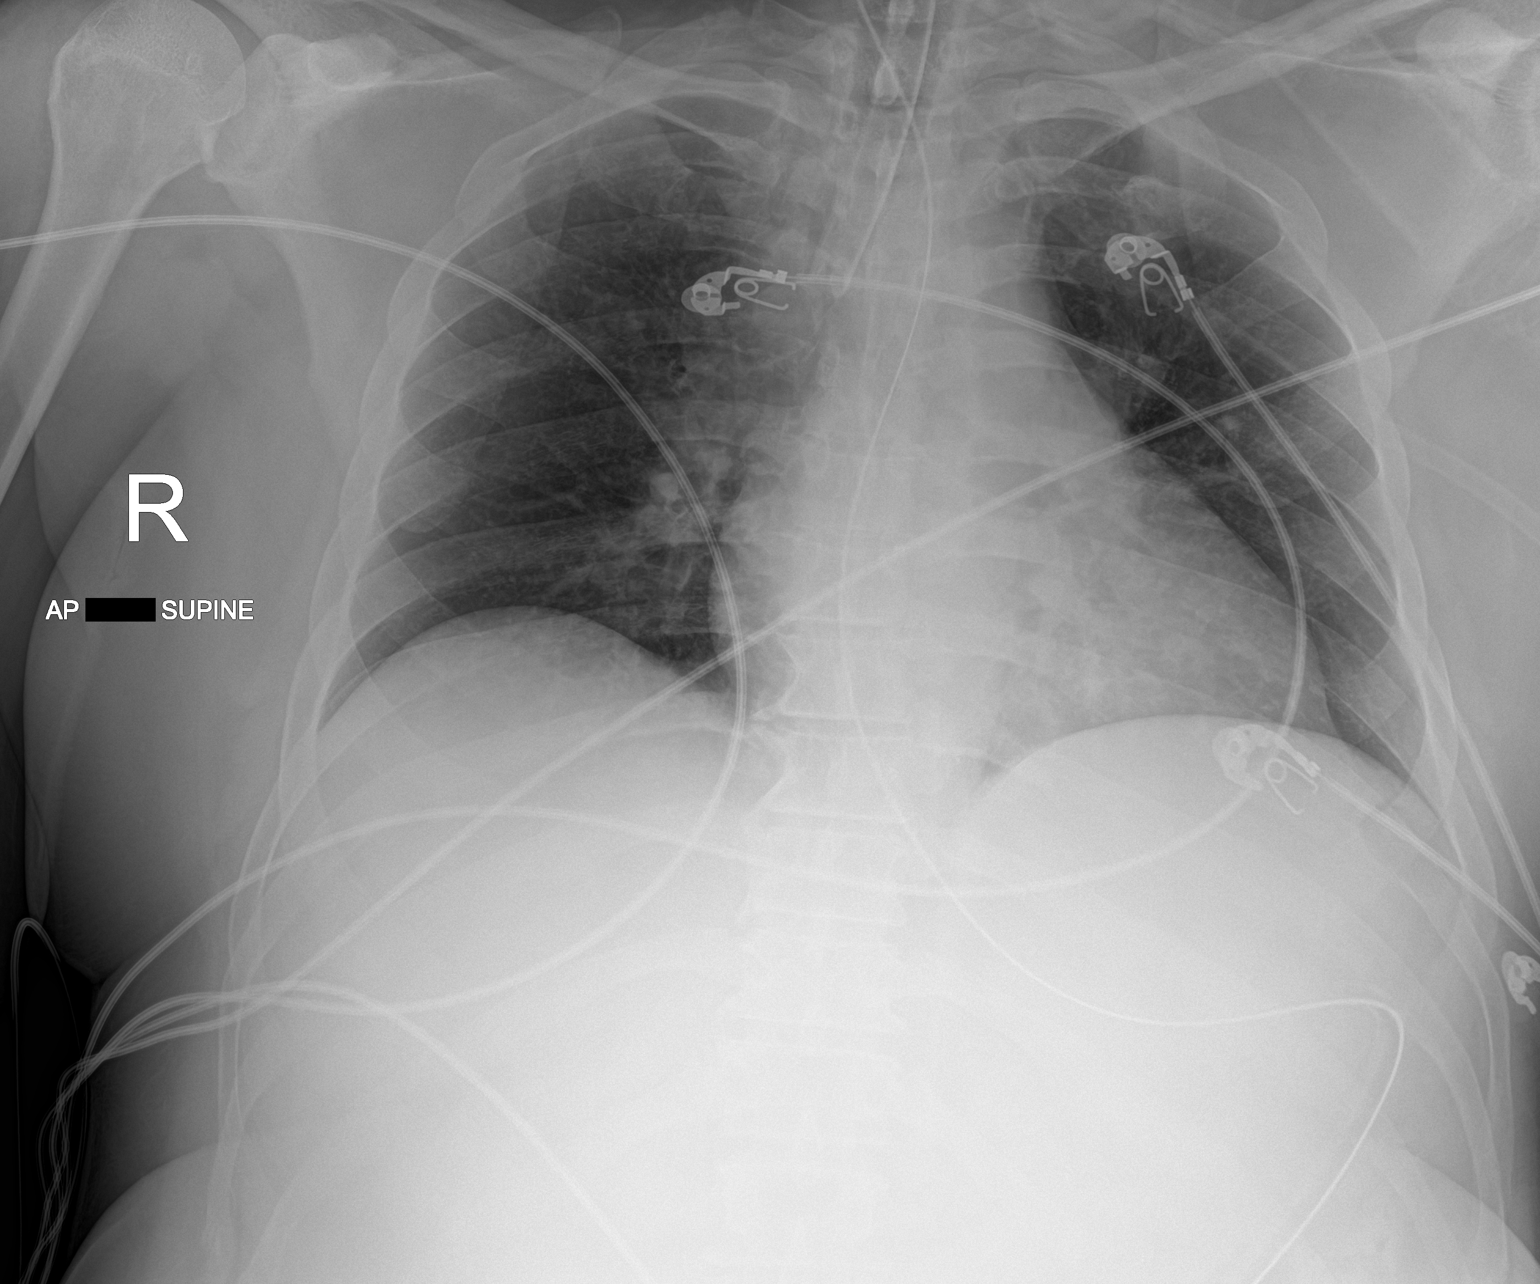

[1 of 1 positions shown; findings below may reference images not displayed]

FINDINGS: An endotracheal tube is noted with tip approximately 2 cm above the
carina tilting towards the right mainstem bronchus. Recommend
retraction by approximately 2 cm for optimal positioning. Enteric
tube extends below the diaphragm with tip beyond the inferior margin
of the image.

There is shallow inspiration. No focal consolidation, pleural
effusion, pneumothorax. The cardiac silhouette is within limits. No
acute osseous pathology.
IMPRESSION: Endotracheal tube with tip above the carina tilting towards the
right mainstem bronchus. Recommend retraction by approximately 2 cm
for optimal positioning.

## 2020-05-16 MED ORDER — DIPHENHYDRAMINE HCL 50 MG/ML IJ SOLN: 25.0000 mg | INTRAMUSCULAR | Status: DC | PRN

## 2020-05-16 MED ORDER — MIDAZOLAM HCL 2 MG/2ML IJ SOLN: 2.0000 mg | INTRAMUSCULAR | Status: DC | PRN

## 2020-05-16 MED ORDER — SODIUM CHLORIDE 0.9 % IV SOLN: 10.0000 mL/h | Freq: Once | INTRAVENOUS | Status: DC

## 2020-05-16 MED ORDER — FENTANYL 2500MCG IN NS 250ML (10MCG/ML) PREMIX INFUSION
0.0000 ug/h | INTRAVENOUS | Status: DC
  Administered 2020-05-16: 50 ug/h via INTRAVENOUS
  Filled 2020-05-16: qty 250

## 2020-05-16 MED ORDER — DOCUSATE SODIUM 100 MG PO CAPS: 100.0000 mg | ORAL_CAPSULE | Freq: Two times a day (BID) | ORAL | Status: DC | PRN

## 2020-05-16 MED ORDER — DEXTROSE 5 % IV SOLN: INTRAVENOUS | Status: DC

## 2020-05-16 MED ORDER — NOREPINEPHRINE 4 MG/250ML-% IV SOLN
0.0000 ug/min | INTRAVENOUS | Status: DC
  Administered 2020-05-16: 5 ug/min via INTRAVENOUS

## 2020-05-16 MED ORDER — CALCIUM CHLORIDE 10 % IV SOLN
INTRAVENOUS | Status: AC | PRN
  Administered 2020-05-16: 1 g via INTRAVENOUS

## 2020-05-16 MED ORDER — SODIUM BICARBONATE 8.4 % IV SOLN
Freq: Once | INTRAVENOUS | Status: DC
  Filled 2020-05-16: qty 100

## 2020-05-16 MED ORDER — POLYETHYLENE GLYCOL 3350 17 G PO PACK: 17.0000 g | PACK | Freq: Every day | ORAL | Status: DC | PRN

## 2020-05-16 MED ORDER — GLYCOPYRROLATE 0.2 MG/ML IJ SOLN: 0.2000 mg | INTRAMUSCULAR | Status: DC | PRN

## 2020-05-16 MED ORDER — SUCCINYLCHOLINE CHLORIDE 20 MG/ML IJ SOLN
INTRAMUSCULAR | Status: AC | PRN
  Administered 2020-05-16: 100 mg via INTRAVENOUS

## 2020-05-16 MED ORDER — SODIUM BICARBONATE 8.4 % IV SOLN
50.0000 meq | Freq: Once | INTRAVENOUS | Status: AC
  Filled 2020-05-16: qty 50

## 2020-05-16 MED ORDER — SODIUM BICARBONATE 8.4 % IV SOLN
INTRAVENOUS | Status: AC
  Administered 2020-05-16: 50 meq via INTRAVENOUS
  Filled 2020-05-16: qty 100

## 2020-05-16 MED ORDER — PANTOPRAZOLE SODIUM 40 MG IV SOLR: 40.0000 mg | Freq: Every day | INTRAVENOUS | Status: DC

## 2020-05-16 MED ORDER — FENTANYL CITRATE (PF) 100 MCG/2ML IJ SOLN
50.0000 ug | Freq: Once | INTRAMUSCULAR | Status: AC
  Administered 2020-05-16: 50 ug via INTRAVENOUS

## 2020-05-16 MED ORDER — MORPHINE SULFATE (PF) 4 MG/ML IV SOLN: 4.0000 mg | INTRAVENOUS | Status: DC | PRN

## 2020-05-16 MED ORDER — SODIUM BICARBONATE 8.4 % IV SOLN
INTRAVENOUS | Status: DC | PRN
  Administered 2020-05-16: 50 meq via INTRAVENOUS

## 2020-05-16 MED ORDER — POLYVINYL ALCOHOL 1.4 % OP SOLN: 1.0000 [drp] | Freq: Four times a day (QID) | OPHTHALMIC | Status: DC | PRN

## 2020-05-16 MED ORDER — GLYCOPYRROLATE 1 MG PO TABS
1.0000 mg | ORAL_TABLET | ORAL | Status: DC | PRN
  Filled 2020-05-16: qty 1

## 2020-05-16 MED ORDER — CALCIUM GLUCONATE-NACL 1-0.675 GM/50ML-% IV SOLN
INTRAVENOUS | Status: AC
  Administered 2020-05-16: 1000 mg via INTRAVENOUS
  Filled 2020-05-16: qty 50

## 2020-05-16 MED ORDER — ETOMIDATE 2 MG/ML IV SOLN
INTRAVENOUS | Status: AC | PRN
  Administered 2020-05-16: 20 mg via INTRAVENOUS

## 2020-05-16 MED ORDER — ALBUTEROL SULFATE (2.5 MG/3ML) 0.083% IN NEBU
10.0000 mg | INHALATION_SOLUTION | Freq: Once | RESPIRATORY_TRACT | Status: AC
  Administered 2020-05-16: 10 mg via RESPIRATORY_TRACT
  Filled 2020-05-16: qty 12

## 2020-05-16 MED ORDER — CALCIUM GLUCONATE-NACL 1-0.675 GM/50ML-% IV SOLN: 1.0000 g | Freq: Once | INTRAVENOUS | Status: AC

## 2020-05-16 MED ORDER — INSULIN ASPART 100 UNIT/ML IV SOLN
10.0000 [IU] | Freq: Once | INTRAVENOUS | Status: AC
  Administered 2020-05-16: 10 [IU] via INTRAVENOUS

## 2020-05-16 MED ORDER — DEXTROSE 50 % IV SOLN
1.0000 | Freq: Once | INTRAVENOUS | Status: AC
  Administered 2020-05-16: 50 mL via INTRAVENOUS
  Filled 2020-05-16: qty 50

## 2020-05-16 MED ORDER — FENTANYL BOLUS VIA INFUSION
50.0000 ug | INTRAVENOUS | Status: DC | PRN
  Filled 2020-05-16: qty 50

## 2020-05-16 MED ORDER — SODIUM CHLORIDE 0.9 % IV BOLUS
1000.0000 mL | Freq: Once | INTRAVENOUS | Status: AC
  Administered 2020-05-16: 1000 mL via INTRAVENOUS

## 2020-05-17 ENCOUNTER — Ambulatory Visit: Payer: BC Managed Care – PPO

## 2020-05-17 LAB — TYPE AND SCREEN
ABO/RH(D): A POS
Antibody Screen: NEGATIVE
Unit division: 0
Unit division: 0

## 2020-05-17 LAB — BPAM RBC
Blood Product Expiration Date: 202112032359
Blood Product Expiration Date: 202112032359
Unit Type and Rh: 6200
Unit Type and Rh: 6200

## 2020-05-18 ENCOUNTER — Ambulatory Visit: Payer: BC Managed Care – PPO

## 2020-05-19 LAB — URINE CULTURE: Culture: NO GROWTH

## 2020-05-20 ENCOUNTER — Inpatient Hospital Stay: Payer: BC Managed Care – PPO

## 2020-05-20 ENCOUNTER — Inpatient Hospital Stay: Payer: BC Managed Care – PPO | Admitting: Oncology

## 2020-05-21 LAB — CULTURE, BLOOD (ROUTINE X 2)
Culture: NO GROWTH
Culture: NO GROWTH
Special Requests: ADEQUATE

## 2020-06-01 NOTE — Procedures (Signed)
Extubation Procedure Note  Patient Details:   Name: Marcus Callahan DOB: [birth year] MRN: [id number]   Airway Documentation:    Vent end date: (not recorded) Vent end time: (not recorded)   Evaluation  O2 sats: currently acceptable Complications: No apparent complications Patient did tolerate procedure well. Bilateral Breath Sounds: Clear   No  Sherrina Zaugg 06-05-20, 7:37 PM  Pt terminally extubated at this time per family and MD.

## 2020-06-01 NOTE — Progress Notes (Signed)
Spoke with mother and sister at bedside.  Patient condition deteriorating rapidly.  pH remains < 6.9 with high MV on vent and multiple pushes of bicarb.  Pressors are precipitously going up.  Underlying terminal cancer diagnosis reviewed with family, lab findings, and options going forward: (A) aggressive care including emergent hemodialysis with likely death regardless without being able to cure the underlying cancer or (B) allowing patient to die on his own terms.  Family would like Marcus Callahan to be allowed to pass in peace.  Awaiting remaining family then will turn off pressors.  Fentanyl will be increased.  Erskine Emery MD PCCM

## 2020-06-01 NOTE — ED Notes (Signed)
2 RN ([place] Delauder and Sammuel Bailiff) pronounced death at [phone]. Pulses absent and no audible heart sounds.

## 2020-06-01 NOTE — ED Notes (Signed)
Critical Lab: >11 lactic  Dr. Karle Starch made aware  At bedside

## 2020-06-01 NOTE — Progress Notes (Signed)
PCCM Brief Interval Note   49 yo M with MODS in setting of underlying terminal cancer diagnosis. Intubated, on pressors. Family compassionately transitioning to comfort care.  On arrival to room, RR on vent decreased to 16. Patient overbreathing vent to 20.   I discussed with family comfort care with and without ETT in place. Family in agreement to compassionately liberate from ventialtor.  P -extubate -liberal analgesia, anxiolysis -family at bedside -comfort care -DNR     Eliseo Gum MSN, AGACNP-BC [facility]  [phone] 06/02/2020, 7:34 PM

## 2020-06-01 NOTE — ED Notes (Signed)
Family at bedside. 

## 2020-06-01 NOTE — Death Summary Note (Signed)
DEATH SUMMARY   Patient Details  Name: Marcus Callahan MRN: [id number] DOB: January 13, 1971  Admission/Discharge Information   Admit Date:  05/28/20  Date of Death:   05/28/20  Time of Death:  1940  Length of Stay: 0  Referring Physician: [name], MD   Reason(s) for Hospitalization  Marcus Callahan, multisystem organ failure, due to metastatic cancer   Diagnoses  Preliminary cause of death: Metastatic cancer Secondary Diagnoses (including complications and co-morbidities):  Acute renal failure with hyperkalemia Respiratory failure  Metabolic acidosis Lactic Acidosis  Anemia Shock  Active Problems:   Renal failure   Liver failure [facility])   [facility] Course (including significant findings, care, treatment, and services provided and events leading to death)  Marcus Callahan is a 49 y.o. year old male who presented to the ED 05-28-2020 with weakness and altered mental status in the setting of metastatic renal cell carcinoma (mets to brain, liver) currently undergoing palliative whole brain radiation. In the ED, the patient was intubated for airway protection. Labs reveal acute kidney failure with hyperkalemia, elevated liver enzymes, profound acidosis and elevated lactic acid- receiving bicarb, calcium, insulin/dextrose and albuterol. Patient was started on levophed for hypotension, and critical care was consulted. After discussion with family, the family compassionately decided to pursue comfort based care. Upon family's arrival to bedside, supportive medications were titrated off and the patient was liberated from mechanical ventilation for palliative symptom management at end of life. The patient died peacefully. Time of death 14.    Pertinent Labs and Studies  Significant Diagnostic Studies MR Brain W and Wo Contrast  Result Date: 04/22/2020 CLINICAL DATA:  Brain mass or lesion.  Right leg weakness. EXAM: MRI HEAD WITHOUT AND WITH CONTRAST TECHNIQUE:  Multiplanar, multiecho pulse sequences of the brain and surrounding structures were obtained without and with intravenous contrast. CONTRAST:  7mL GADAVIST GADOBUTROL 1 MMOL/ML IV SOLN COMPARISON:  None. FINDINGS: Brain: At least 34 brain lesions with hemorrhagic features, consistent with metastatic disease from the patient's renal cell carcinoma. These are marked on postcontrast series 18. Some of these are avidly enhancing, others are best seen on gradient imaging as areas of chronic blood products. A few of the lesions show T1 hyperintensity for subacute hemorrhage and methemoglobin, especially along the low left parietal lobe on 16:33, measuring 13 mm. Lesions measure up to 16 mm in maximal span (measured in the inferior left cerebrum). There are scattered areas of moderate vasogenic edema without midline shift or herniation. Two lesions are seen at the level of the right lateral ventricular choroid, mainly as areas of prior hemorrhage. No superimposed infarct, hydrocephalus, or collection. Vascular: Normal flow voids. Skull and upper cervical spine: No focal marrow lesion. Sinuses/Orbits: Negative IMPRESSION: Multiple hemorrhagic brain masses consistent with metastatic disease in this patient with history of renal cell carcinoma. At least 34 lesions are seen. Up to moderate vasogenic edema without midline shift or herniation. Electronically Signed   By: Monte Fantasia M.D.   On: 04/22/2020 06:55   MR CERVICAL SPINE W WO CONTRAST  Result Date: 04/22/2020 CLINICAL DATA:  Initial evaluation for right lower extremity weakness, history of metastatic renal cell carcinoma. EXAM: MRI CERVICAL, THORACIC AND LUMBAR SPINE WITHOUT AND WITH CONTRAST TECHNIQUE: Multiplanar and multiecho pulse sequences of the cervical spine, to include the craniocervical junction and cervicothoracic junction, and thoracic and lumbar spine, were obtained without and with intravenous contrast. CONTRAST:  15mL GADAVIST GADOBUTROL 1  MMOL/ML IV SOLN COMPARISON:  Prior brain MRI from earlier  the same day. FINDINGS: MRI CERVICAL SPINE FINDINGS Alignment: Examination degraded by motion artifact. Straightening of the normal cervical lordosis.  No listhesis. Vertebrae: Vertebral body height maintained without acute or chronic fracture. Bone marrow signal intensity normal. No focal marrow replacing lesion. No abnormal marrow edema or enhancement. Cord: Normal signal and morphology. No abnormal enhancement. No epidural mass or tumor. Posterior Fossa, vertebral arteries, paraspinal tissues: Multiple hemorrhagic brain metastases partially visualize, better evaluated on prior brain MRI. Craniocervical junction normal. Paraspinous and prevertebral soft tissues within normal limits. Normal flow voids seen within the vertebral arteries bilaterally. Disc levels: No significant disc pathology seen within the cervical spine. No significant spinal stenosis. Foramina remain patent. MRI THORACIC SPINE FINDINGS Alignment:  Examination degraded by motion artifact. Vertebral bodies normally aligned with preservation of the normal thoracic kyphosis. No listhesis. Vertebrae: Vertebral body height maintained without acute or chronic fracture. Bone marrow signal intensity within normal limits. No discrete or worrisome osseous lesions. No abnormal marrow edema or enhancement. Cord: Normal signal and morphology. No abnormal enhancement. No epidural tumor. Paraspinal and other soft tissues: Negative. Visceral structures better evaluated on prior cross-sectional imaging. Disc levels: No significant disc pathology seen within the thoracic spine. No disc bulge or focal disc herniation. No stenosis or neural impingement. MRI LUMBAR SPINE FINDINGS Segmentation:  Examination degraded by motion artifact. Standard segmentation. Lowest well-formed disc space labeled the L5-S1 level. Alignment: Physiologic with preservation of the normal lumbar lordosis. No listhesis. Vertebrae:  Vertebral body height maintained without acute or chronic fracture. Bone marrow signal intensity within normal limits. No discrete or worrisome osseous lesions. No abnormal marrow edema or enhancement. Conus medullaris and cauda equina: Conus extends to the T12-L1 level. Conus and cauda equina appear normal. No abnormal enhancement. No epidural tumor. Paraspinal and other soft tissues: Paraspinous soft tissues within normal limits. Left kidney appears to be absent. Disc levels: L4-5: Diffuse disc bulge with disc desiccation. Superimposed shallow central disc protrusion with annular fissure. No spinal stenosis. Foramina remain patent. L5-S1: Diffuse disc bulge with disc desiccation. Mild reactive endplate change. Superimposed small central disc protrusion indents the ventral thecal sac. No significant spinal stenosis. Mild bilateral L5 foraminal narrowing. No impingement. IMPRESSION: 1. Normal MRI appearance of the cervical, thoracic, and lumbar spine. No evidence for metastatic disease. No other findings to explain patient's symptoms identified. 2. Mild lower lumbar degenerative disc disease without significant stenosis or neural impingement. Electronically Signed   By: Jeannine Boga M.D.   On: 04/22/2020 20:13   MR THORACIC SPINE W WO CONTRAST  Result Date: 04/22/2020 CLINICAL DATA:  Initial evaluation for right lower extremity weakness, history of metastatic renal cell carcinoma. EXAM: MRI CERVICAL, THORACIC AND LUMBAR SPINE WITHOUT AND WITH CONTRAST TECHNIQUE: Multiplanar and multiecho pulse sequences of the cervical spine, to include the craniocervical junction and cervicothoracic junction, and thoracic and lumbar spine, were obtained without and with intravenous contrast. CONTRAST:  49mL GADAVIST GADOBUTROL 1 MMOL/ML IV SOLN COMPARISON:  Prior brain MRI from earlier the same day. FINDINGS: MRI CERVICAL SPINE FINDINGS Alignment: Examination degraded by motion artifact. Straightening of the normal  cervical lordosis.  No listhesis. Vertebrae: Vertebral body height maintained without acute or chronic fracture. Bone marrow signal intensity normal. No focal marrow replacing lesion. No abnormal marrow edema or enhancement. Cord: Normal signal and morphology. No abnormal enhancement. No epidural mass or tumor. Posterior Fossa, vertebral arteries, paraspinal tissues: Multiple hemorrhagic brain metastases partially visualize, better evaluated on prior brain MRI. Craniocervical junction normal. Paraspinous and prevertebral  soft tissues within normal limits. Normal flow voids seen within the vertebral arteries bilaterally. Disc levels: No significant disc pathology seen within the cervical spine. No significant spinal stenosis. Foramina remain patent. MRI THORACIC SPINE FINDINGS Alignment:  Examination degraded by motion artifact. Vertebral bodies normally aligned with preservation of the normal thoracic kyphosis. No listhesis. Vertebrae: Vertebral body height maintained without acute or chronic fracture. Bone marrow signal intensity within normal limits. No discrete or worrisome osseous lesions. No abnormal marrow edema or enhancement. Cord: Normal signal and morphology. No abnormal enhancement. No epidural tumor. Paraspinal and other soft tissues: Negative. Visceral structures better evaluated on prior cross-sectional imaging. Disc levels: No significant disc pathology seen within the thoracic spine. No disc bulge or focal disc herniation. No stenosis or neural impingement. MRI LUMBAR SPINE FINDINGS Segmentation:  Examination degraded by motion artifact. Standard segmentation. Lowest well-formed disc space labeled the L5-S1 level. Alignment: Physiologic with preservation of the normal lumbar lordosis. No listhesis. Vertebrae: Vertebral body height maintained without acute or chronic fracture. Bone marrow signal intensity within normal limits. No discrete or worrisome osseous lesions. No abnormal marrow edema or  enhancement. Conus medullaris and cauda equina: Conus extends to the T12-L1 level. Conus and cauda equina appear normal. No abnormal enhancement. No epidural tumor. Paraspinal and other soft tissues: Paraspinous soft tissues within normal limits. Left kidney appears to be absent. Disc levels: L4-5: Diffuse disc bulge with disc desiccation. Superimposed shallow central disc protrusion with annular fissure. No spinal stenosis. Foramina remain patent. L5-S1: Diffuse disc bulge with disc desiccation. Mild reactive endplate change. Superimposed small central disc protrusion indents the ventral thecal sac. No significant spinal stenosis. Mild bilateral L5 foraminal narrowing. No impingement. IMPRESSION: 1. Normal MRI appearance of the cervical, thoracic, and lumbar spine. No evidence for metastatic disease. No other findings to explain patient's symptoms identified. 2. Mild lower lumbar degenerative disc disease without significant stenosis or neural impingement. Electronically Signed   By: Jeannine Boga M.D.   On: 04/22/2020 20:13   MR Lumbar Spine W Wo Contrast  Result Date: 04/22/2020 CLINICAL DATA:  Initial evaluation for right lower extremity weakness, history of metastatic renal cell carcinoma. EXAM: MRI CERVICAL, THORACIC AND LUMBAR SPINE WITHOUT AND WITH CONTRAST TECHNIQUE: Multiplanar and multiecho pulse sequences of the cervical spine, to include the craniocervical junction and cervicothoracic junction, and thoracic and lumbar spine, were obtained without and with intravenous contrast. CONTRAST:  47mL GADAVIST GADOBUTROL 1 MMOL/ML IV SOLN COMPARISON:  Prior brain MRI from earlier the same day. FINDINGS: MRI CERVICAL SPINE FINDINGS Alignment: Examination degraded by motion artifact. Straightening of the normal cervical lordosis.  No listhesis. Vertebrae: Vertebral body height maintained without acute or chronic fracture. Bone marrow signal intensity normal. No focal marrow replacing lesion. No  abnormal marrow edema or enhancement. Cord: Normal signal and morphology. No abnormal enhancement. No epidural mass or tumor. Posterior Fossa, vertebral arteries, paraspinal tissues: Multiple hemorrhagic brain metastases partially visualize, better evaluated on prior brain MRI. Craniocervical junction normal. Paraspinous and prevertebral soft tissues within normal limits. Normal flow voids seen within the vertebral arteries bilaterally. Disc levels: No significant disc pathology seen within the cervical spine. No significant spinal stenosis. Foramina remain patent. MRI THORACIC SPINE FINDINGS Alignment:  Examination degraded by motion artifact. Vertebral bodies normally aligned with preservation of the normal thoracic kyphosis. No listhesis. Vertebrae: Vertebral body height maintained without acute or chronic fracture. Bone marrow signal intensity within normal limits. No discrete or worrisome osseous lesions. No abnormal marrow edema or enhancement. Cord:  Normal signal and morphology. No abnormal enhancement. No epidural tumor. Paraspinal and other soft tissues: Negative. Visceral structures better evaluated on prior cross-sectional imaging. Disc levels: No significant disc pathology seen within the thoracic spine. No disc bulge or focal disc herniation. No stenosis or neural impingement. MRI LUMBAR SPINE FINDINGS Segmentation:  Examination degraded by motion artifact. Standard segmentation. Lowest well-formed disc space labeled the L5-S1 level. Alignment: Physiologic with preservation of the normal lumbar lordosis. No listhesis. Vertebrae: Vertebral body height maintained without acute or chronic fracture. Bone marrow signal intensity within normal limits. No discrete or worrisome osseous lesions. No abnormal marrow edema or enhancement. Conus medullaris and cauda equina: Conus extends to the T12-L1 level. Conus and cauda equina appear normal. No abnormal enhancement. No epidural tumor. Paraspinal and other soft  tissues: Paraspinous soft tissues within normal limits. Left kidney appears to be absent. Disc levels: L4-5: Diffuse disc bulge with disc desiccation. Superimposed shallow central disc protrusion with annular fissure. No spinal stenosis. Foramina remain patent. L5-S1: Diffuse disc bulge with disc desiccation. Mild reactive endplate change. Superimposed small central disc protrusion indents the ventral thecal sac. No significant spinal stenosis. Mild bilateral L5 foraminal narrowing. No impingement. IMPRESSION: 1. Normal MRI appearance of the cervical, thoracic, and lumbar spine. No evidence for metastatic disease. No other findings to explain patient's symptoms identified. 2. Mild lower lumbar degenerative disc disease without significant stenosis or neural impingement. Electronically Signed   By: Jeannine Boga M.D.   On: 04/22/2020 20:13   DG Chest Portable 1 View  Result Date: June 06, 2020 CLINICAL DATA:  ETT repositioning EXAM: PORTABLE CHEST 1 VIEW COMPARISON:  June 06, 2020 FINDINGS: The heart size and mediastinal contours are within normal limits. Both lungs are clear. The visualized skeletal structures are unremarkable. ETT is reposition now 3 cm above the level of the carina. NG tube is seen coiled within the mid body stomach. IMPRESSION: Reposition ETT 3 cm above the carina. NG tube within the stomach. Electronically Signed   By: Prudencio Pair M.D.   On: 06-06-2020 18:24   DG Chest Portable 1 View  Result Date: 06/06/20 CLINICAL DATA:  49 year old male status post intubation. EXAM: PORTABLE CHEST 1 VIEW COMPARISON:  Chest radiograph dated 09/28/2019. FINDINGS: An endotracheal tube is noted with tip approximately 2 cm above the carina tilting towards the right mainstem bronchus. Recommend retraction by approximately 2 cm for optimal positioning. Enteric tube extends below the diaphragm with tip beyond the inferior margin of the image. There is shallow inspiration. No focal consolidation,  pleural effusion, pneumothorax. The cardiac silhouette is within limits. No acute osseous pathology. IMPRESSION: Endotracheal tube with tip above the carina tilting towards the right mainstem bronchus. Recommend retraction by approximately 2 cm for optimal positioning. Electronically Signed   By: Anner Crete M.D.   On: June 06, 2020 17:25    Microbiology Recent Results (from the past 240 hour(s))  Respiratory Panel by RT PCR (Flu A&B, Covid) - Nasopharyngeal Swab     Status: None   Collection Time: 06-Jun-2020  5:14 PM   Specimen: Nasopharyngeal Swab  Result Value Ref Range Status   SARS Coronavirus 2 by RT PCR NEGATIVE NEGATIVE Final    Comment: (NOTE) SARS-CoV-2 target nucleic acids are NOT DETECTED.  The SARS-CoV-2 RNA is generally detectable in upper respiratoy specimens during the acute phase of infection. The lowest concentration of SARS-CoV-2 viral copies this assay can detect is 131 copies/mL. A negative result does not preclude SARS-Cov-2 infection and should not be used as  the sole basis for treatment or other patient management decisions. A negative result may occur with  improper specimen collection/handling, submission of specimen other than nasopharyngeal swab, presence of viral mutation(s) within the areas targeted by this assay, and inadequate number of viral copies (<131 copies/mL). A negative result must be combined with clinical observations, patient history, and epidemiological information. The expected result is Negative.  Fact Sheet for Patients:  PinkCheek.be  Fact Sheet for Healthcare Providers:  GravelBags.it  This test is no t yet approved or cleared by the [place] FDA and  has been authorized for detection and/or diagnosis of SARS-CoV-2 by FDA under an Emergency Use Authorization (EUA). This EUA will remain  in effect (meaning this test can be used) for the duration of the COVID-19 declaration  under Section 564(b)(1) of the Act, 21 U.S.C. section 360bbb-3(b)(1), unless the authorization is terminated or revoked sooner.     Influenza A by PCR NEGATIVE NEGATIVE Final   Influenza B by PCR NEGATIVE NEGATIVE Final    Comment: (NOTE) The Xpert Xpress SARS-CoV-2/FLU/RSV assay is intended as an aid in  the diagnosis of influenza from Nasopharyngeal swab specimens and  should not be used as a sole basis for treatment. Nasal washings and  aspirates are unacceptable for Xpert Xpress SARS-CoV-2/FLU/RSV  testing.  Fact Sheet for Patients: PinkCheek.be  Fact Sheet for Healthcare Providers: GravelBags.it  This test is not yet approved or cleared by the [place] FDA and  has been authorized for detection and/or diagnosis of SARS-CoV-2 by  FDA under an Emergency Use Authorization (EUA). This EUA will remain  in effect (meaning this test can be used) for the duration of the  Covid-19 declaration under Section 564(b)(1) of the Act, 21  U.S.C. section 360bbb-3(b)(1), unless the authorization is  terminated or revoked. Performed at [facility], [place] [address] [place], [place] [address]     Lab Basic Metabolic Panel: Recent Labs  Lab Jun 03, 2020 1603 June 03, 2020 1619 June 03, 2020 1821  NA 136 131* 134*  K >7.5* >8.5* 7.9*  CL 103  --   --   CO2 <7*  --   --   GLUCOSE 130*  --   --   BUN 140*  --   --   CREATININE 7.34*  --   --   CALCIUM 8.5*  --   --    Liver Function Tests: Recent Labs  Lab 06/03/2020 1603  AST 1,773*  ALT 1,932*  ALKPHOS 291*  BILITOT 0.9  PROT 5.6*  ALBUMIN 2.5*   No results for input(s): LIPASE, AMYLASE in the last 168 hours. Recent Labs  Lab 06-03-2020 1634  AMMONIA 84*   CBC: Recent Labs  Lab 06/03/2020 1603 06-03-2020 1619 06-03-2020 1821  WBC 21.5*  --   --   NEUTROABS 17.7*  --   --   HGB 6.8* 7.1* 5.1*  HCT 24.1* 21.0* 15.0*  MCV 86.7  --   --   PLT 65*  --   --    Cardiac  Enzymes: No results for input(s): CKTOTAL, CKMB, CKMBINDEX, TROPONINI in the last 168 hours. Sepsis Labs: Recent Labs  Lab 03-Jun-2020 1603  WBC 21.5*  LATICACIDVEN >11.0*    Procedures/Operations  06/03/20 ETT     Eliseo Gum MSN, AGACNP-BC [place] [phone] 2020/06/03, 8:03 PM

## 2020-06-01 NOTE — Telephone Encounter (Signed)
Faxed certification and plan of care to [facility]. Confirmation page 5:27 pm.

## 2020-06-01 NOTE — ED Notes (Signed)
Lab at bedside

## 2020-06-01 NOTE — ED Notes (Signed)
Extubated at [facility]

## 2020-06-01 NOTE — Progress Notes (Signed)
   Jun 05, 2020 1630  Clinical Encounter Type  Visited With Family  Visit Type ED  Referral From Nurse  Consult/Referral To Chaplain  Chaplain responded. Nurse Jinny Blossom stated the patient is being intubated and his sister, Joelene Millin is crying in Consultation A. The patient's mother Thelma Barge arrived. Chaplain was present when PA. Rosemarie Ax gave family an update on the patient. Mrs. Andree Elk said another son died at the age of 47, near the holiday at [facility]. She said it will hurt if the patient passes but she knows he has suffered a lot and does not want him to be in pain. Chaplain offered comfort, presence, and prayer. This note was prepared by Jeanine Luz, M.Div..  For questions please contact by phone [phone].

## 2020-06-01 NOTE — H&P (Signed)
NAME:  Marcus Callahan, MRN:  [id number], DOB:  [birth year], LOS: 0 ADMISSION DATE:  06-14-20, CONSULTATION DATE:  2020/06/14 REFERRING MD:  EDP, CHIEF COMPLAINT:  weakness   Brief History   49 y.o. M with PMH of RCC with brain and liver mets currently undergoing whole brain radiation who presented with AMS, fall and weakness.  Intubated for airway protection and found to have renal failure with hyperkalemia  History of present illness    Marcus Callahan is a 49 year old male with past medical history of renal cell carcinoma 21 diagnosed 08/2019 s/p left nephrectomy and hepatic and brain metastases currently undergoing radiation/chemotherapy who presented with altered mental status and fall at home.  He was intubated for airway protection.  Labs revealed acute renal failure with a creatinine of 7.3 above baseline of 1.6-1.9 with potassium greater than 7.5 and severe acidosis with pH of 6.8 along with hemoglobin of 6.8 and white blood cell count of 21k.  CT head and C-spine are pending.  Family confirmed that patient is full code and would want aggressive care.  PCCM consulted for admission.  Past Medical History   has a past medical history of Allergy, Anxiety, Cancer ([place]), Chronic kidney disease, and Hypertension.   Significant Hospital Events   11/15 admit to PCCM  Consults:  Nephrology  Procedures:  11/15 ETT  Significant Diagnostic Tests:  11/15 CXR>> no focal consolidations or effusions 11/15 CT head and C-spine>>  Micro Data:  11/15 Covid-19 and influenza>> 11/15 blood cultures x2  Antimicrobials:    Interim history/subjective:  As above  Objective   Blood pressure (!) 92/32, pulse (!) 106, temperature (!) 94.1 F (34.5 C), resp. rate (!) 30, height 5\' 10"  (1.778 m), weight 101.2 kg, SpO2 100 %.    Vent Mode: PRVC FiO2 (%):  [100 %] 100 % Set Rate:  [30 bmp] 30 bmp Vt Set:  [580 mL] 580 mL PEEP:  [5 cmH20] 5 cmH20 Plateau Pressure:  [18 cmH20] 18 cmH20    Intake/Output Summary (Last 24 hours) at Jun 14, 2020 1817 Last data filed at June 14, 2020 1801 Gross per 24 hour  Intake 1019.6 ml  Output 190 ml  Net 829.6 ml   Filed Weights   2020/06/14 1606  Weight: 101.2 kg    Unresponsive man on ventilator C collar in place Lungs clear, passive on vent Remains heavily sedated Ext cool to touch MAP in 30s-40s despite levophed increases  PH 6.9  Resolved Hospital Problem list     Assessment & Plan:    Metastatic renal cell carcinoma with known hemorrhagic brain metastases and hepatic involvement presenting with multiorgan failure and rapidly declining clinical status. Given underlying terminal cancer and current presentation, family opted to forego further aggressive care.  Chaplain available and comfort measures are in place.  Awaiting final family then will turn off levophed and allow to pass in ER.  No further escalation of care.  Fentanyl gtt with morphine pushes as needed for air hunger.   Labs   CBC: Recent Labs  Lab June 14, 2020 1603 06/14/20 1619  WBC 21.5*  --   NEUTROABS 17.7*  --   HGB 6.8* 7.1*  HCT 24.1* 21.0*  MCV 86.7  --   PLT 65*  --     Basic Metabolic Panel: Recent Labs  Lab June 14, 2020 1603 Jun 14, 2020 1619  NA 136 131*  K >7.5* >8.5*  CL 103  --   CO2 <7*  --   GLUCOSE 130*  --   BUN 140*  --  CREATININE 7.34*  --   CALCIUM 8.5*  --    GFR: Estimated Creatinine Clearance: 14.2 mL/min (A) (by C-G formula based on SCr of 7.34 mg/dL (H)). Recent Labs  Lab May 25, 2020 1603  WBC 21.5*  LATICACIDVEN >11.0*    Liver Function Tests: Recent Labs  Lab May 25, 2020 1603  AST 1,773*  ALT 1,932*  ALKPHOS 291*  BILITOT 0.9  PROT 5.6*  ALBUMIN 2.5*   No results for input(s): LIPASE, AMYLASE in the last 168 hours. Recent Labs  Lab 05/25/2020 1634  AMMONIA 84*    ABG    Component Value Date/Time   HCO3 3.6 (L) 05-25-2020 1619   TCO2 <5 (L) 25-May-2020 1619   ACIDBASEDEF 28.0 (H) 2020-05-25 1619   O2SAT  80.0 05/25/2020 1619     Coagulation Profile: No results for input(s): INR, PROTIME in the last 168 hours.  Cardiac Enzymes: No results for input(s): CKTOTAL, CKMB, CKMBINDEX, TROPONINI in the last 168 hours.  HbA1C: No results found for: HGBA1C  CBG: Recent Labs  Lab 05/25/2020 1604 05-25-20 1813  GLUCAP 95 155*    Review of Systems:   Unable to obtain  Past Medical History  He,  has a past medical history of Allergy, Anxiety, Cancer ([place]), Chronic kidney disease, and Hypertension.   Surgical History    Past Surgical History:  Procedure Laterality Date  . CYSTOSCOPY N/A 08/26/2019   Procedure: Erlene Quan;  Surgeon: Ceasar Mons, MD;  Location: [facility];  Service: Urology;  Laterality: N/A;  . ROBOT ASSISTED LAPAROSCOPIC NEPHRECTOMY Left 08/26/2019   Procedure: XI ROBOTIC ASSISTED LAPAROSCOPIC NEPHRECTOMY;  Surgeon: Ceasar Mons, MD;  Location: [facility];  Service: Urology;  Laterality: Left;  . surgery as a child     thinks matbe was a hernia repair as a premature baby      Social History   reports that he has never smoked. He has never used smokeless tobacco. He reports that he does not drink alcohol and does not use drugs.   Family History   His family history includes Colon polyps in his mother. There is no history of Colon cancer, Esophageal cancer, Prostate cancer, or Rectal cancer.   Allergies Allergies  Allergen Reactions  . Codeine Other (See Comments)    Nose bleeds     Home Medications  Prior to Admission medications   Medication Sig Start Date End Date Taking? Authorizing Provider  acetaminophen (TYLENOL) 500 MG tablet Take 1,000 mg by mouth every 6 (six) hours as needed for mild pain.    [provider]  amLODipine (NORVASC) 10 MG tablet Take 1 tablet (10 mg total) by mouth daily. 11/23/19 06/15/20  [name], MD  dexamethasone (DECADRON) 4 MG tablet Take 1 tablet (4 mg total) by mouth every 6 (six)  hours. 04/23/20   Little Ishikawa, MD  lisinopril-hydrochlorothiazide (ZESTORETIC) 20-25 MG tablet TAKE 1 TABLET BY MOUTH DAILY 10/31/19   [name], MD  metoprolol succinate (TOPROL-XL) 100 MG 24 hr tablet Take 1 tablet (100 mg total) by mouth daily. Take with or immediately following a meal. 11/23/19 04/22/20  [name], MD  Multiple Vitamin (MULTIVITAMIN) LIQD Take 5 mLs by mouth daily.     [provider]  omeprazole (PRILOSEC) 20 MG capsule Take 1 capsule (20 mg total) by mouth daily. 04/26/20   Eppie Gibson, MD     Critical care time: 34 minutes

## 2020-06-01 NOTE — ED Provider Notes (Addendum)
[facility] Provider Note   CSN: [id number] Arrival date & time: 06-10-20  1548     History Chief Complaint  Patient presents with  . Altered Mental Status    Marcus Callahan is a 49 y.o. male with history of left renal cell carcinoma diagnosed February 2021 with hepatic and CNS metastasis s/p left radical nephrectomy now on chemotherapy and palliative radiation therapy brought to ER by EMS for evaluation of fall and weakness.  Level 5 caveat due to acuity of condition. Patient on NRB on arrival, somnolent providing brief short answers.  Still able to tell me his full name, his height and weight. States he just fell today, did not trip. Did not hurt anything. No pain anywhere. Denies headache, chest pain, shortness of breath. No recent vomiting, diarrhea, abdominal pain or dysuria. States has not been eating or drinking much. Per EMS family called 42.  Fire found patient minimally responsive.  Per EMS patient had very shallow slow respirations, placed on pads and Lucas. CBG in the 90s en route. No medications given en route. No other information available at this time from EMS.   HPI     Past Medical History:  Diagnosis Date  . Allergy    no diagnosis per pt   . Anxiety   . Cancer ([place])   . Chronic kidney disease   . Hypertension     Patient Active Problem List   Diagnosis Date Noted  . Renal failure 06/10/20  . Brain metastases ([place]) 04/22/2020  . Malignant neoplasm metastatic to brain ([place])   . Right leg weakness   . Palliative care by specialist   . Full code status   . Encounter for hospice care discussion   . Goals of care, counseling/discussion 09/08/2019  . Kidney cancer, primary, with metastasis from kidney to other site [facility]) 09/08/2019  . Renal mass 08/26/2019  . Essential hypertension 11/18/2018    Past Surgical History:  Procedure Laterality Date  . CYSTOSCOPY N/A 08/26/2019   Procedure: Erlene Quan;   Surgeon: Ceasar Mons, MD;  Location: [facility];  Service: Urology;  Laterality: N/A;  . ROBOT ASSISTED LAPAROSCOPIC NEPHRECTOMY Left 08/26/2019   Procedure: XI ROBOTIC ASSISTED LAPAROSCOPIC NEPHRECTOMY;  Surgeon: Ceasar Mons, MD;  Location: [facility];  Service: Urology;  Laterality: Left;  . surgery as a child     thinks matbe was a hernia repair as a premature baby        Family History  Problem Relation Age of Onset  . Colon polyps Mother   . Colon cancer Neg Hx   . Esophageal cancer Neg Hx   . Prostate cancer Neg Hx   . Rectal cancer Neg Hx     Social History   Tobacco Use  . Smoking status: Never Smoker  . Smokeless tobacco: Never Used  Vaping Use  . Vaping Use: Never used  Substance Use Topics  . Alcohol use: Never    Comment: occassionaly  . Drug use: Never    Home Medications Prior to Admission medications   Medication Sig Start Date End Date Taking? Authorizing Provider  acetaminophen (TYLENOL) 500 MG tablet Take 1,000 mg by mouth every 6 (six) hours as needed for mild pain.    [provider]  amLODipine (NORVASC) 10 MG tablet Take 1 tablet (10 mg total) by mouth daily. 11/23/19 06/15/20  [name], MD  dexamethasone (DECADRON) 4 MG tablet Take 1 tablet (4 mg total) by mouth  every 6 (six) hours. 04/23/20   Little Ishikawa, MD  lisinopril-hydrochlorothiazide (ZESTORETIC) 20-25 MG tablet TAKE 1 TABLET BY MOUTH DAILY 10/31/19   [name], MD  metoprolol succinate (TOPROL-XL) 100 MG 24 hr tablet Take 1 tablet (100 mg total) by mouth daily. Take with or immediately following a meal. 11/23/19 04/22/20  [name], MD  Multiple Vitamin (MULTIVITAMIN) LIQD Take 5 mLs by mouth daily.     [provider]  omeprazole (PRILOSEC) 20 MG capsule Take 1 capsule (20 mg total) by mouth daily. 04/26/20   Eppie Gibson, MD    Allergies    Codeine  Review of Systems   Review of Systems  Unable to perform  ROS: Acuity of condition    Physical Exam Updated Vital Signs BP (!) 92/32   Pulse (!) 106   Temp (!) 94.1 F (34.5 C)   Resp (!) 30   Ht _0  (1.778 m)   Wt 101.2 kg   SpO2 100%   BMI 32.00 kg/m   Physical Exam Vitals and nursing note reviewed.  Constitutional:      General: He is in acute distress.     Appearance: He is well-developed. He is ill-appearing and toxic-appearing.     Comments: Somnolent, opens eyes briefly for questions   HENT:     Head: Normocephalic and atraumatic.     Right Ear: External ear normal.     Left Ear: External ear normal.     Nose: Nose normal.     Mouth/Throat:     Mouth: Mucous membranes are dry.     Comments: Extremely dry lips and tacky MM. No intraoral tongue injury/bleeding  Eyes:     Conjunctiva/sclera: Conjunctivae normal.  Cardiovascular:     Rate and Rhythm: Tachycardia present.     Comments: Palpable carotid pulse. No palpable radial or DP pulses. Extremities cool. No peripheral edema  Pulmonary:     Effort: Respiratory distress present.     Comments: Tachypnic RR mid 32s en arrival. Lungs slightly diminished in lower bases, no wheezing or crackles  Abdominal:     Palpations: Abdomen is soft.     Tenderness: There is no abdominal tenderness.  Musculoskeletal:        General: No deformity. Normal range of motion.     Cervical back: Normal range of motion and neck supple.  Skin:    General: Skin is warm and dry.     Capillary Refill: Capillary refill takes less than 2 seconds.     Comments: Diaphoretic, skin cool   Neurological:     Mental Status: He is alert.     Motor: Weakness present.     Comments: Follow simple commands (open eyes, squeeze hand, move legs). Weakness in left upper/lower extremity. Limited exam due to AMS. PERRL bilaterally, pupils 2-3 mm reactive.      ED Results / Procedures / Treatments   Labs (all labs ordered are listed, but only abnormal results are displayed) Labs Reviewed  COMPREHENSIVE  METABOLIC PANEL - Abnormal; Notable for the following components:      Result Value   Potassium >7.5 (*)    CO2 <7 (*)    Glucose, Bld 130 (*)    BUN 140 (*)    Creatinine, Ser 7.34 (*)    Calcium 8.5 (*)    Total Protein 5.6 (*)    Albumin 2.5 (*)    AST 1,773 (*)    ALT 1,932 (*)    Alkaline  Phosphatase 291 (*)    GFR, Estimated 8 (*)    All other components within normal limits  CBC WITH DIFFERENTIAL/PLATELET - Abnormal; Notable for the following components:   WBC 21.5 (*)    RBC 2.78 (*)    Hemoglobin 6.8 (*)    HCT 24.1 (*)    MCH 24.5 (*)    MCHC 28.2 (*)    RDW 17.8 (*)    Platelets 65 (*)    Neutro Abs 17.7 (*)    Monocytes Absolute 1.1 (*)    Abs Immature Granulocytes 1.46 (*)    All other components within normal limits  LACTIC ACID, PLASMA - Abnormal; Notable for the following components:   Lactic Acid, Venous >11.0 (*)    All other components within normal limits  ETHANOL - Abnormal; Notable for the following components:   Alcohol, Ethyl (B) 45 (*)    All other components within normal limits  AMMONIA - Abnormal; Notable for the following components:   Ammonia 84 (*)    All other components within normal limits  I-STAT VENOUS BLOOD GAS, ED - Abnormal; Notable for the following components:   pH, Ven 6.817 (*)    pCO2, Ven 22.4 (*)    pO2, Ven 79.0 (*)    Bicarbonate 3.6 (*)    TCO2 <5 (*)    Acid-base deficit 28.0 (*)    Sodium 131 (*)    Potassium >8.5 (*)    Calcium, Ion 1.08 (*)    HCT 21.0 (*)    Hemoglobin 7.1 (*)    All other components within normal limits  CBG MONITORING, ED - Abnormal; Notable for the following components:   Glucose-Capillary 155 (*)    All other components within normal limits  TROPONIN I (HIGH SENSITIVITY) - Abnormal; Notable for the following components:   Troponin I (High Sensitivity) 31 (*)    All other components within normal limits  CULTURE, BLOOD (ROUTINE X 2)  CULTURE, BLOOD (ROUTINE X 2)  RESPIRATORY PANEL BY RT  PCR (FLU A&B, COVID)  URINE CULTURE  LACTIC ACID, PLASMA  URINALYSIS, ROUTINE W REFLEX MICROSCOPIC  RAPID URINE DRUG SCREEN, HOSP PERFORMED  CBC  BASIC METABOLIC PANEL  CBG MONITORING, ED  I-STAT CHEM 8, ED  I-STAT ARTERIAL BLOOD GAS, ED  TYPE AND SCREEN  PREPARE RBC (CROSSMATCH)  TROPONIN I (HIGH SENSITIVITY)    EKG EKG Interpretation  Date/Time:  June 10, 2020 16:00:00 EST Ventricular Rate:  95 PR Interval:    QRS Duration: 156 QT Interval:  373 QTC Calculation: 469 R Axis:   103 Text Interpretation: Sinus or ectopic atrial rhythm Nonspecific intraventricular conduction delay Borderline ST depression, inferior leads Since last tracing QRS duration has increased Confirmed by Calvert Cantor [phone]) on June 10, 2020 5:30:32 PM   Radiology DG Chest Portable 1 View  Result Date: June 10, 2020 CLINICAL DATA:  49 year old male status post intubation. EXAM: PORTABLE CHEST 1 VIEW COMPARISON:  Chest radiograph dated 09/28/2019. FINDINGS: An endotracheal tube is noted with tip approximately 2 cm above the carina tilting towards the right mainstem bronchus. Recommend retraction by approximately 2 cm for optimal positioning. Enteric tube extends below the diaphragm with tip beyond the inferior margin of the image. There is shallow inspiration. No focal consolidation, pleural effusion, pneumothorax. The cardiac silhouette is within limits. No acute osseous pathology. IMPRESSION: Endotracheal tube with tip above the carina tilting towards the right mainstem bronchus. Recommend retraction by approximately 2 cm for optimal positioning. Electronically Signed   By: Milas Hock  Radparvar M.D.   On: Jun 13, 2020 17:25    Procedures .Critical Care Performed by: Kinnie Feil, PA-C Authorized by: Kinnie Feil, PA-C   Critical care provider statement:    Critical care time (minutes):  45   Critical care was necessary to treat or prevent imminent or life-threatening deterioration of the  following conditions:  Renal failure and CNS failure or compromise   Critical care was time spent personally by me on the following activities:  Discussions with consultants, evaluation of patient's response to treatment, examination of patient, ordering and performing treatments and interventions, ordering and review of laboratory studies, ordering and review of radiographic studies, pulse oximetry, re-evaluation of patient's condition, obtaining history from patient or surrogate, review of old charts and development of treatment plan with patient or surrogate   I assumed direction of critical care for this patient from another provider in my specialty: no   Procedure Name: Intubation Date/Time: 06-13-2020 6:10 PM Performed by: Kinnie Feil, PA-C Pre-anesthesia Checklist: Patient identified, Emergency Drugs available, Suction available, Patient being monitored and Timeout performed Oxygen Delivery Method: Ambu bag Preoxygenation: Pre-oxygenation with 100% oxygen Induction Type: Rapid sequence Ventilation: Mask ventilation without difficulty Laryngoscope Size: 4 and Glidescope Nasal Tubes: Left Tube size: 7.5 mm Number of attempts: 1 Airway Equipment and Method: Video-laryngoscopy Placement Confirmation: ETT inserted through vocal cords under direct vision,  Positive ETCO2,  CO2 detector and Breath sounds checked- equal and bilateral Secured at: 23 cm Tube secured with: ETT holder Dental Injury: Bloody posterior oropharynx  Difficulty Due To: Difficulty was anticipated Future Recommendations: Recommend- induction with short-acting agent, and alternative techniques readily available      (including critical care time)  Medications Ordered in ED Medications  sodium bicarbonate injection 50 mEq (has no administration in time range)  sodium bicarbonate 100 mEq in dextrose 5 % 1,000 mL infusion ( Intravenous New Bag/Given 06-13-2020 1655)  fentaNYL 2530mg in NS 2544m(103mml)  infusion-PREMIX (75 mcg/hr Intravenous Rate/Dose Change 05/13/12/2131)  fentaNYL (SUBLIMAZE) bolus via infusion 50 mcg (has no administration in time range)  0.9 %  sodium chloride infusion (has no administration in time range)  norepinephrine (LEVOPHED) 4mg96m 250mL34mmix infusion (20 mcg/min Intravenous Rate/Dose Change 05/1511/13/21)  docusate sodium (COLACE) capsule 100 mg (has no administration in time range)  polyethylene glycol (MIRALAX / GLYCOLAX) packet 17 g (has no administration in time range)  pantoprazole (PROTONIX) injection 40 mg (has no administration in time range)  calcium gluconate 1 g/ 50 mL sodium chloride IVPB (0 g Intravenous Stopped 11/152021/12/13)  sodium chloride 0.9 % bolus 1,000 mL (0 mLs Intravenous Stopped 11/152021/12/13)  albuterol (PROVENTIL) (2.5 MG/3ML) 0.083% nebulizer solution 10 mg (10 mg Nebulization Given by Other 05/1511-13-2021)  insulin aspart (novoLOG) injection 10 Units (10 Units Intravenous Given 11/152021/12/13)    And  dextrose 50 % solution 50 mL (50 mLs Intravenous Given 05/1511/13/2021)  calcium chloride injection (1 g Intravenous Given 11/15Dec 13, 2021)  etomidate (AMIDATE) injection (20 mg Intravenous Given 05/1512-Dec-2021)  succinylcholine (ANECTINE) injection (100 mg Intravenous Given 05/1512-Dec-2021)  fentaNYL (SUBLIMAZE) injection 50 mcg (50 mcg Intravenous Given 05/1511-13-21)    ED Course  I have reviewed the triage vital signs and the nursing notes.  Pertinent labs & imaging results that were available during my care of the patient were reviewed by me and considered in my medical decision making (see chart for details).  Clinical Course as of May 16 1817  Mon 2020/05/30  1625 pH, Ven(!!): 6.817 [CG]  1625 Bicarbonate(!): 3.6 [CG]  1625 Potassium(!!): >8.5 [CG]  1625 Hemoglobin(!): 7.1 [CG]  1730 Goals of care briefly discussed with sister prior to intubation. Never had goals of care or end of life discussions.  Patient alert enough to tell  me his name, nod, follow simple commands. He told me/RN his weight and height. Somnolent however. He denies pain at this time. Explained to him he is very sick and will need a breathing tube, nodded "ok".  EDP and sister at bedside present during conversation.    [CG]  1737 Sinus or ectopic atrial rhythm Nonspecific intraventricular conduction delay Borderline ST depression, inferior leads Since last tracing QRS duration has increased Confirmed by Calvert Cantor [phone]) on 2020-05-30 5:30:32 PM  EKG 12-Lead [CG]    Clinical Course User Index [CG] Arlean Hopping   MDM Rules/Calculators/A&P                          EMR triage and nursing notes reviewed to assist with history and MDM  49 yo M presents with AMS, fall, weakness. History of renal cancer with brain and liver mets undergoing chemotherapy and palliative brain radiation. On dexamethasone taper.   On arrival patient is critically ill, concern for imminent decompensation. Diaphoretic, cool, altered, tachypnic, rapid shallow respirations. BP 103 MAP 57.   2 IV, cardiac and pulse ox monitoring. CBG 95. Chem 8, VBG. EKG. EDP called to bedside.   Initial EKG reviewed with EDP QRS is wide, new.   Ddx metabolic abnormality, acidosis given PMH. Intracranial injury given recent fall, occult infection, cancer emergency.  Labs ordered: CBC CMP lactic acid ammonia T&S ETOH UDS troponin UA respiratory panel ABG  Imaging ordered: CT head and cervical spine, CXR, EKG  1755: ER work up personally visualized and interpreted. Shared with EDP.   Labs reviewed - most consistent with acidosis from AKI, shock liver, worsening anemia from baseline. Creatinine 7.43 baseline around 1.2. AST 1773, ALT 1932, Alk phos 291.  Potassium > 7.5.  pH 6.8, CO2 22.4, bicarb 3.6, K >8.5, hemoglobin 7.1. Baseline around 8-9.  Ammonia 84, ETOH 45. Trop leak 31.   Imaging reviewed - EKG shows wide QRS. CXR confirms ETT placement, pulled back. OG  inserted, pending repeat CXR.  Pending CT head/cervical spine.  Intubation by me under EDP supervision, entomidate and rocc.   Medicines: 2 L NS IVF, calcium, bicarb bolus and gtt, insulin, albuterol 10 mg, dextrose 50%. Transient hypotension post intubation, levophed gtt. Fentanyl for sedation. Will hold off on propofol given hypotension. PRBC ordered.   Patient is tachycardic, hypotensive with lactic acidosis however at this time suspicion for infection very low, will hold off antibiotics.   1810: EDP discussed with ICU who will admit for further management. Consider emergent dialysis. Nephro consult pending. Pending repeat ABG.  Sister and mother at bedside.   Final Clinical Impression(s) / ED Diagnoses Final diagnoses:  AKI (acute kidney injury) [facility])    Rx / [place] Orders ED Discharge Orders    None         Kinnie Feil, PA-C 05/30/20 1818    Truddie Hidden, MD 05/30/20 2020

## 2020-06-01 NOTE — ED Notes (Signed)
Crtical care MD and ED Provider at bedside.

## 2020-06-01 NOTE — ED Provider Notes (Signed)
Patient seen and examined, agree with assessment and plan by APP. Patient with history of renal cell carcinoma with lung and brain mets. Here with general weakness and a fall. Denies pain. Noted to be tachypneic on arrival. Not febrile or hypotensive. EKG shows QRS widening that is new from previous concerning for metabolic derangement. CaGluc ordered empirically. Awaiting labs and imaging.   6:36 PM Initial VBG with marked acidosis and hyperkalemia. Will initiate hyperkalemia order set including additional CaCl, Bicarb amp and gtt, insulin/D50 and albuterol. Patient's sister is at bedside, she is next of kin. They have never discussed goals of care, will proceed with Full Code per patient's request. He nods yes that he understands need for aggressive care including intubation. Patient intubated by Donney Rankins, [facility] without difficulty under my direct supervision. BP was low after intubation, saline bolus and levophed initiated. Will hold off on central line placement for now with good peripherals and likely need for emergent dialysis catheter placement.   6:36 PM BP improving. Patient is anemic above baseline will order transfusion.   6:36 PM CMP confirms AKI/kidney failure with marked increase in LFTs as well. Will discuss with ICU and Nephrology.  6:36 PM Spoke with ICU team who will come evaluate and will also place dialysis access.   6:36 PM Spoke with Dr. Tamala Julian, ICU, who has evaluated the patient. He has had a discussion with family regarding dire nature of the patient's presentation and likelihood that this is a terminal event for the patient. The family would like to make the patient comfort care only. Dr. Tamala Julian will make arrangements for discontinuing drips and for terminal extubation.   .Critical Care Performed by: Truddie Hidden, MD Authorized by: Truddie Hidden, MD   Critical care provider statement:    Critical care time (minutes):  75   Critical care was necessary to treat or  prevent imminent or life-threatening deterioration of the following conditions:  Metabolic crisis   Critical care was time spent personally by me on the following activities:  Discussions with consultants, evaluation of patient's response to treatment, examination of patient, ordering and performing treatments and interventions, ordering and review of laboratory studies, ordering and review of radiographic studies, pulse oximetry, re-evaluation of patient's condition, obtaining history from patient or surrogate, review of old charts and ventilator management      Truddie Hidden, MD Jun 04, 2020 2020

## 2020-06-01 DEATH — deceased

## 2020-06-10 ENCOUNTER — Ambulatory Visit: Payer: BC Managed Care – PPO | Admitting: Oncology

## 2020-06-10 ENCOUNTER — Other Ambulatory Visit: Payer: BC Managed Care – PPO

## 2020-06-10 ENCOUNTER — Ambulatory Visit: Payer: BC Managed Care – PPO

## 2020-06-17 ENCOUNTER — Ambulatory Visit: Payer: Self-pay | Admitting: Radiation Oncology

## 2020-07-26 NOTE — Progress Notes (Signed)
  Patient Name: Marcus Callahan MRN: [id number] DOB: 23-Jul-1970 Referring Physician: Agustina Caroli Date of Service: 06/26/2020 [facility], [place]                                                        End Of Treatment Note  Diagnoses: C79.31-Secondary malignant neoplasm of brain  Cancer Staging:  STAGE IV - brain metastases  Intent: Palliative  Radiation Treatment Dates: 04/28/2020 through 05/13/2020  Site Technique Total Dose (Gy) Dose per Fx (Gy) Completed Fx Beam Energies  Brain: Brain_whole Complex 30/35 2.5 12/14 6X    Narrative: The patient tolerated radiation therapy but unfortunately has a clinical decline toward the end of his course and was brought to the ED with multisystem organ failure due to metastatic cancer. He died on 06-07-20.   Plan: A note of condolence was sent to the patient's family.  -----------------------------------  Eppie Gibson, MD

## 2020-08-08 ENCOUNTER — Ambulatory Visit: Payer: BC Managed Care – PPO | Admitting: Emergency Medicine

## 2023-05-23 NOTE — Telephone Encounter (Signed)
Telephone call
